# Patient Record
Sex: Female | Born: 1965 | Race: White | Hispanic: No | Smoking: Former smoker
Health system: Southern US, Community
[De-identification: ages and names within clinical notes are randomized; demographics above are authoritative.]

## PROBLEM LIST (undated history)

## (undated) DIAGNOSIS — F419 Anxiety disorder, unspecified: Secondary | ICD-10-CM

## (undated) DIAGNOSIS — J449 Chronic obstructive pulmonary disease, unspecified: Secondary | ICD-10-CM

## (undated) DIAGNOSIS — G43909 Migraine, unspecified, not intractable, without status migrainosus: Secondary | ICD-10-CM

## (undated) DIAGNOSIS — K219 Gastro-esophageal reflux disease without esophagitis: Secondary | ICD-10-CM

## (undated) DIAGNOSIS — F32A Depression, unspecified: Secondary | ICD-10-CM

## (undated) DIAGNOSIS — I10 Essential (primary) hypertension: Secondary | ICD-10-CM

## (undated) DIAGNOSIS — J45909 Unspecified asthma, uncomplicated: Secondary | ICD-10-CM

## (undated) DIAGNOSIS — F329 Major depressive disorder, single episode, unspecified: Secondary | ICD-10-CM

## (undated) DIAGNOSIS — C801 Malignant (primary) neoplasm, unspecified: Secondary | ICD-10-CM

## (undated) HISTORY — PX: ABDOMINAL HYSTERECTOMY: SHX81

## (undated) HISTORY — PX: OTHER SURGICAL HISTORY: SHX169

---

## 2015-07-08 IMAGING — CR DG ANKLE COMPLETE 3+V*R*
3 series · 3 of 3 positions shown · non-contrast
Comparison: None.

CLINICAL DATA: Patient fell yesterday.

EXAM:
RIGHT ANKLE - COMPLETE 3+ VIEW

[x ankle ap right]
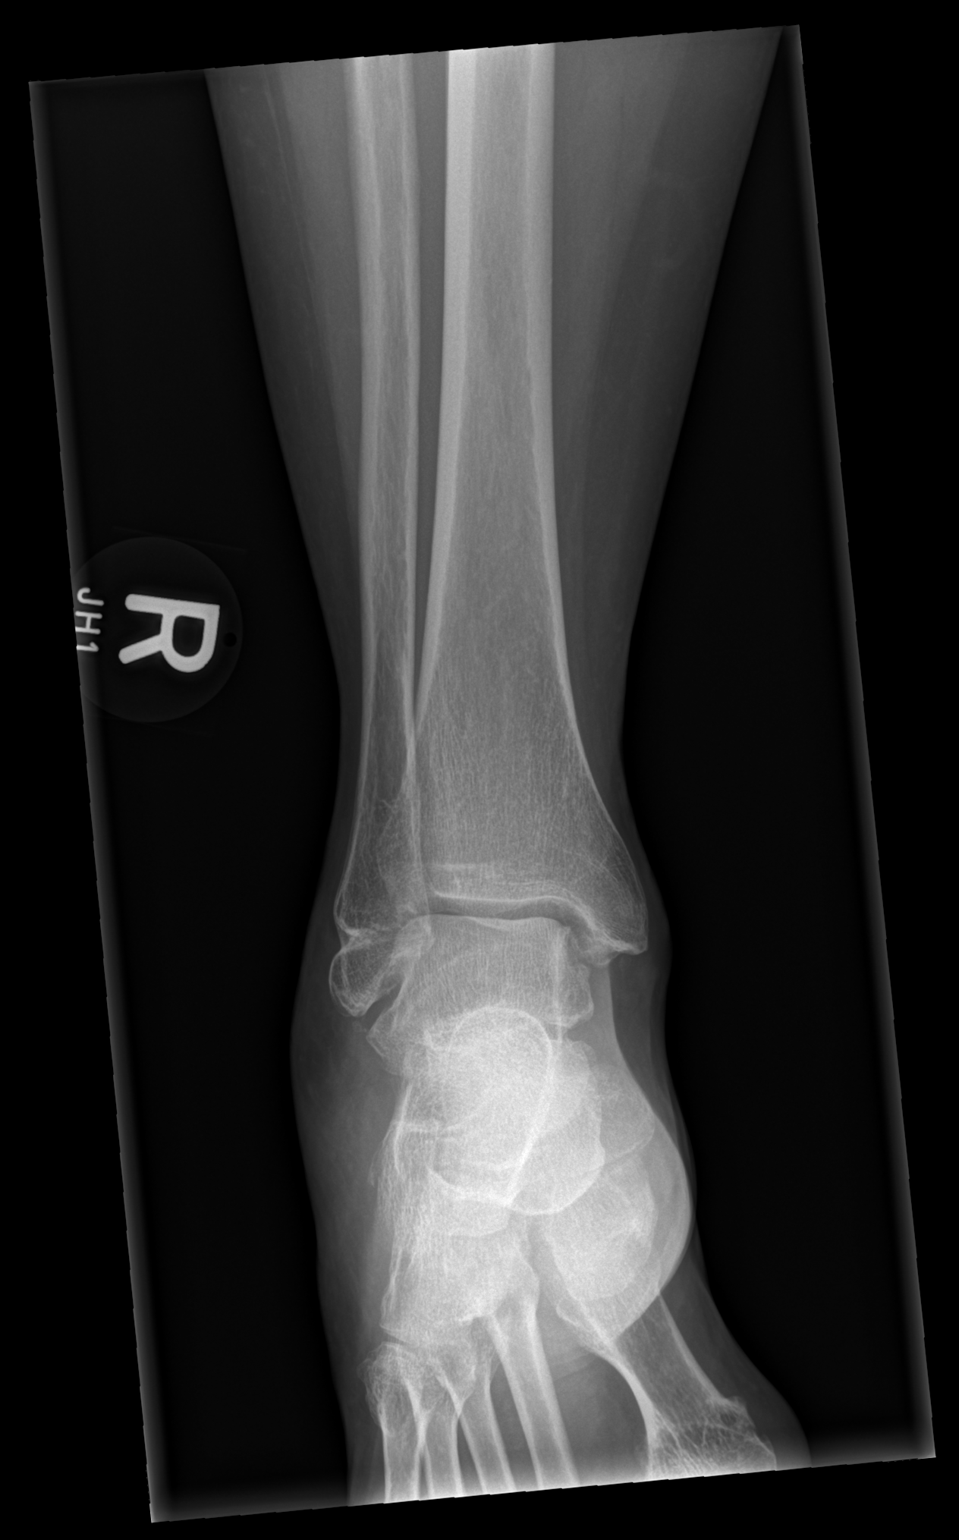

[x ankle obl right]
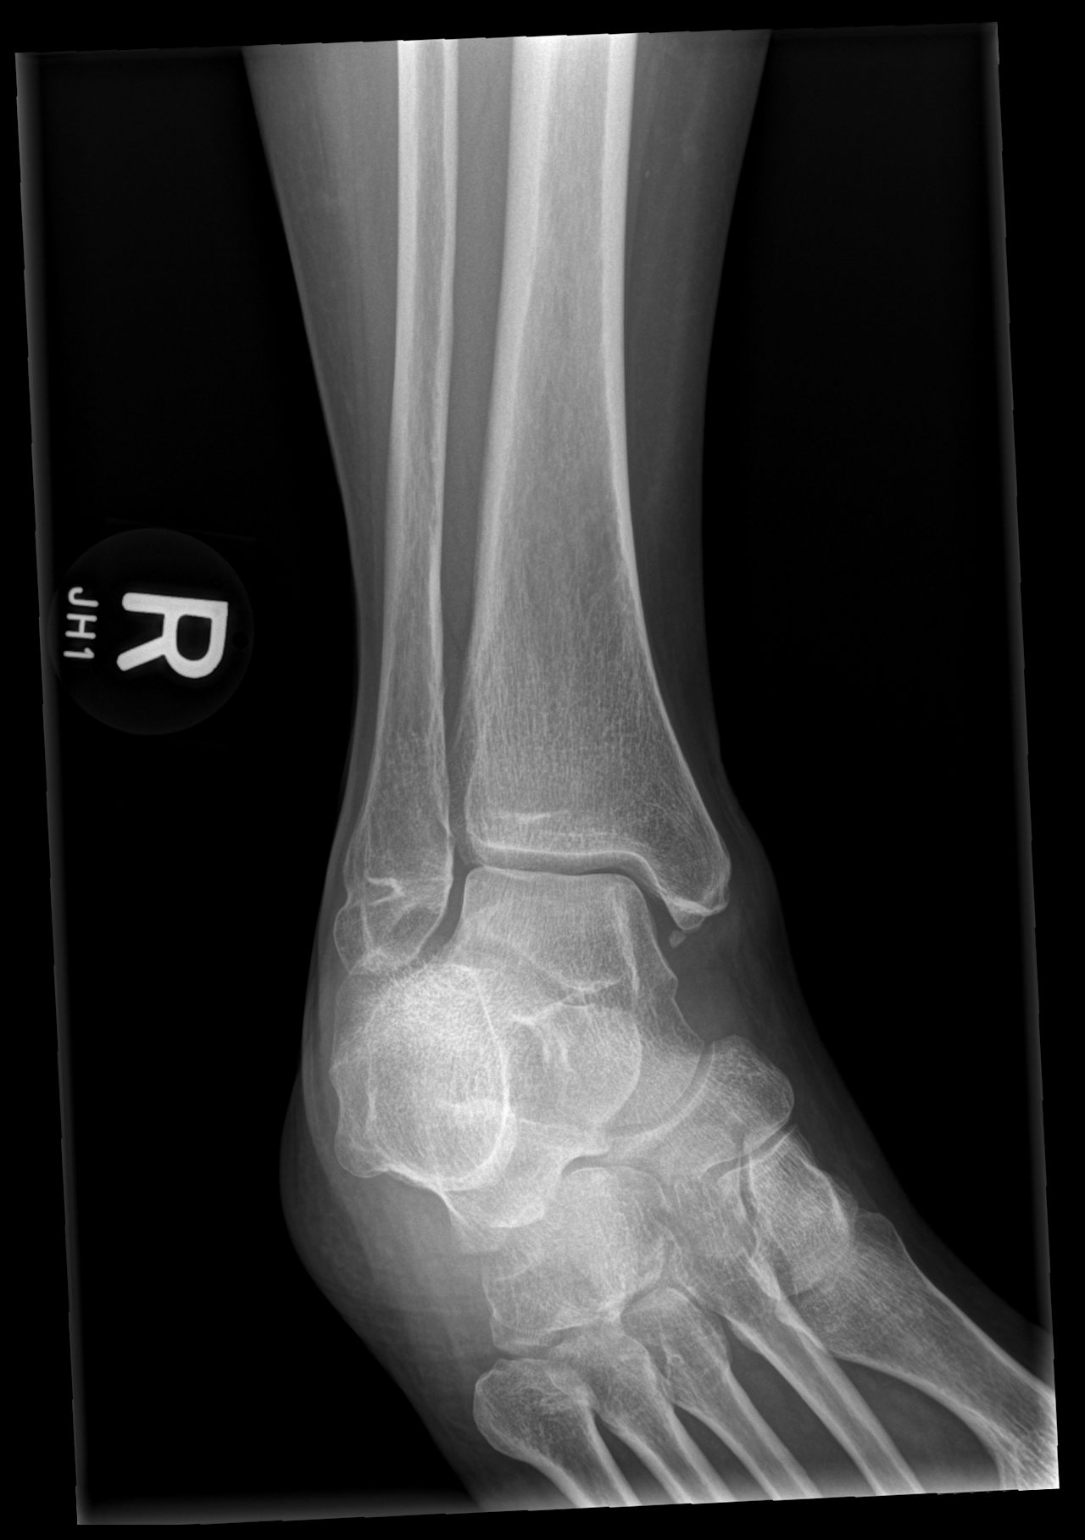

[x ankle lat right]
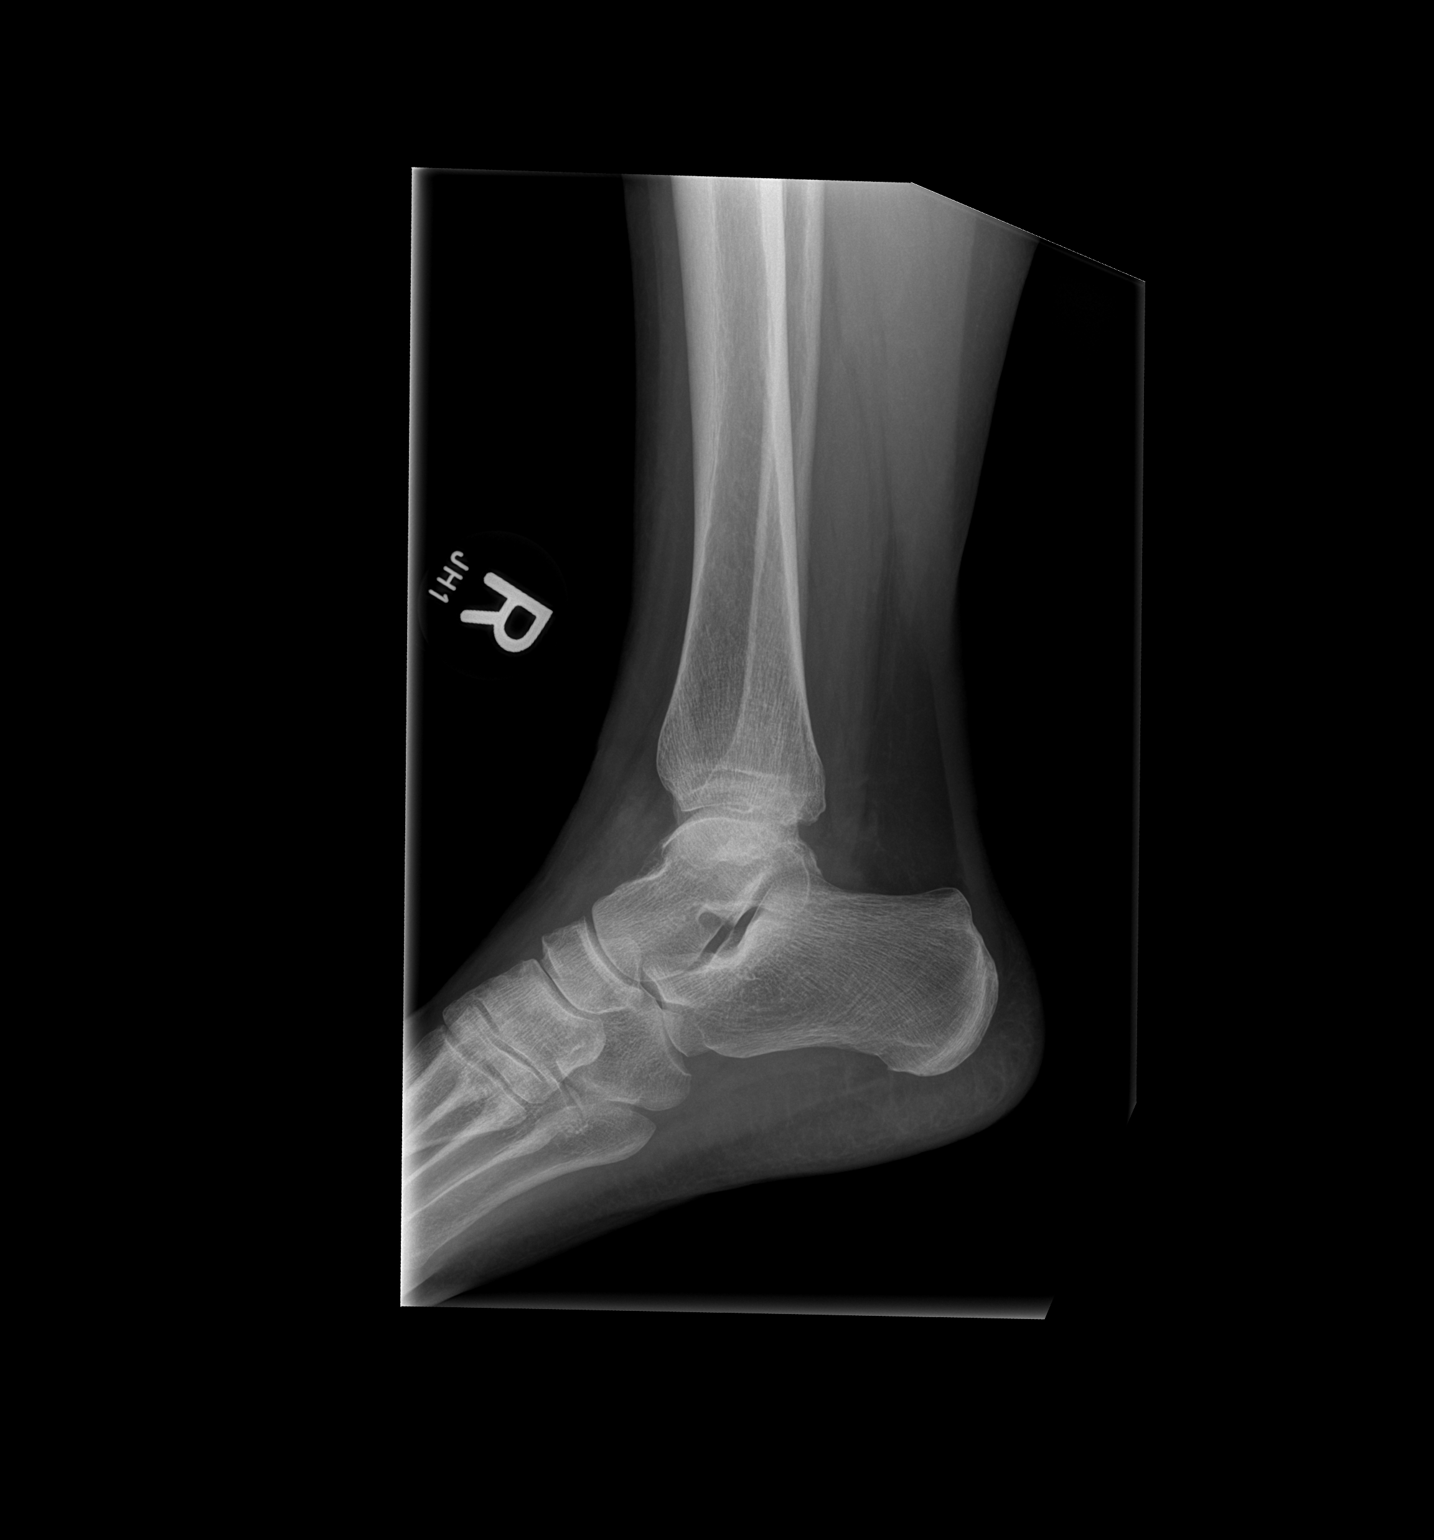

[3 of 3 positions shown; findings below may reference images not displayed]

FINDINGS: No evidence for an acute fracture. No subluxation or dislocation.
Deformity of the distal fibula suggests remote injury. Degenerative
changes are noted in the posterior tibiotalar joint.
IMPRESSION: No acute bony abnormality.

## 2016-01-23 ENCOUNTER — Inpatient Hospital Stay (HOSPITAL_COMMUNITY)
Admission: EM | Admit: 2016-01-23 | Discharge: 2016-01-27 | DRG: 194 | Disposition: A | Discharge status: Home / Self Care | Payer: Medicaid Other | Attending: Internal Medicine | Admitting: Internal Medicine

## 2016-01-23 ENCOUNTER — Encounter (HOSPITAL_COMMUNITY): Payer: Self-pay

## 2016-01-23 ENCOUNTER — Emergency Department (HOSPITAL_COMMUNITY): Payer: Medicaid Other

## 2016-01-23 DIAGNOSIS — F909 Attention-deficit hyperactivity disorder, unspecified type: Secondary | ICD-10-CM | POA: Diagnosis present

## 2016-01-23 DIAGNOSIS — E876 Hypokalemia: Secondary | ICD-10-CM | POA: Diagnosis present

## 2016-01-23 DIAGNOSIS — G43019 Migraine without aura, intractable, without status migrainosus: Secondary | ICD-10-CM | POA: Diagnosis not present

## 2016-01-23 DIAGNOSIS — J180 Bronchopneumonia, unspecified organism: Principal | ICD-10-CM | POA: Diagnosis present

## 2016-01-23 DIAGNOSIS — J189 Pneumonia, unspecified organism: Secondary | ICD-10-CM | POA: Diagnosis not present

## 2016-01-23 DIAGNOSIS — H6123 Impacted cerumen, bilateral: Secondary | ICD-10-CM | POA: Diagnosis present

## 2016-01-23 DIAGNOSIS — Z79899 Other long term (current) drug therapy: Secondary | ICD-10-CM

## 2016-01-23 DIAGNOSIS — C3490 Malignant neoplasm of unspecified part of unspecified bronchus or lung: Secondary | ICD-10-CM | POA: Diagnosis present

## 2016-01-23 DIAGNOSIS — H7293 Unspecified perforation of tympanic membrane, bilateral: Secondary | ICD-10-CM | POA: Diagnosis not present

## 2016-01-23 DIAGNOSIS — R509 Fever, unspecified: Secondary | ICD-10-CM

## 2016-01-23 DIAGNOSIS — C7931 Secondary malignant neoplasm of brain: Secondary | ICD-10-CM | POA: Diagnosis present

## 2016-01-23 DIAGNOSIS — Y95 Nosocomial condition: Secondary | ICD-10-CM | POA: Diagnosis present

## 2016-01-23 DIAGNOSIS — F329 Major depressive disorder, single episode, unspecified: Secondary | ICD-10-CM | POA: Diagnosis present

## 2016-01-23 DIAGNOSIS — F419 Anxiety disorder, unspecified: Secondary | ICD-10-CM | POA: Diagnosis present

## 2016-01-23 DIAGNOSIS — G43909 Migraine, unspecified, not intractable, without status migrainosus: Secondary | ICD-10-CM | POA: Diagnosis present

## 2016-01-23 DIAGNOSIS — J440 Chronic obstructive pulmonary disease with acute lower respiratory infection: Secondary | ICD-10-CM | POA: Diagnosis present

## 2016-01-23 DIAGNOSIS — I10 Essential (primary) hypertension: Secondary | ICD-10-CM | POA: Diagnosis present

## 2016-01-23 DIAGNOSIS — Z87891 Personal history of nicotine dependence: Secondary | ICD-10-CM | POA: Diagnosis not present

## 2016-01-23 DIAGNOSIS — K219 Gastro-esophageal reflux disease without esophagitis: Secondary | ICD-10-CM | POA: Diagnosis present

## 2016-01-23 HISTORY — DX: Chronic obstructive pulmonary disease, unspecified: J44.9

## 2016-01-23 HISTORY — DX: Essential (primary) hypertension: I10

## 2016-01-23 HISTORY — DX: Anxiety disorder, unspecified: F41.9

## 2016-01-23 HISTORY — DX: Malignant (primary) neoplasm, unspecified: C80.1

## 2016-01-23 HISTORY — DX: Major depressive disorder, single episode, unspecified: F32.9

## 2016-01-23 HISTORY — DX: Unspecified asthma, uncomplicated: J45.909

## 2016-01-23 HISTORY — DX: Gastro-esophageal reflux disease without esophagitis: K21.9

## 2016-01-23 HISTORY — DX: Depression, unspecified: F32.A

## 2016-01-23 LAB — COMPREHENSIVE METABOLIC PANEL
ALT: 14 U/L (ref 14–54)
AST: 17 U/L (ref 15–41)
Albumin: 3.7 g/dL (ref 3.5–5.0)
Alkaline Phosphatase: 72 U/L (ref 38–126)
Anion gap: 9 (ref 5–15)
BUN: 9 mg/dL (ref 6–20)
CO2: 24 mmol/L (ref 22–32)
Calcium: 8.1 mg/dL — ABNORMAL LOW (ref 8.9–10.3)
Chloride: 104 mmol/L (ref 101–111)
Creatinine, Ser: 0.75 mg/dL (ref 0.44–1.00)
GFR calc Af Amer: >60 mL/min (ref >60)
GFR calc non Af Amer: >60 mL/min (ref >60)
Glucose, Bld: 100 mg/dL — ABNORMAL HIGH (ref 65–99)
Potassium: 3.1 mmol/L — ABNORMAL LOW (ref 3.5–5.1)
Sodium: 137 mmol/L (ref 135–145)
Total Bilirubin: 0.5 mg/dL (ref 0.3–1.2)
Total Protein: 7.2 g/dL (ref 6.5–8.1)

## 2016-01-23 LAB — TROPONIN I: Troponin I: <0.03 ng/mL (ref <0.03)

## 2016-01-23 LAB — URINE MICROSCOPIC-ADD ON
Bacteria, UA: NONE SEEN
RBC / HPF: NONE SEEN RBC/hpf (ref 0–5)
WBC, UA: NONE SEEN WBC/hpf (ref 0–5)

## 2016-01-23 LAB — EXPECTORATED SPUTUM ASSESSMENT W REFEX TO RESP CULTURE

## 2016-01-23 LAB — CBC WITH DIFFERENTIAL/PLATELET
Basophils Absolute: 0 10*3/uL (ref 0.0–0.1)
Basophils Relative: 0 %
Eosinophils Absolute: 0 10*3/uL (ref 0.0–0.7)
Eosinophils Relative: 0 %
HCT: 42.6 % (ref 36.0–46.0)
Hemoglobin: 13.9 g/dL (ref 12.0–15.0)
Lymphocytes Relative: 8 %
Lymphs Abs: 0.7 10*3/uL (ref 0.7–4.0)
MCH: 31 pg (ref 26.0–34.0)
MCHC: 32.6 g/dL (ref 30.0–36.0)
MCV: 95.1 fL (ref 78.0–100.0)
Monocytes Absolute: 0.7 10*3/uL (ref 0.1–1.0)
Monocytes Relative: 8 %
Neutro Abs: 7.6 10*3/uL (ref 1.7–7.7)
Neutrophils Relative %: 84 %
Platelets: 295 10*3/uL (ref 150–400)
RBC: 4.48 MIL/uL (ref 3.87–5.11)
RDW: 13.2 % (ref 11.5–15.5)
WBC: 9 10*3/uL (ref 4.0–10.5)

## 2016-01-23 LAB — URINALYSIS, ROUTINE W REFLEX MICROSCOPIC
Bilirubin Urine: NEGATIVE
Glucose, UA: NEGATIVE mg/dL
Hgb urine dipstick: NEGATIVE
Ketones, ur: NEGATIVE mg/dL
Leukocytes, UA: NEGATIVE
Nitrite: NEGATIVE
Protein, ur: 30 mg/dL — AB
Specific Gravity, Urine: 1.022 (ref 1.005–1.030)
pH: 6.5 (ref 5.0–8.0)

## 2016-01-23 LAB — I-STAT CG4 LACTIC ACID, ED: Lactic Acid, Venous: 0.87 mmol/L (ref 0.5–1.9)

## 2016-01-23 IMAGING — CR DG CHEST 2V
2 series · 2 of 2 positions shown · non-contrast
Comparison: None.

CLINICAL DATA: Productive cough, fever and headache.

EXAM:
CHEST  2 VIEW

[w chest lat]
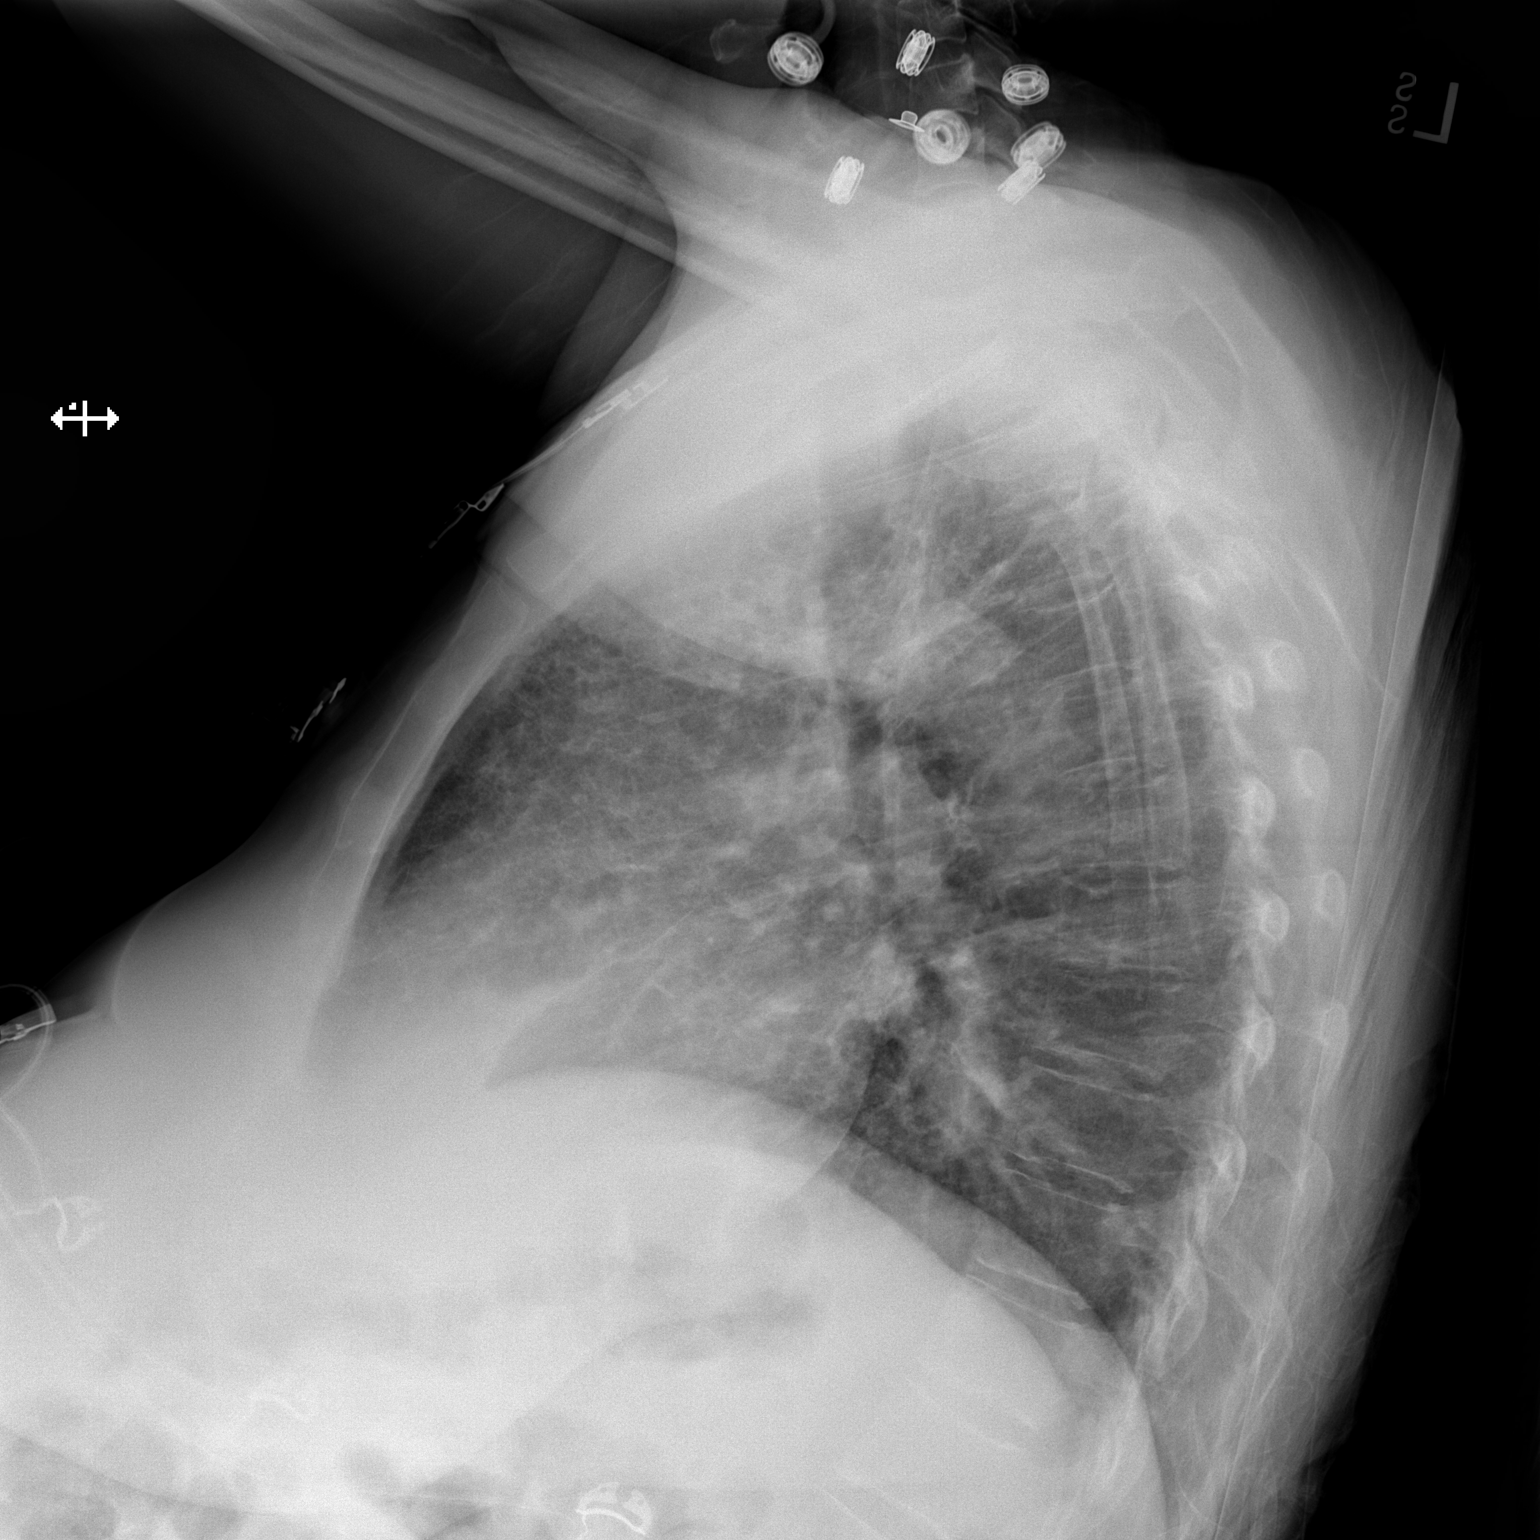

[x chest ap]
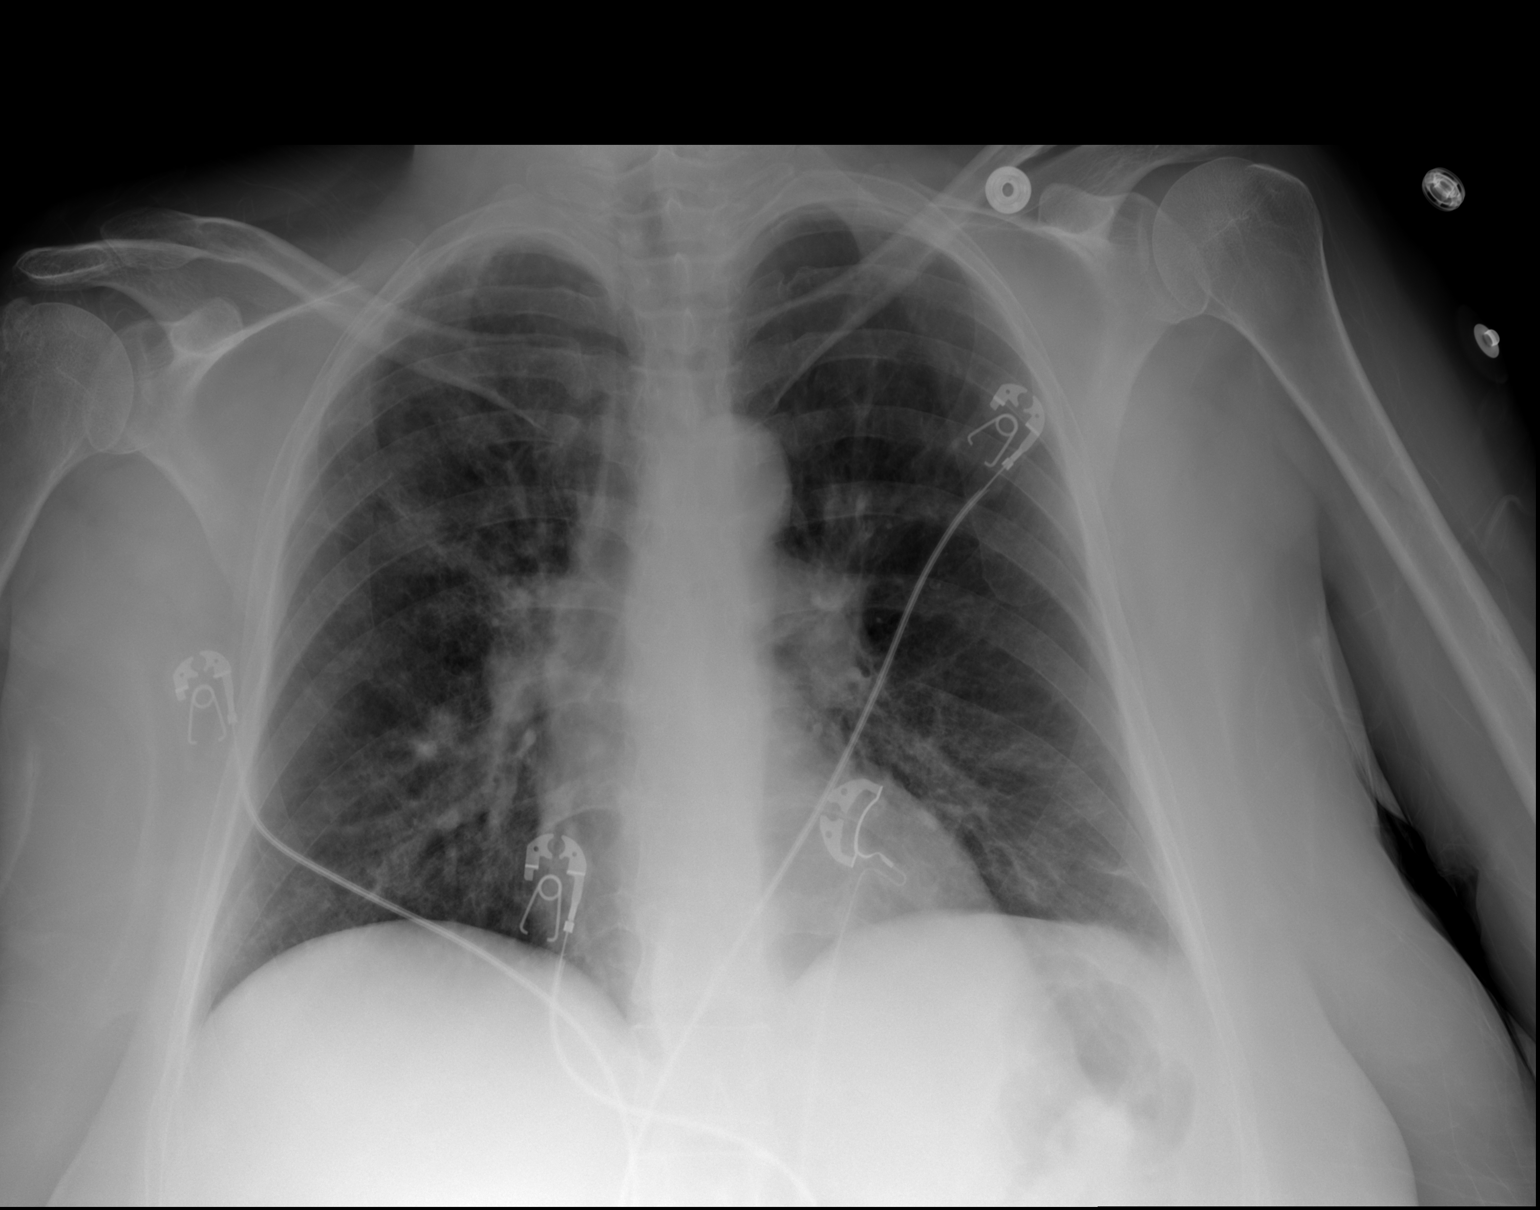

[2 of 2 positions shown; findings below may reference images not displayed]

FINDINGS: Heart size is normal. Mediastinal shadows are normal. There is
patchy bronchopneumonia throughout both lungs. No dense
consolidation or lobar collapse. No effusion. No acute bone finding.
IMPRESSION: Patchy bilateral bronchopneumonia.

## 2016-01-23 MED ORDER — PIPERACILLIN-TAZOBACTAM 3.375 G IVPB
3.3750 g | Freq: Three times a day (TID) | INTRAVENOUS | Status: DC
  Administered 2016-01-23 – 2016-01-27 (×11): 3.375 g via INTRAVENOUS
  Filled 2016-01-23 (×11): qty 50

## 2016-01-23 MED ORDER — DEXTROSE 5 % IV SOLN
1.0000 g | Freq: Three times a day (TID) | INTRAVENOUS | Status: DC
  Filled 2016-01-23 (×2): qty 1

## 2016-01-23 MED ORDER — SODIUM CHLORIDE 0.9 % IV BOLUS (SEPSIS)
500.0000 mL | Freq: Once | INTRAVENOUS | Status: AC
  Administered 2016-01-23: 500 mL via INTRAVENOUS

## 2016-01-23 MED ORDER — IBUPROFEN 200 MG PO TABS
600.0000 mg | ORAL_TABLET | Freq: Once | ORAL | Status: AC
  Administered 2016-01-23: 600 mg via ORAL
  Filled 2016-01-23: qty 3

## 2016-01-23 MED ORDER — FENTANYL CITRATE (PF) 100 MCG/2ML IJ SOLN
50.0000 ug | Freq: Once | INTRAMUSCULAR | Status: AC
  Administered 2016-01-23: 50 ug via INTRAVENOUS
  Filled 2016-01-23: qty 2

## 2016-01-23 MED ORDER — DM-GUAIFENESIN ER 30-600 MG PO TB12: 1.0000 | ORAL_TABLET | Freq: Two times a day (BID) | ORAL | Status: DC | PRN

## 2016-01-23 MED ORDER — METOPROLOL SUCCINATE ER 25 MG PO TB24
25.0000 mg | ORAL_TABLET | Freq: Every day | ORAL | Status: DC
  Administered 2016-01-23 – 2016-01-27 (×5): 25 mg via ORAL
  Filled 2016-01-23 (×5): qty 1

## 2016-01-23 MED ORDER — LORAZEPAM 0.5 MG PO TABS
0.5000 mg | ORAL_TABLET | Freq: Four times a day (QID) | ORAL | Status: DC | PRN
  Administered 2016-01-24 – 2016-01-26 (×4): 0.5 mg via ORAL
  Filled 2016-01-23 (×4): qty 1

## 2016-01-23 MED ORDER — ALBUTEROL SULFATE (2.5 MG/3ML) 0.083% IN NEBU
2.5000 mg | INHALATION_SOLUTION | RESPIRATORY_TRACT | Status: DC | PRN
  Administered 2016-01-23 – 2016-01-24 (×2): 2.5 mg via RESPIRATORY_TRACT
  Filled 2016-01-23 (×2): qty 3

## 2016-01-23 MED ORDER — BUPROPION HCL ER (SR) 100 MG PO TB12
200.0000 mg | ORAL_TABLET | Freq: Two times a day (BID) | ORAL | Status: DC
  Administered 2016-01-23 – 2016-01-27 (×8): 200 mg via ORAL
  Filled 2016-01-23 (×10): qty 2

## 2016-01-23 MED ORDER — OSELTAMIVIR PHOSPHATE 75 MG PO CAPS
75.0000 mg | ORAL_CAPSULE | Freq: Every day | ORAL | Status: DC
  Administered 2016-01-23: 75 mg via ORAL
  Filled 2016-01-23: qty 1

## 2016-01-23 MED ORDER — OXYCODONE-ACETAMINOPHEN 5-325 MG PO TABS
2.0000 | ORAL_TABLET | ORAL | Status: DC | PRN
  Administered 2016-01-23 – 2016-01-27 (×17): 2 via ORAL
  Filled 2016-01-23 (×17): qty 2

## 2016-01-23 MED ORDER — VANCOMYCIN HCL 10 G IV SOLR
1250.0000 mg | Freq: Two times a day (BID) | INTRAVENOUS | Status: DC
  Administered 2016-01-24 – 2016-01-25 (×4): 1250 mg via INTRAVENOUS
  Filled 2016-01-23: qty 500
  Filled 2016-01-23 (×3): qty 1250

## 2016-01-23 MED ORDER — ENOXAPARIN SODIUM 40 MG/0.4ML ~~LOC~~ SOLN
40.0000 mg | SUBCUTANEOUS | Status: DC
  Administered 2016-01-23 – 2016-01-26 (×4): 40 mg via SUBCUTANEOUS
  Filled 2016-01-23 (×4): qty 0.4

## 2016-01-23 MED ORDER — SODIUM CHLORIDE 0.9 % IV BOLUS (SEPSIS)
1000.0000 mL | Freq: Once | INTRAVENOUS | Status: AC
  Administered 2016-01-23: 1000 mL via INTRAVENOUS

## 2016-01-23 MED ORDER — POTASSIUM CHLORIDE CRYS ER 20 MEQ PO TBCR
40.0000 meq | EXTENDED_RELEASE_TABLET | Freq: Once | ORAL | Status: AC
  Administered 2016-01-23: 40 meq via ORAL
  Filled 2016-01-23: qty 2

## 2016-01-23 MED ORDER — ONDANSETRON HCL 4 MG/2ML IJ SOLN
4.0000 mg | Freq: Once | INTRAMUSCULAR | Status: AC
  Administered 2016-01-23: 4 mg via INTRAVENOUS
  Filled 2016-01-23: qty 2

## 2016-01-23 MED ORDER — PIPERACILLIN-TAZOBACTAM 3.375 G IVPB: 3.3750 g | Freq: Three times a day (TID) | INTRAVENOUS | Status: DC

## 2016-01-23 MED ORDER — MONTELUKAST SODIUM 10 MG PO TABS
10.0000 mg | ORAL_TABLET | Freq: Every day | ORAL | Status: DC
  Administered 2016-01-23 – 2016-01-27 (×5): 10 mg via ORAL
  Filled 2016-01-23 (×5): qty 1

## 2016-01-23 MED ORDER — VANCOMYCIN HCL IN DEXTROSE 1-5 GM/200ML-% IV SOLN
1000.0000 mg | Freq: Once | INTRAVENOUS | Status: AC
  Administered 2016-01-23: 1000 mg via INTRAVENOUS
  Filled 2016-01-23 (×2): qty 200

## 2016-01-23 MED ORDER — OXYCODONE-ACETAMINOPHEN 5-325 MG PO TABS
2.0000 | ORAL_TABLET | Freq: Four times a day (QID) | ORAL | Status: DC | PRN
  Administered 2016-01-23: 2 via ORAL
  Filled 2016-01-23: qty 2

## 2016-01-23 MED ORDER — PIPERACILLIN-TAZOBACTAM 3.375 G IVPB 30 MIN
3.3750 g | Freq: Once | INTRAVENOUS | Status: AC
  Administered 2016-01-23: 3.375 g via INTRAVENOUS
  Filled 2016-01-23: qty 50

## 2016-01-23 MED ORDER — VANCOMYCIN HCL 10 G IV SOLR: 1250.0000 mg | Freq: Two times a day (BID) | INTRAVENOUS | Status: DC

## 2016-01-23 MED ORDER — PANTOPRAZOLE SODIUM 40 MG PO TBEC
40.0000 mg | DELAYED_RELEASE_TABLET | Freq: Every day | ORAL | Status: DC
  Administered 2016-01-23 – 2016-01-27 (×5): 40 mg via ORAL
  Filled 2016-01-23 (×5): qty 1

## 2016-01-23 NOTE — ED Provider Notes (Signed)
[facility] DEPT Provider Note   CSN: [id number] Arrival date & time: 01/23/16  1153     History   Chief Complaint Chief Complaint  Patient presents with  . ca patient  . URI    HPI Ashley Mayo is a 50 y.o. female.  50 year old female with past medical history including lung cancer on chemotherapy, COPD, hypertension who presents with cough, fever, and malaise. 4 days ago, the patient began having a productive cough associated with intermittent fevers, headache, right ear pain, and chest pain with coughing. She reports mild progressive shortness of breath. She notes that a few family members were sick with a cough but no one had severe symptoms like hers. She last took Tylenol and Percocet this morning. No abdominal pain, vomiting, or diarrhea. No rash. She receives her care in [place], [place] and is currently visiting here for the holidays.   The history is provided by the patient.    Past Medical History:  Diagnosis Date  . Anxiety   . Asthma   . Cancer ([place])    lung cancer  . COPD (chronic obstructive pulmonary disease) ([place])   . Depression   . GERD (gastroesophageal reflux disease)   . Hypertension     Patient Active Problem List   Diagnosis Date Noted  . Pneumonia 01/23/2016    Past Surgical History:  Procedure Laterality Date  . ABDOMINAL HYSTERECTOMY    . CESAREAN SECTION    . ORIF right hip fracture      OB History    No data available       Home Medications    Prior to Admission medications   Medication Sig Start Date End Date Taking? Authorizing Provider  acetaminophen (TYLENOL) 500 MG tablet Take 1,000 mg by mouth every 6 (six) hours as needed for mild pain, moderate pain, fever or headache.   Yes Historical Provider, MD  ADDERALL XR 30 MG 24 hr capsule Take 30 mg by mouth daily. 01/08/16  Yes Historical Provider, MD  ADVAIR DISKUS 500-50 MCG/DOSE AEPB Take 1 puff by mouth 2 (two) times daily as needed (for shortness of breath).   12/20/15  Yes Historical Provider, MD  albuterol (PROVENTIL) (2.5 MG/3ML) 0.083% nebulizer solution Take 2.5 mg by nebulization every 3 (three) hours as needed for wheezing or shortness of breath.   Yes Historical Provider, MD  amphetamine-dextroamphetamine (ADDERALL) 10 MG tablet Take 20 mg by mouth daily. 01/08/16  Yes Historical Provider, MD  buPROPion (WELLBUTRIN SR) 200 MG 12 hr tablet Take 200 mg by mouth 2 (two) times daily.   Yes Historical Provider, MD  dextromethorphan-guaiFENesin (MUCINEX DM) 30-600 MG 12hr tablet Take 1 tablet by mouth 2 (two) times daily as needed for cough.   Yes Historical Provider, MD  lidocaine (LIDODERM) 5 % Place 1-2 patches onto the skin daily. 01/13/16  Yes Historical Provider, MD  LORazepam (ATIVAN) 0.5 MG tablet Take 0.5 mg by mouth every 6 (six) hours as needed for anxiety. 12/26/15  Yes Historical Provider, MD  methylPREDNISolone (MEDROL DOSEPAK) 4 MG TBPK tablet Take by mouth as directed. 01/07/16  Yes Historical Provider, MD  metoprolol succinate (TOPROL-XL) 25 MG 24 hr tablet Take 25 mg by mouth daily.   Yes Historical Provider, MD  montelukast (SINGULAIR) 10 MG tablet Take 10 mg by mouth daily.   Yes Historical Provider, MD  nystatin (MYCOSTATIN) 100000 UNIT/ML suspension Take 5 mLs by mouth 4 (four) times daily as needed (for thrush).  11/01/15  Yes Historical Provider, MD  oxyCODONE-acetaminophen (PERCOCET/ROXICET) 5-325 MG tablet Take 2 tablets by mouth every 4 (four) hours as needed for pain. 12/26/15  Yes Historical Provider, MD  pantoprazole (PROTONIX) 40 MG tablet Take 40 mg by mouth daily. 01/06/16  Yes Historical Provider, MD  pembrolizumab (KEYTRUDA) 100 MG/4ML SOLN Inject 2 mg/kg into the vein every 21 ( twenty-one) days.   Yes Historical Provider, MD  PROAIR HFA 108 [phone] Base) MCG/ACT inhaler Take 2 puffs by mouth 2 (two) times daily as needed for wheezing or shortness of breath. 01/10/16  Yes Historical Provider, MD  valACYclovir (VALTREX) 500 MG  tablet Take 500 mg by mouth 2 (two) times daily as needed (for cold sores).  11/19/15  Yes Historical Provider, MD    Family History History reviewed. No pertinent family history.  Social History Social History  Substance Use Topics  . Smoking status: Former Smoker    Packs/day: 0.25    Years: 35.00    Types: Cigarettes  . Smokeless tobacco: Never Used  . Alcohol use No     Allergies   Codeine and Shellfish allergy   Review of Systems Review of Systems 10 Systems reviewed and are negative for acute change except as noted in the HPI.   Physical Exam Updated Vital Signs BP 108/65   Pulse 110   Temp 102 F (38.9 C) (Oral)   Resp 16   Ht '5\' 4"'$  (1.626 m)   Wt 180 lb (81.6 kg)   SpO2 95%   BMI 30.90 kg/m   Physical Exam  Constitutional: She is oriented to person, place, and time. She appears well-developed and well-nourished. No distress.  uncomfortable  HENT:  Head: Normocephalic and atraumatic.  Mouth/Throat: Oropharynx is clear and moist.  Moist mucous membranes  Eyes: Conjunctivae are normal. Pupils are equal, round, and reactive to light.  Neck: Neck supple.  Cardiovascular: Regular rhythm and normal heart sounds.  Tachycardia present.   No murmur heard. Pulmonary/Chest: Effort normal.  Rhonchi b/l  Abdominal: Soft. Bowel sounds are normal. She exhibits no distension. There is no tenderness.  Musculoskeletal: She exhibits no edema.  Neurological: She is alert and oriented to person, place, and time.  Fluent speech  Skin: Skin is warm and dry.  Psychiatric: She has a normal mood and affect. Judgment normal.  Nursing note and vitals reviewed.    ED Treatments / Results  Labs (all labs ordered are listed, but only abnormal results are displayed) Labs Reviewed  COMPREHENSIVE METABOLIC PANEL - Abnormal; Notable for the following:       Result Value   Potassium 3.1 (*)    Glucose, Bld 100 (*)    Calcium 8.1 (*)    All other components within normal  limits  URINALYSIS, ROUTINE W REFLEX MICROSCOPIC (NOT AT [facility]) - Abnormal; Notable for the following:    Protein, ur 30 (*)    All other components within normal limits  URINE MICROSCOPIC-ADD ON - Abnormal; Notable for the following:    Squamous Epithelial / LPF 6-30 (*)    All other components within normal limits  CULTURE, BLOOD (ROUTINE X 2)  CULTURE, BLOOD (ROUTINE X 2)  URINE CULTURE  RESPIRATORY PANEL BY PCR  CBC WITH DIFFERENTIAL/PLATELET  TROPONIN I  CREATININE, SERUM  I-STAT CG4 LACTIC ACID, ED    EKG  EKG Interpretation  Date/Time:  Thursday January 23 2016 12:38:03 EST Ventricular Rate:  113 PR Interval:    QRS Duration: 85 QT Interval:  294 QTC Calculation: 403 R Axis:  43 Text Interpretation:  Sinus tachycardia Left atrial enlargement Borderline repolarization abnormality diffuse T wave inversions and ST depression, no previous EKG available Confirmed by Haily Caley MD, Isolde Skaff [phone]) on 01/23/2016 1:48:25 PM       Radiology Dg Chest 2 View  Result Date: 01/23/2016 CLINICAL DATA:  Productive cough, fever and headache. EXAM: CHEST  2 VIEW COMPARISON:  None. FINDINGS: Heart size is normal. Mediastinal shadows are normal. There is patchy bronchopneumonia throughout both lungs. No dense consolidation or lobar collapse. No effusion. No acute bone finding. IMPRESSION: Patchy bilateral bronchopneumonia. Electronically Signed   By: Nelson Chimes M.D.   On: 01/23/2016 13:53    Procedures .Critical Care Performed by: Sharlett Iles Authorized by: Sharlett Iles   Critical care provider statement:    Critical care time (minutes):  35   Critical care time was exclusive of:  Separately billable procedures and treating other patients   Critical care was necessary to treat or prevent imminent or life-threatening deterioration of the following conditions:  Sepsis   Critical care was time spent personally by me on the following activities:  Development of  treatment plan with patient or surrogate, evaluation of patient's response to treatment, examination of patient, obtaining history from patient or surrogate, ordering and performing treatments and interventions, ordering and review of laboratory studies, ordering and review of radiographic studies and re-evaluation of patient's condition   (including critical care time)  Medications Ordered in ED Medications  oseltamivir (TAMIFLU) capsule 75 mg (75 mg Oral Given 01/23/16 1414)  piperacillin-tazobactam (ZOSYN) IVPB 3.375 g (not administered)  vancomycin (VANCOCIN) 1,250 mg in sodium chloride 0.9 % 250 mL IVPB (not administered)  oxyCODONE-acetaminophen (PERCOCET/ROXICET) 5-325 MG per tablet 2 tablet (not administered)  sodium chloride 0.9 % bolus 1,000 mL (0 mLs Intravenous Stopped 01/23/16 1447)    And  sodium chloride 0.9 % bolus 1,000 mL (0 mLs Intravenous Stopped 01/23/16 1709)    And  sodium chloride 0.9 % bolus 500 mL (0 mLs Intravenous Stopped 01/23/16 1709)  piperacillin-tazobactam (ZOSYN) IVPB 3.375 g (0 g Intravenous Stopped 01/23/16 1417)  vancomycin (VANCOCIN) IVPB 1000 mg/200 mL premix (0 mg Intravenous Stopped 01/23/16 1545)  ondansetron (ZOFRAN) injection 4 mg (4 mg Intravenous Given 01/23/16 1332)  ibuprofen (ADVIL,MOTRIN) tablet 600 mg (600 mg Oral Given 01/23/16 1439)  fentaNYL (SUBLIMAZE) injection 50 mcg (50 mcg Intravenous Given 01/23/16 1439)     Initial Impression / Assessment and Plan / ED Course  I have reviewed the triage vital signs and the nursing notes.  Pertinent labs & imaging results that were available during my care of the patient were reviewed by me and considered in my medical decision making (see chart for details).  Clinical Course    PT w/ lung cancer on chemo p/w 4 days of URI sx Including cough, malaise, headache, and intermittent fevers. She spiked a fever shortly after arrival and I initiated a code sepsis with IV fluids, blood and urine  cultures, vancomycin and Zosyn. Her vital signs were otherwise stable with no hypotension. No respiratory distress on exam. No rash. Initial lactate was normal. I also gave Tamiflu and sent respiratory viral panel as differential includes influenza.  CBC, BMP and troponin are reassuring. EKG with some T-wave inversions and ST depressions diffusely, we have no previous available for comparison. Patient without ongoing chest pain, she states it mainly occurs when she is coughing. Chest x-ray shows bilateral bronchopneumonia. Patient's vital signs have remained stable on reexamination. Discussed admission with [facility], Dr. Lonny Prude, appreciate assistance of the patient's care. Patient admitted for further treatment. Final Clinical Impressions(s) / ED Diagnoses   Final diagnoses:  Healthcare-associated pneumonia  Fever, unspecified fever cause    New Prescriptions New Prescriptions   No medications on file     Sharlett Iles, MD 01/23/16 [phone]

## 2016-01-23 NOTE — Progress Notes (Signed)
Pharmacy Antibiotic Note  Ashley Mayo is a 50 y.o. female admitted on 01/23/2016 with  Pharmacy has been consulted for dosing of vancomycin and Zosyn for r/o pulmonary infection.   Patient received vancomycin 1 gram and Zosyn 3.375 grams in ED  Plan:  1. Vancomycin '1250mg'$  IV q12h.    (Opted for this maintenance regimen rather than nomogram dosage of 1 gram IV q8h due to increased risk of nephrotoxicity in setting of concomitant Zosyn) 2. Zosyn 3.375 grams IV q8h 3. Follow renal function, culture results, clinical course 4. Consider de-escalation of empiric regimen when feasible   Height: '5\' 4"'$  (162.6 cm) Weight: 180 lb (81.6 kg) IBW/kg (Calculated) : 54.7  Temp (24hrs), Avg:99.4 F (37.4 C), Min:99.4 F (37.4 C), Max:99.4 F (37.4 C)   Recent Labs Lab 01/23/16 1300 01/23/16 1308  WBC 9.0  --   CREATININE 0.75  --   LATICACIDVEN  --  0.87    Estimated Creatinine Clearance: 87 mL/min (by C-G formula based on SCr of 0.75 mg/dL).    Allergies  Allergen Reactions  . Codeine Hives    Antimicrobials this admission: 11/30 >> Zosyn 11/30 >> vancomycin 11/30 >> Tamiflu x 1    Dose adjustments this admission: --  Microbiology results: 11/30 BCx x2: collected 11/30 UCx: ordered 11/30 Resp panel by PCR: ordered   Thank you for allowing pharmacy to be a part of this patient's care.  Clayburn Pert, PharmD, BCPS Pager: [phone] 01/23/2016  2:13 PM

## 2016-01-23 NOTE — ED Triage Notes (Signed)
Per EMS, pt is from home with complaints productive cough, fever, headache, ear drainage, nausea, and pain with coughing. Pt received '4mg'$  zofran intravenously by EMS. Pt is currently being treated for lung cancer. Last tx was 3 weeks ago.

## 2016-01-23 NOTE — ED Notes (Signed)
Bed: WA17 Expected date: 01/23/16 Expected time: 11:48 AM Means of arrival: Ambulance Comments: [place] pt congestion, fever, green secretions

## 2016-01-23 NOTE — H&P (Signed)
History and Physical    Ashley Mayo EXH:[id number] DOB: 07-Mar-1965 DOA: 01/23/2016  PCP: No primary care provider on file.  Patient coming from: Home  Chief Complaint: Dyspnea  HPI: Ashley Mayo is a 50 y.o. female with medical history significant of anxiety, depression, metastatic lung cancer, hypertension. Three days ago, patient describes developing a cough. Symptoms worsened with developing sputum, fevers, chills, and diaphoresis. She had associated head congestion and reports seeing discharge from her right ear. She took Mucinex to help with getting phlegm up and benadryl for her head congestion. Mucinex helped. She was also taking prednisone for some wheezing recently. She receives her medical care in [place] and is down in [place] for the recent holidays.  ED Course: Vitals: Febrile to 102 degrees farenheit. Slightly tachycardic to 110s. Normotensive. On room air. Labs: Potassium of 3.1. WBC of 9K Imaging: Chest x-ray significant for bilateral pneumonia Medications/Course: Vanc/zosyn; 2.5L NS bolus  Review of Systems: Review of Systems  Constitutional: Positive for chills, diaphoresis, fever and malaise/fatigue.  HENT: Positive for ear discharge.   Respiratory: Positive for cough, sputum production, shortness of breath and wheezing. Negative for hemoptysis.   Cardiovascular: Positive for chest pain (chronic). Negative for palpitations, leg swelling and PND.  Gastrointestinal: Positive for constipation and nausea. Negative for abdominal pain, diarrhea and vomiting.  All other systems reviewed and are negative.   Past Medical History:  Diagnosis Date  . Anxiety   . Asthma   . Cancer ([place])    lung cancer  . COPD (chronic obstructive pulmonary disease) ([place])   . Depression   . GERD (gastroesophageal reflux disease)   . Hypertension     Past Surgical History:  Procedure Laterality Date  . ABDOMINAL HYSTERECTOMY    . CESAREAN SECTION    . ORIF right hip  fracture       reports that she has quit smoking. Her smoking use included Cigarettes. She has a 8.75 pack-year smoking history. She has never used smokeless tobacco. She reports that she does not drink alcohol or use drugs.  Allergies  Allergen Reactions  . Codeine Hives  . Shellfish Allergy Hives    Family History  Problem Relation Age of Onset  . Breast cancer Mother     Prior to Admission medications   Medication Sig Start Date End Date Taking? Authorizing Provider  acetaminophen (TYLENOL) 500 MG tablet Take 1,000 mg by mouth every 6 (six) hours as needed for mild pain, moderate pain, fever or headache.   Yes Historical Provider, MD  ADDERALL XR 30 MG 24 hr capsule Take 30 mg by mouth daily. 01/08/16  Yes Historical Provider, MD  ADVAIR DISKUS 500-50 MCG/DOSE AEPB Take 1 puff by mouth 2 (two) times daily as needed (for shortness of breath).  12/20/15  Yes Historical Provider, MD  albuterol (PROVENTIL) (2.5 MG/3ML) 0.083% nebulizer solution Take 2.5 mg by nebulization every 3 (three) hours as needed for wheezing or shortness of breath.   Yes Historical Provider, MD  amphetamine-dextroamphetamine (ADDERALL) 10 MG tablet Take 20 mg by mouth daily. 01/08/16  Yes Historical Provider, MD  buPROPion (WELLBUTRIN SR) 200 MG 12 hr tablet Take 200 mg by mouth 2 (two) times daily.   Yes Historical Provider, MD  dextromethorphan-guaiFENesin (MUCINEX DM) 30-600 MG 12hr tablet Take 1 tablet by mouth 2 (two) times daily as needed for cough.   Yes Historical Provider, MD  lidocaine (LIDODERM) 5 % Place 1-2 patches onto the skin daily. 01/13/16  Yes Historical Provider, MD  LORazepam (  ATIVAN) 0.5 MG tablet Take 0.5 mg by mouth every 6 (six) hours as needed for anxiety. 12/26/15  Yes Historical Provider, MD  methylPREDNISolone (MEDROL DOSEPAK) 4 MG TBPK tablet Take by mouth as directed. 01/07/16  Yes Historical Provider, MD  metoprolol succinate (TOPROL-XL) 25 MG 24 hr tablet Take 25 mg by mouth daily.    Yes Historical Provider, MD  montelukast (SINGULAIR) 10 MG tablet Take 10 mg by mouth daily.   Yes Historical Provider, MD  nystatin (MYCOSTATIN) 100000 UNIT/ML suspension Take 5 mLs by mouth 4 (four) times daily as needed (for thrush).  11/01/15  Yes Historical Provider, MD  oxyCODONE-acetaminophen (PERCOCET/ROXICET) 5-325 MG tablet Take 2 tablets by mouth every 4 (four) hours as needed for pain. 12/26/15  Yes Historical Provider, MD  pantoprazole (PROTONIX) 40 MG tablet Take 40 mg by mouth daily. 01/06/16  Yes Historical Provider, MD  pembrolizumab (KEYTRUDA) 100 MG/4ML SOLN Inject 2 mg/kg into the vein every 21 ( twenty-one) days.   Yes Historical Provider, MD  PROAIR HFA 108 [phone] Base) MCG/ACT inhaler Take 2 puffs by mouth 2 (two) times daily as needed for wheezing or shortness of breath. 01/10/16  Yes Historical Provider, MD  valACYclovir (VALTREX) 500 MG tablet Take 500 mg by mouth 2 (two) times daily as needed (for cold sores).  11/19/15  Yes Historical Provider, MD    Physical Exam: Vitals:   01/23/16 1211 01/23/16 1414 01/23/16 1415 01/23/16 1600  BP:  118/86  108/65  Pulse:  (!) 123  110  Resp:  22  16  Temp:   102 F (38.9 C)   TempSrc:   Oral   SpO2:  94%  95%  Weight: 81.6 kg (180 lb)     Height: '5\' 4"'$  (1.626 m)        Constitutional: NAD, calm, comfortable Eyes: PERRL, lids and conjunctivae normal ENMT: Mucous membranes are moist. Posterior pharynx clear of any exudate or lesions. Left TM is perforated. Right TM is inflammed Neck: normal, supple, no masses, no thyromegaly Respiratory: Crackles and decreased breath sounds bilaterally in mid to lower lung fields. Normal respiratory effort. No accessory muscle use.  Cardiovascular: Regular rate and rhythm, no murmurs / rubs / gallops. No extremity edema. 2+ pedal pulses. No carotid bruits.  Abdomen: no tenderness, no masses palpated. Bowel sounds positive.  Musculoskeletal: no clubbing / cyanosis. No joint deformity upper and  lower extremities. Good ROM, no contractures. Normal muscle tone.  Skin: no rashes, lesions, ulcers. No induration Neurologic: CN 2-12 grossly intact. Sensation intact, DTR normal. Strength 5/5 in all 4.  Psychiatric: Normal judgment and insight. Alert and oriented x 3. Normal mood.    Labs on Admission: I have personally reviewed following labs and imaging studies  CBC:  Recent Labs Lab 01/23/16 1300  WBC 9.0  NEUTROABS 7.6  HGB 13.9  HCT 42.6  MCV 95.1  PLT 308   Basic Metabolic Panel:  Recent Labs Lab 01/23/16 1300  NA 137  K 3.1*  CL 104  CO2 24  GLUCOSE 100*  BUN 9  CREATININE 0.75  CALCIUM 8.1*   GFR: Estimated Creatinine Clearance: 87 mL/min (by C-G formula based on SCr of 0.75 mg/dL). Liver Function Tests:  Recent Labs Lab 01/23/16 1300  AST 17  ALT 14  ALKPHOS 72  BILITOT 0.5  PROT 7.2  ALBUMIN 3.7   No results for input(s): LIPASE, AMYLASE in the last 168 hours. No results for input(s): AMMONIA in the last 168 hours. Coagulation Profile:  No results for input(s): INR, PROTIME in the last 168 hours. Cardiac Enzymes:  Recent Labs Lab 01/23/16 1300  TROPONINI <0.03   BNP (last 3 results) No results for input(s): PROBNP in the last 8760 hours. HbA1C: No results for input(s): HGBA1C in the last 72 hours. CBG: No results for input(s): GLUCAP in the last 168 hours. Lipid Profile: No results for input(s): CHOL, HDL, LDLCALC, TRIG, CHOLHDL, LDLDIRECT in the last 72 hours. Thyroid Function Tests: No results for input(s): TSH, T4TOTAL, FREET4, T3FREE, THYROIDAB in the last 72 hours. Anemia Panel: No results for input(s): VITAMINB12, FOLATE, FERRITIN, TIBC, IRON, RETICCTPCT in the last 72 hours. Urine analysis:    Component Value Date/Time   COLORURINE YELLOW 01/23/2016 [place] 01/23/2016 1355   LABSPEC 1.022 01/23/2016 1355   PHURINE 6.5 01/23/2016 [place] 01/23/2016 1355   HGBUR NEGATIVE 01/23/2016 [place] 01/23/2016 1355   [place] 01/23/2016 1355   PROTEINUR 30 (A) 01/23/2016 1355   NITRITE NEGATIVE 01/23/2016 1355   LEUKOCYTESUR NEGATIVE 01/23/2016 1355   Sepsis Labs: !!!!!!!!!!!!!!!!!!!!!!!!!!!!!!!!!!!!!!!!!!!! '@LABRCNTIP'$ (procalcitonin:4,lacticidven:4) )No results found for this or any previous visit (from the past 240 hour(s)).   Radiological Exams on Admission: Dg Chest 2 View  Result Date: 01/23/2016 CLINICAL DATA:  Productive cough, fever and headache. EXAM: CHEST  2 VIEW COMPARISON:  None. FINDINGS: Heart size is normal. Mediastinal shadows are normal. There is patchy bronchopneumonia throughout both lungs. No dense consolidation or lobar collapse. No effusion. No acute bone finding. IMPRESSION: Patchy bilateral bronchopneumonia. Electronically Signed   By: Nelson Chimes M.D.   On: 01/23/2016 13:53    EKG: Independently reviewed. Sinus tachycardia, inverted T-waves, ST depression in lateral leads  Assessment/Plan Active Problems:   Pneumonia  HCAP Sepsis Will treat as HCAP. Sepsis resolved by the time I evaluated the patient. -continue vancomycin/zosyn -urine strep/legionella -sputum culture/gram stain -blood cultures -Mucinex  Hypokalemia -kdur 1mq -recheck in AM  Depression -continue Wellbutrin  Anxiety -continue ativan  Chronic chest pain Secondary to lung mass. Troponin negative. EKG significant for diffusely inverted T-waves with nothing to compare. -repeat EKG -continue Percocet  Lung cancer with brain mets [place] -[place] Adderall  Hypertension -continue metoprolol    DVT prophylaxis: Lovenox Code Status: Full code Family Communication: Son at bedside Disposition Plan: Anticipate discharge home in 2-3 days Consults called: None Admission status: Inpatient, telemetry   RCordelia Poche MD [facility] Pager [phone] If 7PM-7AM, please contact  night-coverage [email or web address] Password [email or web address]  01/23/2016, 5:42 PM

## 2016-01-23 NOTE — Progress Notes (Signed)
Pt confirms with ED CM she is visiting her children in [place] for thanksgiving and will be returning today to [place] Pt with [employer] and states she goes to the cancer center at [facility] for oncology services in [place].   Pt did states she may later return to reside in [place]  CM spoke with pt about her medicaid of [place] covering only [place] emergency services for possible a brief time and with it she would be uninsured [employer] resident with no pcp.  CM discussed and provided written information to assist pt with determining choice for uninsured accepting pcps, discussed the importance of pcp vs EDP services for f/u care, www.needymeds.org, www.goodrx.com, discounted pharmacies and other [employer] such as [employer], affordable care act, financial assistance, uninsured dental services,  med assist, DSS and  health department  Reviewed resources for [employer] uninsured accepting pcps like [facility], family medicine at [employer], [facility], [facility], local urgent care centers, [facility], [facility] family practice, general medical clinics, family services of the [place], [facility] urgent care plus others, medication resources, [facility] out [facility]ies and housing Pt voiced understanding and appreciation of resources provided  Provided [facility] contact information

## 2016-01-24 DIAGNOSIS — C7931 Secondary malignant neoplasm of brain: Secondary | ICD-10-CM

## 2016-01-24 DIAGNOSIS — C3490 Malignant neoplasm of unspecified part of unspecified bronchus or lung: Secondary | ICD-10-CM

## 2016-01-24 DIAGNOSIS — J189 Pneumonia, unspecified organism: Secondary | ICD-10-CM

## 2016-01-24 DIAGNOSIS — G43019 Migraine without aura, intractable, without status migrainosus: Secondary | ICD-10-CM

## 2016-01-24 LAB — RESPIRATORY PANEL BY PCR

## 2016-01-24 LAB — BASIC METABOLIC PANEL
Anion gap: 8 (ref 5–15)
BUN: 10 mg/dL (ref 6–20)
CO2: 22 mmol/L (ref 22–32)
Calcium: 7.4 mg/dL — ABNORMAL LOW (ref 8.9–10.3)
Chloride: 108 mmol/L (ref 101–111)
Creatinine, Ser: 0.86 mg/dL (ref 0.44–1.00)
GFR calc Af Amer: >60 mL/min (ref >60)
GFR calc non Af Amer: >60 mL/min (ref >60)
Glucose, Bld: 92 mg/dL (ref 65–99)
Potassium: 3.5 mmol/L (ref 3.5–5.1)
Sodium: 138 mmol/L (ref 135–145)

## 2016-01-24 LAB — URINE CULTURE: Culture: <10000 — AB

## 2016-01-24 LAB — HIV ANTIBODY (ROUTINE TESTING W REFLEX): HIV Screen 4th Generation wRfx: NONREACTIVE

## 2016-01-24 LAB — STREP PNEUMONIAE URINARY ANTIGEN: Strep Pneumo Urinary Antigen: NEGATIVE

## 2016-01-24 LAB — PROCALCITONIN: Procalcitonin: 0.1 ng/mL

## 2016-01-24 MED ORDER — SUMATRIPTAN SUCCINATE 50 MG PO TABS
50.0000 mg | ORAL_TABLET | Freq: Once | ORAL | Status: DC
  Filled 2016-01-24: qty 1

## 2016-01-24 MED ORDER — SUMATRIPTAN SUCCINATE 6 MG/0.5ML ~~LOC~~ SOLN
6.0000 mg | Freq: Once | SUBCUTANEOUS | Status: AC
  Administered 2016-01-24: 6 mg via SUBCUTANEOUS
  Filled 2016-01-24: qty 0.5

## 2016-01-24 MED ORDER — SUMATRIPTAN SUCCINATE 6 MG/0.5ML ~~LOC~~ SOLN
6.0000 mg | SUBCUTANEOUS | Status: AC | PRN
  Administered 2016-01-24: 6 mg via SUBCUTANEOUS
  Filled 2016-01-24: qty 0.5

## 2016-01-24 MED ORDER — MORPHINE SULFATE (PF) 2 MG/ML IV SOLN
2.0000 mg | INTRAVENOUS | Status: DC | PRN
  Administered 2016-01-24 (×3): 2 mg via INTRAVENOUS
  Filled 2016-01-24 (×3): qty 1

## 2016-01-24 NOTE — Progress Notes (Addendum)
PROGRESS NOTE  Ashley Mayo ACZ:[id number] DOB: 1965/11/11 DOA: 01/23/2016 PCP: No primary care provider on file.  HPI/Recap of past 24 hours: 50 yo female w/past med hx pf metastatic lung cancer & HTN from [place] who presented to the ER with fevers, chills, cough with sputum & right ear pain w/?discharge.  Found to have pneumonia. Patient limited to the hospitalist service and started on antibiotics and IV fluids, treated as healthcare associated pneumonia. Respiratory virus panel positive for metapneumovirus.  By hospital day 2, patient feeling only a little bit better. Her main complaint is of severe headache, she says she suffers from migraines and this is similar to that. She also complains of continued cough with greenish sputum and some right ear pain.  Addendum: This evening, was able to evaluate patient's ear canals with an otoscope. Right ear canal noted trace amount of blood and large amount of cerumen. Left ear canal patients that she could not hear anything, however no pain noted a large amount of blood membrane if any poorly visualized. Suspect the patient has bilateral membrane rupture. We'll discuss with ENT.  Assessment/Plan: Principal Problem:   Healthcare-associated pneumonia: Patient on IV cefepime and vancomycin. Does not look to be a postobstructive pneumonia. White blood cell count stable. No further fevers. Pro calcitonin level normal. 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 She is not currently on any steroids. Stable for now.    Essential hypertension: Continue home medications  Migraine headache: When necessary Imitrex     Code Status: Full code   Family Communication: Spoke with brother on the phone   Disposition Plan: Home in the next few days once her coughing  improved and breathing stabilized.    Consultants:  None   Procedures:  None   Antimicrobials:   IV Zosyn and vancomycin 11/30-present  DVT prophylaxis: Lovenox   Objective: Vitals:   01/23/16 2130 01/24/16 0615 01/24/16 1349 01/24/16 1351  BP: 109/68 103/74 138/89   Pulse: (!) 101 88 76   Resp: '20 20 18   '$ Temp: 98.2 F (36.8 C) 98.7 F (37.1 C) 97.6 F (36.4 C)   TempSrc: Oral Oral Oral   SpO2: 96% 97% 100% 99%  Weight:      Height:        Intake/Output Summary (Last 24 hours) at 01/24/16 1551 Last data filed at 01/24/16 1200  Gross per 24 hour  Intake             1070 ml  Output                0 ml  Net             1070 ml   Filed Weights   01/23/16 1211 01/23/16 1901  Weight: 81.6 kg (180 lb) 81.2 kg (179 lb 1.6 oz)    Exam:   General:  Alert and oriented 3, mild distress secondary to headache   Cardiovascular: Regular rate and rhythm, S1 and S2   Respiratory: Mostly clear to auscultation, some decreased breath sounds right basilar   Abdomen: Soft, nontender, nondistended, positive bowel sounds   Musculoskeletal: No clubbing or cyanosis or edema   Skin: No skin breaks, tears or lesions  Psychiatry: Patient is appropriate, no evidence of psychoses    Data Reviewed: CBC:  Recent Labs Lab 01/23/16  1300  WBC 9.0  NEUTROABS 7.6  HGB 13.9  HCT 42.6  MCV 95.1  PLT 532   Basic Metabolic Panel:  Recent Labs Lab 01/23/16 1300 01/24/16 0455  NA 137 138  K 3.1* 3.5  CL 104 108  CO2 24 22  GLUCOSE 100* 92  BUN 9 10  CREATININE 0.75 0.86  CALCIUM 8.1* 7.4*   GFR: Estimated Creatinine Clearance: 79.8 mL/min (by C-G formula based on SCr of 0.86 mg/dL). Liver Function Tests:  Recent Labs Lab 01/23/16 1300  AST 17  ALT 14  ALKPHOS 72  BILITOT 0.5  PROT 7.2  ALBUMIN 3.7   No results for input(s): LIPASE, AMYLASE in the last 168 hours. No results for input(s): AMMONIA in the last 168 hours. Coagulation Profile: No results  for input(s): INR, PROTIME in the last 168 hours. Cardiac Enzymes:  Recent Labs Lab 01/23/16 1300  TROPONINI <0.03   BNP (last 3 results) No results for input(s): PROBNP in the last 8760 hours. HbA1C: No results for input(s): HGBA1C in the last 72 hours. CBG: No results for input(s): GLUCAP in the last 168 hours. Lipid Profile: No results for input(s): CHOL, HDL, LDLCALC, TRIG, CHOLHDL, LDLDIRECT in the last 72 hours. Thyroid Function Tests: No results for input(s): TSH, T4TOTAL, FREET4, T3FREE, THYROIDAB in the last 72 hours. Anemia Panel: No results for input(s): VITAMINB12, FOLATE, FERRITIN, TIBC, IRON, RETICCTPCT in the last 72 hours. Urine analysis:    Component Value Date/Time   COLORURINE YELLOW 01/23/2016 [place] 01/23/2016 1355   LABSPEC 1.022 01/23/2016 1355   PHURINE 6.5 01/23/2016 1355   GLUCOSEU NEGATIVE 01/23/2016 1355   HGBUR NEGATIVE 01/23/2016 [place] 01/23/2016 1355   KETONESUR NEGATIVE 01/23/2016 1355   PROTEINUR 30 (A) 01/23/2016 1355   NITRITE NEGATIVE 01/23/2016 1355   LEUKOCYTESUR NEGATIVE 01/23/2016 1355   Sepsis Labs: '@LABRCNTIP'$ (procalcitonin:4,lacticidven:4)  ) Recent Results (from the past 240 hour(s))  Blood Culture (routine x 2)     Status: None (Preliminary result)   Collection Time: 01/23/16 12:49 PM  Result Value Ref Range Status   Specimen Description BLOOD RIGHT ANTECUBITAL  Final   Special Requests BOTTLES DRAWN AEROBIC AND ANAEROBIC 5ML  Final   Culture   Final    NO GROWTH < 24 HOURS Performed at [facility]    Report Status PENDING  Incomplete  Blood Culture (routine x 2)     Status: None (Preliminary result)   Collection Time: 01/23/16  1:04 PM  Result Value Ref Range Status   Specimen Description BLOOD LEFT HAND  Final   Special Requests IN PEDIATRIC BOTTLE 3CC  Final   Culture   Final    NO GROWTH < 24 HOURS Performed at [facility]    Report Status PENDING   Incomplete  Urine culture     Status: Abnormal   Collection Time: 01/23/16  1:55 PM  Result Value Ref Range Status   Specimen Description URINE, CLEAN CATCH  Final   Special Requests NONE  Final   Culture (A)  Final    <10,000 COLONIES/mL INSIGNIFICANT GROWTH Performed at [facility]    Report Status 01/24/2016 FINAL  Final  Respiratory Panel by PCR     Status: Abnormal   Collection Time: 01/23/16  3:16 PM  Result Value Ref Range Status   Adenovirus NOT DETECTED NOT DETECTED Final   Coronavirus 229E NOT DETECTED NOT DETECTED Final   Coronavirus HKU1 NOT DETECTED NOT DETECTED  Final   Coronavirus NL63 NOT DETECTED NOT DETECTED Final   Coronavirus OC43 NOT DETECTED NOT DETECTED Final   Metapneumovirus DETECTED (A) NOT DETECTED Final   Rhinovirus / Enterovirus NOT DETECTED NOT DETECTED Final   Influenza A NOT DETECTED NOT DETECTED Final   Influenza B NOT DETECTED NOT DETECTED Final   Parainfluenza Virus 1 NOT DETECTED NOT DETECTED Final   Parainfluenza Virus 2 NOT DETECTED NOT DETECTED Final   Parainfluenza Virus 3 NOT DETECTED NOT DETECTED Final   Parainfluenza Virus 4 NOT DETECTED NOT DETECTED Final   Respiratory Syncytial Virus NOT DETECTED NOT DETECTED Final   Bordetella pertussis NOT DETECTED NOT DETECTED Final   Chlamydophila pneumoniae NOT DETECTED NOT DETECTED Final   Mycoplasma pneumoniae NOT DETECTED NOT DETECTED Final    Comment: Performed at [facility]  Culture, sputum-assessment     Status: None   Collection Time: 01/23/16  9:34 PM  Result Value Ref Range Status   Specimen Description EXPECTORATED SPUTUM  Final   Special Requests NONE  Final   Sputum evaluation   Final    THIS SPECIMEN IS ACCEPTABLE. RESPIRATORY CULTURE REPORT TO FOLLOW.   Report Status 01/23/2016 FINAL  Final  Culture, respiratory (NON-Expectorated)     Status: None (Preliminary result)   Collection Time: 01/23/16  9:34 PM  Result Value Ref Range Status   Specimen Description  SPUTUM  Final   Special Requests NONE  Final   Gram Stain   Final    ABUNDANT WBC PRESENT,BOTH PMN AND MONONUCLEAR RARE GRAM POSITIVE COCCI IN PAIRS RARE GRAM VARIABLE ROD Performed at [facility]    Culture PENDING  Incomplete   Report Status PENDING  Incomplete      Studies: No results found.  Scheduled Meds: . buPROPion  200 mg Oral BID  . enoxaparin (LOVENOX) injection  40 mg Subcutaneous Q24H  . metoprolol succinate  25 mg Oral Daily  . montelukast  10 mg Oral Daily  . pantoprazole  40 mg Oral Daily  . piperacillin-tazobactam (ZOSYN)  IV  3.375 g Intravenous Q8H  . vancomycin  1,250 mg Intravenous Q12H    Continuous Infusions:   LOS: 1 day    Annita Brod, MD [facility] Pager [phone]  If 7PM-7AM, please contact night-coverage [email or web address] Password [email or web address] 01/24/2016, 3:51 PM

## 2016-01-25 DIAGNOSIS — H7293 Unspecified perforation of tympanic membrane, bilateral: Secondary | ICD-10-CM

## 2016-01-25 LAB — LEGIONELLA PNEUMOPHILA SEROGP 1 UR AG: L. pneumophila Serogp 1 Ur Ag: NEGATIVE

## 2016-01-25 MED ORDER — ONDANSETRON HCL 4 MG/2ML IJ SOLN
4.0000 mg | Freq: Four times a day (QID) | INTRAMUSCULAR | Status: DC | PRN
  Administered 2016-01-25: 4 mg via INTRAVENOUS
  Filled 2016-01-25: qty 2

## 2016-01-25 MED ORDER — GUAIFENESIN-DM 100-10 MG/5ML PO SYRP
5.0000 mL | ORAL_SOLUTION | Freq: Four times a day (QID) | ORAL | Status: DC
  Administered 2016-01-25 – 2016-01-27 (×6): 5 mL via ORAL
  Filled 2016-01-25 (×7): qty 10

## 2016-01-25 MED ORDER — CIPROFLOXACIN-DEXAMETHASONE 0.3-0.1 % OT SUSP
4.0000 [drp] | Freq: Two times a day (BID) | OTIC | Status: DC
  Administered 2016-01-25 – 2016-01-27 (×5): 4 [drp] via OTIC
  Filled 2016-01-25: qty 7.5

## 2016-01-25 MED ORDER — SALINE SPRAY 0.65 % NA SOLN
1.0000 | NASAL | Status: DC | PRN
  Filled 2016-01-25: qty 44

## 2016-01-25 MED ORDER — GUAIFENESIN-CODEINE 100-10 MG/5ML PO SOLN: 5.0000 mL | Freq: Four times a day (QID) | ORAL | Status: DC

## 2016-01-25 MED ORDER — SUMATRIPTAN SUCCINATE 6 MG/0.5ML ~~LOC~~ SOLN
6.0000 mg | Freq: Once | SUBCUTANEOUS | Status: AC
  Administered 2016-01-25: 6 mg via SUBCUTANEOUS
  Filled 2016-01-25: qty 0.5

## 2016-01-25 NOTE — Progress Notes (Signed)
PROGRESS NOTE  Ashley Mayo RDE:[id number] DOB: Jul 14, 1965 DOA: 01/23/2016 PCP: Pcp Not In System  HPI/Recap of past 24 hours: 50 yo female w/past med hx pf metastatic lung cancer & HTN from [place] who presented to the ER with fevers, chills, cough with sputum & right ear pain w/?discharge.  Found to have pneumonia. Patient limited to the hospitalist service and started on antibiotics and IV fluids, treated as healthcare associated pneumonia. Respiratory virus panel positive for metapneumovirus.  By hospital day 2, patient feeling only a little bit better. Her main complaint is of severe headache, she says she suffers from migraines and this is similar to that. She also complains of continued cough with greenish sputum and some right ear pain. She says she cannot hear very much out of the left ear. Visualization with otoscope found blood in both ear canals, left greater than right.  This morning, patient had mild headache/migraine, but Imitrex helped. She still having significant cough, bilateral ear pain and overall feels very congested  Assessment/Plan: Principal Problem:   Healthcare-associated pneumonia: Patient on IV cefepime and vancomycin. Does not look to be a postobstructive pneumonia. White blood cell count stable. No further fevers. Pro calcitonin level normal. 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 She is not currently on any steroids. Stable for now.  Suspected perforated eardrum secondary to otitis media: Spoke with ENT. The antibiotics that she is on for her pneumonia should cover generalized infections but as per recommendations of also started Ciprodex. Upon discharge, patient can either follow-up with them or is okay to fly and  she can follow up with ENT in [place], which the patient prefers.    Essential hypertension: Continue home medications  Migraine headache: When necessary Imitrex   Code Status: Full code   Family Communication: Left message for son  Disposition Plan: Home in the next few days once her coughing improved and breathing stabilized.     Consultants:  Discussed with ENT   Procedures:  None   Antimicrobials:   IV Zosyn and vancomycin 11/30-present  DVT prophylaxis: Lovenox   Objective: Vitals:   01/24/16 1349 01/24/16 1351 01/24/16 2114 01/25/16 0553  BP: 138/89  109/78 124/78  Pulse: 76  83 74  Resp: '18  18 18  '$ Temp: 97.6 F (36.4 C)  98.4 F (36.9 C) 97.9 F (36.6 C)  TempSrc: Oral  Oral Oral  SpO2: 100% 99% 100% 97%  Weight:      Height:        Intake/Output Summary (Last 24 hours) at 01/25/16 1234 Last data filed at 01/25/16 0600  Gross per 24 hour  Intake              600 ml  Output                0 ml  Net              600 ml   Filed Weights   01/23/16 1211 01/23/16 1901  Weight: 81.6 kg (180 lb) 81.2 kg (179 lb 1.6 oz)    Exam:   General:  Alert and oriented 3, Mild distress secondary to coughing  Cardiovascular: Regular rate and rhythm, S1 and S2   Respiratory: Mostly clear to auscultation, some decreased breath  sounds right basilar , Upper airway noise  Abdomen: Soft, nontender, nondistended, positive bowel sounds   Musculoskeletal: No clubbing or cyanosis or edema   Skin: No skin breaks, tears or lesions  Psychiatry: Patient is appropriate, no evidence of psychoses    Data Reviewed: CBC:  Recent Labs Lab 01/23/16 1300  WBC 9.0  NEUTROABS 7.6  HGB 13.9  HCT 42.6  MCV 95.1  PLT 301   Basic Metabolic Panel:  Recent Labs Lab 01/23/16 1300 01/24/16 0455  NA 137 138  K 3.1* 3.5  CL 104 108  CO2 24 22  GLUCOSE 100* 92  BUN 9 10  CREATININE 0.75 0.86  CALCIUM 8.1* 7.4*   GFR: Estimated Creatinine Clearance: 79.8  mL/min (by C-G formula based on SCr of 0.86 mg/dL). Liver Function Tests:  Recent Labs Lab 01/23/16 1300  AST 17  ALT 14  ALKPHOS 72  BILITOT 0.5  PROT 7.2  ALBUMIN 3.7   No results for input(s): LIPASE, AMYLASE in the last 168 hours. No results for input(s): AMMONIA in the last 168 hours. Coagulation Profile: No results for input(s): INR, PROTIME in the last 168 hours. Cardiac Enzymes:  Recent Labs Lab 01/23/16 1300  TROPONINI <0.03   BNP (last 3 results) No results for input(s): PROBNP in the last 8760 hours. HbA1C: No results for input(s): HGBA1C in the last 72 hours. CBG: No results for input(s): GLUCAP in the last 168 hours. Lipid Profile: No results for input(s): CHOL, HDL, LDLCALC, TRIG, CHOLHDL, LDLDIRECT in the last 72 hours. Thyroid Function Tests: No results for input(s): TSH, T4TOTAL, FREET4, T3FREE, THYROIDAB in the last 72 hours. Anemia Panel: No results for input(s): VITAMINB12, FOLATE, FERRITIN, TIBC, IRON, RETICCTPCT in the last 72 hours. Urine analysis:    Component Value Date/Time   COLORURINE YELLOW 01/23/2016 [place] 01/23/2016 1355   LABSPEC 1.022 01/23/2016 1355   PHURINE 6.5 01/23/2016 1355   GLUCOSEU NEGATIVE 01/23/2016 1355   HGBUR NEGATIVE 01/23/2016 [place] 01/23/2016 1355   KETONESUR NEGATIVE 01/23/2016 1355   PROTEINUR 30 (A) 01/23/2016 1355   NITRITE NEGATIVE 01/23/2016 1355   LEUKOCYTESUR NEGATIVE 01/23/2016 1355   Sepsis Labs: '@LABRCNTIP'$ (procalcitonin:4,lacticidven:4)  ) Recent Results (from the past 240 hour(s))  Blood Culture (routine x 2)     Status: None (Preliminary result)   Collection Time: 01/23/16 12:49 PM  Result Value Ref Range Status   Specimen Description BLOOD RIGHT ANTECUBITAL  Final   Special Requests BOTTLES DRAWN AEROBIC AND ANAEROBIC 5ML  Final   Culture   Final    NO GROWTH < 24 HOURS Performed at [facility]    Report Status PENDING  Incomplete  Blood  Culture (routine x 2)     Status: None (Preliminary result)   Collection Time: 01/23/16  1:04 PM  Result Value Ref Range Status   Specimen Description BLOOD LEFT HAND  Final   Special Requests IN PEDIATRIC BOTTLE 3CC  Final   Culture   Final    NO GROWTH < 24 HOURS Performed at [facility]    Report Status PENDING  Incomplete  Urine culture     Status: Abnormal   Collection Time: 01/23/16  1:55 PM  Result Value Ref Range Status   Specimen Description URINE, CLEAN CATCH  Final   Special Requests NONE  Final   Culture (A)  Final    <10,000 COLONIES/mL INSIGNIFICANT GROWTH Performed at [facility]    Report Status 01/24/2016  FINAL  Final  Respiratory Panel by PCR     Status: Abnormal   Collection Time: 01/23/16  3:16 PM  Result Value Ref Range Status   Adenovirus NOT DETECTED NOT DETECTED Final   Coronavirus 229E NOT DETECTED NOT DETECTED Final   Coronavirus HKU1 NOT DETECTED NOT DETECTED Final   Coronavirus NL63 NOT DETECTED NOT DETECTED Final   Coronavirus OC43 NOT DETECTED NOT DETECTED Final   Metapneumovirus DETECTED (A) NOT DETECTED Final   Rhinovirus / Enterovirus NOT DETECTED NOT DETECTED Final   Influenza A NOT DETECTED NOT DETECTED Final   Influenza B NOT DETECTED NOT DETECTED Final   Parainfluenza Virus 1 NOT DETECTED NOT DETECTED Final   Parainfluenza Virus 2 NOT DETECTED NOT DETECTED Final   Parainfluenza Virus 3 NOT DETECTED NOT DETECTED Final   Parainfluenza Virus 4 NOT DETECTED NOT DETECTED Final   Respiratory Syncytial Virus NOT DETECTED NOT DETECTED Final   Bordetella pertussis NOT DETECTED NOT DETECTED Final   Chlamydophila pneumoniae NOT DETECTED NOT DETECTED Final   Mycoplasma pneumoniae NOT DETECTED NOT DETECTED Final    Comment: Performed at [facility]  Culture, sputum-assessment     Status: None   Collection Time: 01/23/16  9:34 PM  Result Value Ref Range Status   Specimen Description EXPECTORATED SPUTUM  Final   Special  Requests NONE  Final   Sputum evaluation   Final    THIS SPECIMEN IS ACCEPTABLE. RESPIRATORY CULTURE REPORT TO FOLLOW.   Report Status 01/23/2016 FINAL  Final  Culture, respiratory (NON-Expectorated)     Status: None (Preliminary result)   Collection Time: 01/23/16  9:34 PM  Result Value Ref Range Status   Specimen Description SPUTUM  Final   Special Requests NONE  Final   Gram Stain   Final    ABUNDANT WBC PRESENT,BOTH PMN AND MONONUCLEAR RARE GRAM POSITIVE COCCI IN PAIRS RARE GRAM VARIABLE ROD    Culture   Final    CULTURE REINCUBATED FOR BETTER GROWTH Performed at [facility]    Report Status PENDING  Incomplete      Studies: No results found.  Scheduled Meds: . buPROPion  200 mg Oral BID  . ciprofloxacin-dexamethasone  4 drop Both Ears BID  . enoxaparin (LOVENOX) injection  40 mg Subcutaneous Q24H  . metoprolol succinate  25 mg Oral Daily  . montelukast  10 mg Oral Daily  . pantoprazole  40 mg Oral Daily  . piperacillin-tazobactam (ZOSYN)  IV  3.375 g Intravenous Q8H  . vancomycin  1,250 mg Intravenous Q12H    Continuous Infusions:   LOS: 2 days    Annita Brod, MD [facility] Pager [phone]  If 7PM-7AM, please contact night-coverage [email or web address] Password [facility] 01/25/2016, 12:34 PM

## 2016-01-26 DIAGNOSIS — I10 Essential (primary) hypertension: Secondary | ICD-10-CM

## 2016-01-26 LAB — CULTURE, RESPIRATORY W GRAM STAIN: Culture: NORMAL

## 2016-01-26 LAB — PROCALCITONIN: Procalcitonin: <0.1 ng/mL

## 2016-01-26 LAB — CULTURE, RESPIRATORY

## 2016-01-26 MED ORDER — ALBUTEROL SULFATE (2.5 MG/3ML) 0.083% IN NEBU
2.5000 mg | INHALATION_SOLUTION | Freq: Four times a day (QID) | RESPIRATORY_TRACT | Status: DC
  Administered 2016-01-26 – 2016-01-27 (×5): 2.5 mg via RESPIRATORY_TRACT
  Filled 2016-01-26 (×5): qty 3

## 2016-01-26 NOTE — Progress Notes (Signed)
Pharmacy Antibiotic Note  Ashley Mayo is a 50 y.o. female admitted on 01/23/2016 with  Pharmacy has been consulted for dosing of vancomycin and Zosyn for r/o pulmonary infection.   Patient received vancomycin 1 gram and Zosyn 3.375 grams in ED.  Vancomycin stopped 12/2.  Today, 01/26/2016: Day #3 antibiotics  Virus panel detects metapneumovirus  PCT < 0.10  Renal: SCr WNL  WBC WNL  Plan:  Zosyn 3.375gm IV q8h over 4h infusion remains appropriate dose for renal function  Based on virus panel result and PCT < 0.1, consider stopping antibiotics as this appears to viral infection   Height: '5\' 4"'$  (162.6 cm) Weight: 179 lb 1.6 oz (81.2 kg) IBW/kg (Calculated) : 54.7  Temp (24hrs), Avg:98.2 F (36.8 C), Min:97.6 F (36.4 C), Max:98.5 F (36.9 C)   Recent Labs Lab 01/23/16 1300 01/23/16 1308 01/24/16 0455  WBC 9.0  --   --   CREATININE 0.75  --  0.86  LATICACIDVEN  --  0.87  --     Estimated Creatinine Clearance: 79.8 mL/min (by C-G formula based on SCr of 0.86 mg/dL).    Allergies  Allergen Reactions  . Codeine Hives  . Shellfish Allergy Hives   Antimicrobials this admission:  11/30 Zosyn >> 11/30 Vancomycin >> 12/2 11/30 Tamiflu x 1  Dose adjustments this admission:  11/30: Vancomycin: rather than nomogram dosage of 1g q8h, opted for '1250mg'$  q12h in setting of concomitant Zosyn therapy with increased risk of nephrotoxicity  Microbiology results:  11/30 BCx x2: NGTD 11/30 UCx: insignificant growth 11/30 Resp panel by PCR: +metapneumovirus 11/30: Sputum cx: nl flora 11/30 Strep pneumo [id number] 11/30 Legionella Ua: negative  Thank you for allowing pharmacy to be a part of this patient's care.  Doreene Eland, PharmD, BCPS.   Pager: [phone] 01/26/2016 11:11 AM

## 2016-01-26 NOTE — Progress Notes (Signed)
PROGRESS NOTE  Ashley Mayo FYB:[id number] DOB: 1965/04/12 DOA: 01/23/2016 PCP: Pcp Not In System  HPI/Recap of past 24 hours: 50 yo female w/past med hx pf metastatic lung cancer & HTN from [place] who presented to the ER with fevers, chills, cough with sputum & right ear pain w/?discharge.  Found to have pneumonia. Patient limited to the hospitalist service and started on antibiotics and IV fluids, treated as healthcare associated pneumonia. Respiratory virus panel positive for metapneumovirus.  By hospital day 2, patient feeling only a little bit better. Her main complaint is of severe headache, she says she suffers from migraines and this is similar to that. She also complains of continued cough with greenish sputum and some right ear pain. She says she cannot hear very much out of the left ear. Visualization with otoscope found blood in both ear canals, left greater than right.  Patient continues to improve. Still with persistent coughing, right ear pain and congestion.  Assessment/Plan: Principal Problem:   Healthcare-associated pneumonia: Patient on IV cefepime and vancomycin. Does not look to be a postobstructive pneumonia. White blood cell count stable. No further fevers. Pro calcitonin level normal. cough medicine and nasal spray for symptom control. Will add scheduled albuterol  Active Problems:   Hypokalemia: Replace   Anxiety   Depression: Continue home meds   Metastatic lung cancer (metastasis from lung to other site) [facility]) with secondary brain metastases: Patient has had previous gamma knife. She is not currently on any steroids. Stable for now.  Suspected perforated eardrum secondary to otitis media: Spoke with ENT. The antibiotics that she is on for her pneumonia should cover generalized infections but as per recommendations of also started Ciprodex. Upon discharge, patient can either follow-up with them or is okay to fly and she can follow up with ENT in [place],  which the patient prefers.    Essential hypertension: Continue home medications  Migraine headache: When necessary Imitrex   Code Status: Full code   Family Communication: Left message for son   Disposition Plan:Anticipate discharge tomorrow    Antibiotics  Zosyn and vancomycin 11/30-present  DVT prophylaxis: Lovenox   Objective: Vitals:   01/25/16 0553 01/25/16 1508 01/25/16 2116 01/26/16 0447  BP: 124/78 123/84 135/81 119/70  Pulse: 74 70 81 80  Resp: '18 20 16 20  '$ Temp: 97.9 F (36.6 C) 97.6 F (36.4 C) 98.5 F (36.9 C) 98.4 F (36.9 C)  TempSrc: Oral Oral Oral Oral  SpO2: 97% 98% 99% 98%  Weight:      Height:        Intake/Output Summary (Last 24 hours) at 01/26/16 1110 Last data filed at 01/26/16 0600  Gross per 24 hour  Intake              560 ml  Output                0 ml  Net              560 ml   Filed Weights   01/23/16 1211 01/23/16 1901  Weight: 81.6 kg (180 lb) 81.2 kg (179 lb 1.6 oz)    Exam:   General:  Alert and oriented 3,   Cardiovascular: Regular rate and rhythm, S1 and S2   Respiratory: Mostly clear to auscultation, Left upper lobe wheezing   Abdomen: Soft, nontender, nondistended, positive bowel sounds   Musculoskeletal: No clubbing or cyanosis or edema   Skin: No skin breaks, tears or lesions  Psychiatry: Patient is  appropriate, no evidence of psychoses    Data Reviewed: CBC:  Recent Labs Lab 01/23/16 1300  WBC 9.0  NEUTROABS 7.6  HGB 13.9  HCT 42.6  MCV 95.1  PLT 759   Basic Metabolic Panel:  Recent Labs Lab 01/23/16 1300 01/24/16 0455  NA 137 138  K 3.1* 3.5  CL 104 108  CO2 24 22  GLUCOSE 100* 92  BUN 9 10  CREATININE 0.75 0.86  CALCIUM 8.1* 7.4*   GFR: Estimated Creatinine Clearance: 79.8 mL/min (by C-G formula based on SCr of 0.86 mg/dL). Liver Function Tests:  Recent Labs Lab 01/23/16 1300  AST 17  ALT 14  ALKPHOS 72  BILITOT 0.5  PROT 7.2  ALBUMIN 3.7   No results for  input(s): LIPASE, AMYLASE in the last 168 hours. No results for input(s): AMMONIA in the last 168 hours. Coagulation Profile: No results for input(s): INR, PROTIME in the last 168 hours. Cardiac Enzymes:  Recent Labs Lab 01/23/16 1300  TROPONINI <0.03   BNP (last 3 results) No results for input(s): PROBNP in the last 8760 hours. HbA1C: No results for input(s): HGBA1C in the last 72 hours. CBG: No results for input(s): GLUCAP in the last 168 hours. Lipid Profile: No results for input(s): CHOL, HDL, LDLCALC, TRIG, CHOLHDL, LDLDIRECT in the last 72 hours. Thyroid Function Tests: No results for input(s): TSH, T4TOTAL, FREET4, T3FREE, THYROIDAB in the last 72 hours. Anemia Panel: No results for input(s): VITAMINB12, FOLATE, FERRITIN, TIBC, IRON, RETICCTPCT in the last 72 hours. Urine analysis:    Component Value Date/Time   COLORURINE YELLOW 01/23/2016 [place] 01/23/2016 1355   LABSPEC 1.022 01/23/2016 1355   PHURINE 6.5 01/23/2016 1355   GLUCOSEU NEGATIVE 01/23/2016 1355   HGBUR NEGATIVE 01/23/2016 [place] 01/23/2016 1355   KETONESUR NEGATIVE 01/23/2016 1355   PROTEINUR 30 (A) 01/23/2016 1355   NITRITE NEGATIVE 01/23/2016 1355   LEUKOCYTESUR NEGATIVE 01/23/2016 1355   Sepsis Labs: '@LABRCNTIP'$ (procalcitonin:4,lacticidven:4)  ) Recent Results (from the past 240 hour(s))  Blood Culture (routine x 2)     Status: None (Preliminary result)   Collection Time: 01/23/16 12:49 PM  Result Value Ref Range Status   Specimen Description BLOOD RIGHT ANTECUBITAL  Final   Special Requests BOTTLES DRAWN AEROBIC AND ANAEROBIC 5ML  Final   Culture   Final    NO GROWTH 2 DAYS Performed at [facility]    Report Status PENDING  Incomplete  Blood Culture (routine x 2)     Status: None (Preliminary result)   Collection Time: 01/23/16  1:04 PM  Result Value Ref Range Status   Specimen Description BLOOD LEFT HAND  Final   Special Requests IN  PEDIATRIC BOTTLE 3CC  Final   Culture   Final    NO GROWTH 2 DAYS Performed at [facility]    Report Status PENDING  Incomplete  Urine culture     Status: Abnormal   Collection Time: 01/23/16  1:55 PM  Result Value Ref Range Status   Specimen Description URINE, CLEAN CATCH  Final   Special Requests NONE  Final   Culture (A)  Final    <10,000 COLONIES/mL INSIGNIFICANT GROWTH Performed at [facility]    Report Status 01/24/2016 FINAL  Final  Respiratory Panel by PCR     Status: Abnormal   Collection Time: 01/23/16  3:16 PM  Result Value Ref Range Status   Adenovirus NOT DETECTED NOT DETECTED Final   Coronavirus  229E NOT DETECTED NOT DETECTED Final   Coronavirus HKU1 NOT DETECTED NOT DETECTED Final   Coronavirus NL63 NOT DETECTED NOT DETECTED Final   Coronavirus OC43 NOT DETECTED NOT DETECTED Final   Metapneumovirus DETECTED (A) NOT DETECTED Final   Rhinovirus / Enterovirus NOT DETECTED NOT DETECTED Final   Influenza A NOT DETECTED NOT DETECTED Final   Influenza B NOT DETECTED NOT DETECTED Final   Parainfluenza Virus 1 NOT DETECTED NOT DETECTED Final   Parainfluenza Virus 2 NOT DETECTED NOT DETECTED Final   Parainfluenza Virus 3 NOT DETECTED NOT DETECTED Final   Parainfluenza Virus 4 NOT DETECTED NOT DETECTED Final   Respiratory Syncytial Virus NOT DETECTED NOT DETECTED Final   Bordetella pertussis NOT DETECTED NOT DETECTED Final   Chlamydophila pneumoniae NOT DETECTED NOT DETECTED Final   Mycoplasma pneumoniae NOT DETECTED NOT DETECTED Final    Comment: Performed at [facility]  Culture, sputum-assessment     Status: None   Collection Time: 01/23/16  9:34 PM  Result Value Ref Range Status   Specimen Description EXPECTORATED SPUTUM  Final   Special Requests NONE  Final   Sputum evaluation   Final    THIS SPECIMEN IS ACCEPTABLE. RESPIRATORY CULTURE REPORT TO FOLLOW.   Report Status 01/23/2016 FINAL  Final  Culture, respiratory (NON-Expectorated)      Status: None   Collection Time: 01/23/16  9:34 PM  Result Value Ref Range Status   Specimen Description SPUTUM  Final   Special Requests NONE  Final   Gram Stain   Final    ABUNDANT WBC PRESENT,BOTH PMN AND MONONUCLEAR RARE GRAM POSITIVE COCCI IN PAIRS RARE GRAM VARIABLE ROD    Culture   Final    Consistent with normal respiratory flora. Performed at [facility]    Report Status 01/26/2016 FINAL  Final      Studies: No results found.  Scheduled Meds: . albuterol  2.5 mg Nebulization QID  . buPROPion  200 mg Oral BID  . ciprofloxacin-dexamethasone  4 drop Both Ears BID  . enoxaparin (LOVENOX) injection  40 mg Subcutaneous Q24H  . guaiFENesin-dextromethorphan  5 mL Oral Q6H WA  . metoprolol succinate  25 mg Oral Daily  . montelukast  10 mg Oral Daily  . pantoprazole  40 mg Oral Daily  . piperacillin-tazobactam (ZOSYN)  IV  3.375 g Intravenous Q8H    Continuous Infusions:   LOS: 3 days    Annita Brod, MD [facility] Pager [phone]  If 7PM-7AM, please contact night-coverage [email or web address] Password [email or web address] 01/26/2016, 11:10 AM

## 2016-01-26 NOTE — Progress Notes (Signed)
SATURATION QUALIFICATIONS: (This note is used to comply with regulatory documentation for home oxygen)  Patient Saturations on Room Air at Rest = 98%  Patient Saturations on Room Air while Ambulating = 96%

## 2016-01-27 MED ORDER — LEVOFLOXACIN 500 MG PO TABS: 500.0000 mg | ORAL_TABLET | Freq: Every day | ORAL | 0 refills | Status: AC

## 2016-01-27 MED ORDER — LEVOFLOXACIN 500 MG PO TABS: 500.0000 mg | ORAL_TABLET | Freq: Every day | ORAL | 0 refills | Status: DC

## 2016-01-27 MED ORDER — CIPROFLOXACIN-DEXAMETHASONE 0.3-0.1 % OT SUSP: 4.0000 [drp] | Freq: Two times a day (BID) | OTIC | 0 refills | Status: DC

## 2016-01-27 MED ORDER — OXYCODONE-ACETAMINOPHEN 5-325 MG PO TABS: 2.0000 | ORAL_TABLET | ORAL | 0 refills | Status: DC | PRN

## 2016-01-27 MED FILL — levoFLOXacin 500 MG TABS: 500 | 4 days supply | Qty: 4 | Fill #0

## 2016-01-27 MED FILL — OXYCODONE W/APAP 5/325 TAB: 5-325 | 3 days supply | Qty: 30 | Fill #0

## 2016-01-27 NOTE — Care Management Note (Signed)
Case Management Note  Patient Details  Name: Cendy Oconnor MRN: [id number] Date of Birth: 1965/07/06  Subjective/Objective:50 y/o f admitted w/ pneumo virus. [place]. [place] medicaid out of state. Informed of currently no insurance-TC [facility]acy for cost of levaquin, & percocet $9 for each-patient can afford. No further CM needs.                    Action/Plan:d/c home.   Expected Discharge Date:   (unknown)               Expected Discharge Plan:  Home/Self Care  In-House Referral:     Discharge planning Services  CM Consult, Medication Assistance  Post Acute Care Choice:    Choice offered to:     DME Arranged:    DME Agency:     HH Arranged:    HH Agency:     Status of Service:     If discussed at [employer], dates discussed:    Additional Comments:  Dessa Phi, RN 01/27/2016, 12:06 PM

## 2016-01-27 NOTE — Discharge Summary (Signed)
Discharge Summary  Ashley Mayo KKX:[id number] DOB: [birth year]  PCP: Pcp Not In System/Scott Gettinger, [place] Office: [phone] Fax: [phone]  Admit date: 01/23/2016 Discharge date: 01/27/2016  Time spent: 25 minutes   Recommendations for Outpatient Follow-up:  1. New medications: Levaquin 500 by mouth daily 5 days 2. New medication: Ciprodex 4 drops into both ears twice a day 3. Patient will follow-up with an ear nose and throat doctor after she returns back to [place]  Discharge Diagnoses:  Active Hospital Problems   Diagnosis Date Noted  . Healthcare-associated pneumonia 01/23/2016  . Perforated ear drum, bilateral 01/25/2016  . Hypokalemia 01/23/2016  . Anxiety 01/23/2016  . Depression 01/23/2016  . Metastatic lung cancer (metastasis from lung to other site) ([place]) 01/23/2016  . Brain metastasis ([place]) 01/23/2016  . Essential hypertension 01/23/2016    Resolved Hospital Problems   Diagnosis Date Noted Date Resolved  No resolved problems to display.    Discharge Condition: Improved, being discharged back to the care of her family, patient will return home back to [place] recommendation: Low-sodium   Vitals:   01/27/16 0650 01/27/16 0840  BP: 130/90   Pulse: 75 82  Resp: 20   Temp: 97.9 F (36.6 C)     History of present illness:  50 yo female w/past med hx pf metastatic lung cancer & HTN from [place] who presented to the ER with fevers, chills, cough with sputum & right ear pain w/?discharge.  Found to have pneumonia. Patient limited to the hospitalist service and started on antibiotics and IV fluids, treated as healthcare associated pneumonia.   Hospital Course:  Principal Problem:   Healthcare-associated pneumonia: Patient initially on IV cefepime and vancomycin. Chest x-ray did not look to be a postobstructive pneumonia. White count remained stable. No further fevers. Pro calcitonin level checked and found to be normal.  Patient treated symptomatically.Respiratory virus panel positive for metapneumovirus. This is more likely a viral etiology although continue on antibiotics given perforated eardrums. See below Active Problems:   Hypokalemia: Replaced, secondary to dehydration   Anxiety: Continue on home medications   Depression: Continued on home medications   Metastatic lung cancer (metastasis from lung to other site) [facility]) with secondary brain metastases.: Patient has had previous gamma knife. Currently not on any steroids.    Essential hypertension: Continued on home medications    Suspected Perforated ear drum, bilateral secondary to possible otitis media: Patient complained of right ear pain she also felt that she could not hear very much out of left ear. Visualization with otoscope found blood in both ear canals left greater than right as well as much impacted cerumen. Very poor viewing of right tympanic membrane could not see left. Spoke with ENT who felt and a box that she was on for the pneumonia would also cover generalized infections but they also recommended adding Ciprodex eardrops. They also recommended that upon discharge she can eat a follow-up with them or follow-up with an ENT doctor in [place], which the patient preferred. He said that she was safe to fly although she would have some ear pain.  Migraine headache: During hospitalization, patient had several migraines, similar to the ones that she's had at home. She was given doses of when necessary Imitrex which she responded to.   Procedures:  None   Consultations:   Case discussed with ear nose and throat  Discharge Exam: BP 130/90 (BP Location: Right Arm)   Pulse 82   Temp 97.9 F (36.6 C) (  Oral)   Resp 20   Ht '5\' 4"'$  (1.626 m)   Wt 81.2 kg (179 lb 1.6 oz)   SpO2 96%   BMI 30.74 kg/m   General: Alert and oriented 3, no acute distress  Cardiovascular: Regular rate and rhythm, S1-S2  Respiratory: Clear to auscultation  bilaterally   Discharge Instructions You were cared for by a hospitalist during your hospital stay. If you have any questions about your discharge medications or the care you received while you were in the hospital after you are discharged, you can call the unit and asked to speak with the hospitalist on call if the hospitalist that took care of you is not available. Once you are discharged, your primary care physician will handle any further medical issues. Please note that NO REFILLS for any discharge medications will be authorized once you are discharged, as it is imperative that you return to your primary care physician (or establish a relationship with a primary care physician if you do not have one) for your aftercare needs so that they can reassess your need for medications and monitor your lab values.  Discharge Instructions    Diet - low sodium heart healthy    Complete by:  As directed    Increase activity slowly    Complete by:  As directed        Medication List    TAKE these medications   acetaminophen 500 MG tablet Commonly known as:  TYLENOL Take 1,000 mg by mouth every 6 (six) hours as needed for mild pain, moderate pain, fever or headache.   ADDERALL XR 30 MG 24 hr capsule Generic drug:  amphetamine-dextroamphetamine Take 30 mg by mouth daily.   ADVAIR DISKUS 500-50 MCG/DOSE Aepb Generic drug:  Fluticasone-Salmeterol Take 1 puff by mouth 2 (two) times daily as needed (for shortness of breath).   albuterol (2.5 MG/3ML) 0.083% nebulizer solution Commonly known as:  PROVENTIL Take 2.5 mg by nebulization every 3 (three) hours as needed for wheezing or shortness of breath.   PROAIR HFA 108 (90 Base) MCG/ACT inhaler Generic drug:  albuterol Take 2 puffs by mouth 2 (two) times daily as needed for wheezing or shortness of breath.   buPROPion 200 MG 12 hr tablet Commonly known as:  WELLBUTRIN SR Take 200 mg by mouth 2 (two) times daily.   ciprofloxacin-dexamethasone  otic suspension Commonly known as:  CIPRODEX Place 4 drops into both ears 2 (two) times daily.   dextromethorphan-guaiFENesin 30-600 MG 12hr tablet Commonly known as:  MUCINEX DM Take 1 tablet by mouth 2 (two) times daily as needed for cough.   KEYTRUDA 100 MG/4ML Soln Generic drug:  pembrolizumab Inject 2 mg/kg into the vein every 21 ( twenty-one) days.   levofloxacin 500 MG tablet Commonly known as:  LEVAQUIN Take 1 tablet (500 mg total) by mouth daily.   lidocaine 5 % Commonly known as:  LIDODERM Place 1-2 patches onto the skin daily.   LORazepam 0.5 MG tablet Commonly known as:  ATIVAN Take 0.5 mg by mouth every 6 (six) hours as needed for anxiety.   metoprolol succinate 25 MG 24 hr tablet Commonly known as:  TOPROL-XL Take 25 mg by mouth daily.   montelukast 10 MG tablet Commonly known as:  SINGULAIR Take 10 mg by mouth daily.   nystatin 100000 UNIT/ML suspension Commonly known as:  MYCOSTATIN Take 5 mLs by mouth 4 (four) times daily as needed (for thrush).   oxyCODONE-acetaminophen 5-325 MG tablet Commonly known as:  PERCOCET/ROXICET Take 2 tablets by mouth every 4 (four) hours as needed. What changed:  reasons to take this   pantoprazole 40 MG tablet Commonly known as:  PROTONIX Take 40 mg by mouth daily.   valACYclovir 500 MG tablet Commonly known as:  VALTREX Take 500 mg by mouth 2 (two) times daily as needed (for cold sores).      Allergies  Allergen Reactions  . Codeine Hives  . Shellfish Allergy Hives      The results of significant diagnostics from this hospitalization (including imaging, microbiology, ancillary and laboratory) are listed below for reference.    Significant Diagnostic Studies: Dg Chest 2 View  Result Date: 01/23/2016 CLINICAL DATA:  Productive cough, fever and headache. EXAM: CHEST  2 VIEW COMPARISON:  None. FINDINGS: Heart size is normal. Mediastinal shadows are normal. There is patchy bronchopneumonia throughout both  lungs. No dense consolidation or lobar collapse. No effusion. No acute bone finding. IMPRESSION: Patchy bilateral bronchopneumonia. Electronically Signed   By: Nelson Chimes M.D.   On: 01/23/2016 13:53    Microbiology: Recent Results (from the past 240 hour(s))  Blood Culture (routine x 2)     Status: None (Preliminary result)   Collection Time: 01/23/16 12:49 PM  Result Value Ref Range Status   Specimen Description BLOOD RIGHT ANTECUBITAL  Final   Special Requests BOTTLES DRAWN AEROBIC AND ANAEROBIC 5ML  Final   Culture   Final    NO GROWTH 3 DAYS Performed at [facility]    Report Status PENDING  Incomplete  Blood Culture (routine x 2)     Status: None (Preliminary result)   Collection Time: 01/23/16  1:04 PM  Result Value Ref Range Status   Specimen Description BLOOD LEFT HAND  Final   Special Requests IN PEDIATRIC BOTTLE 3CC  Final   Culture   Final    NO GROWTH 3 DAYS Performed at [facility]    Report Status PENDING  Incomplete  Urine culture     Status: Abnormal   Collection Time: 01/23/16  1:55 PM  Result Value Ref Range Status   Specimen Description URINE, CLEAN CATCH  Final   Special Requests NONE  Final   Culture (A)  Final    <10,000 COLONIES/mL INSIGNIFICANT GROWTH Performed at [facility]    Report Status 01/24/2016 FINAL  Final  Respiratory Panel by PCR     Status: Abnormal   Collection Time: 01/23/16  3:16 PM  Result Value Ref Range Status   Adenovirus NOT DETECTED NOT DETECTED Final   Coronavirus 229E NOT DETECTED NOT DETECTED Final   Coronavirus HKU1 NOT DETECTED NOT DETECTED Final   Coronavirus NL63 NOT DETECTED NOT DETECTED Final   Coronavirus OC43 NOT DETECTED NOT DETECTED Final   Metapneumovirus DETECTED (A) NOT DETECTED Final   Rhinovirus / Enterovirus NOT DETECTED NOT DETECTED Final   Influenza A NOT DETECTED NOT DETECTED Final   Influenza B NOT DETECTED NOT DETECTED Final   Parainfluenza Virus 1 NOT DETECTED NOT DETECTED  Final   Parainfluenza Virus 2 NOT DETECTED NOT DETECTED Final   Parainfluenza Virus 3 NOT DETECTED NOT DETECTED Final   Parainfluenza Virus 4 NOT DETECTED NOT DETECTED Final   Respiratory Syncytial Virus NOT DETECTED NOT DETECTED Final   Bordetella pertussis NOT DETECTED NOT DETECTED Final   Chlamydophila pneumoniae NOT DETECTED NOT DETECTED Final   Mycoplasma pneumoniae NOT DETECTED NOT DETECTED Final    Comment: Performed at [place]  Status: None   Collection Time: 01/23/16  9:34 PM  Result Value Ref Range Status   Specimen Description EXPECTORATED SPUTUM  Final   Special Requests NONE  Final   Sputum evaluation   Final    THIS SPECIMEN IS ACCEPTABLE. RESPIRATORY CULTURE REPORT TO FOLLOW.   Report Status 01/23/2016 FINAL  Final  Culture, respiratory (NON-Expectorated)     Status: None   Collection Time: 01/23/16  9:34 PM  Result Value Ref Range Status   Specimen Description SPUTUM  Final   Special Requests NONE  Final   Gram Stain   Final    ABUNDANT WBC PRESENT,BOTH PMN AND MONONUCLEAR RARE GRAM POSITIVE COCCI IN PAIRS RARE GRAM VARIABLE ROD    Culture   Final    Consistent with normal respiratory flora. Performed at [facility]    Report Status 01/26/2016 FINAL  Final     Labs: Basic Metabolic Panel:  Recent Labs Lab 01/23/16 1300 01/24/16 0455  NA 137 138  K 3.1* 3.5  CL 104 108  CO2 24 22  GLUCOSE 100* 92  BUN 9 10  CREATININE 0.75 0.86  CALCIUM 8.1* 7.4*   Liver Function Tests:  Recent Labs Lab 01/23/16 1300  AST 17  ALT 14  ALKPHOS 72  BILITOT 0.5  PROT 7.2  ALBUMIN 3.7   No results for input(s): LIPASE, AMYLASE in the last 168 hours. No results for input(s): AMMONIA in the last 168 hours. CBC:  Recent Labs Lab 01/23/16 1300  WBC 9.0  NEUTROABS 7.6  HGB 13.9  HCT 42.6  MCV 95.1  PLT 295   Cardiac Enzymes:  Recent Labs Lab 01/23/16 1300  TROPONINI <0.03   BNP: BNP (last 3  results) No results for input(s): BNP in the last 8760 hours.  ProBNP (last 3 results) No results for input(s): PROBNP in the last 8760 hours.  CBG: No results for input(s): GLUCAP in the last 168 hours.     Signed:  Annita Brod, MD [facility] 01/27/2016, 11:52 AM

## 2016-01-28 LAB — CULTURE, BLOOD (ROUTINE X 2)
Culture: NO GROWTH
Culture: NO GROWTH

## 2016-02-02 ENCOUNTER — Encounter (HOSPITAL_COMMUNITY): Payer: Self-pay | Admitting: *Deleted

## 2016-02-02 ENCOUNTER — Emergency Department (HOSPITAL_COMMUNITY): Payer: Medicaid Other

## 2016-02-02 ENCOUNTER — Emergency Department (HOSPITAL_COMMUNITY)
Admission: EM | Admit: 2016-02-02 | Discharge: 2016-02-02 | Disposition: A | Discharge status: Home / Self Care | Payer: Medicaid Other | Attending: Emergency Medicine | Admitting: Emergency Medicine

## 2016-02-02 DIAGNOSIS — I10 Essential (primary) hypertension: Secondary | ICD-10-CM | POA: Diagnosis not present

## 2016-02-02 DIAGNOSIS — Y9301 Activity, walking, marching and hiking: Secondary | ICD-10-CM | POA: Diagnosis not present

## 2016-02-02 DIAGNOSIS — Y929 Unspecified place or not applicable: Secondary | ICD-10-CM | POA: Diagnosis not present

## 2016-02-02 DIAGNOSIS — W1840XA Slipping, tripping and stumbling without falling, unspecified, initial encounter: Secondary | ICD-10-CM | POA: Insufficient documentation

## 2016-02-02 DIAGNOSIS — Z85118 Personal history of other malignant neoplasm of bronchus and lung: Secondary | ICD-10-CM | POA: Diagnosis not present

## 2016-02-02 DIAGNOSIS — Z87891 Personal history of nicotine dependence: Secondary | ICD-10-CM | POA: Diagnosis not present

## 2016-02-02 DIAGNOSIS — Z85841 Personal history of malignant neoplasm of brain: Secondary | ICD-10-CM | POA: Insufficient documentation

## 2016-02-02 DIAGNOSIS — Z79899 Other long term (current) drug therapy: Secondary | ICD-10-CM | POA: Insufficient documentation

## 2016-02-02 DIAGNOSIS — S82892A Other fracture of left lower leg, initial encounter for closed fracture: Secondary | ICD-10-CM | POA: Diagnosis present

## 2016-02-02 DIAGNOSIS — Z760 Encounter for issue of repeat prescription: Secondary | ICD-10-CM

## 2016-02-02 DIAGNOSIS — J449 Chronic obstructive pulmonary disease, unspecified: Secondary | ICD-10-CM | POA: Insufficient documentation

## 2016-02-02 DIAGNOSIS — Y999 Unspecified external cause status: Secondary | ICD-10-CM | POA: Diagnosis not present

## 2016-02-02 DIAGNOSIS — S8252XA Displaced fracture of medial malleolus of left tibia, initial encounter for closed fracture: Secondary | ICD-10-CM | POA: Diagnosis not present

## 2016-02-02 IMAGING — CR DG ANKLE COMPLETE 3+V*L*
3 series · 3 of 3 positions shown · non-contrast
Comparison: None.

CLINICAL DATA: Pain after trauma.

EXAM:
LEFT ANKLE COMPLETE - 3+ VIEW

[x ankle ap left]
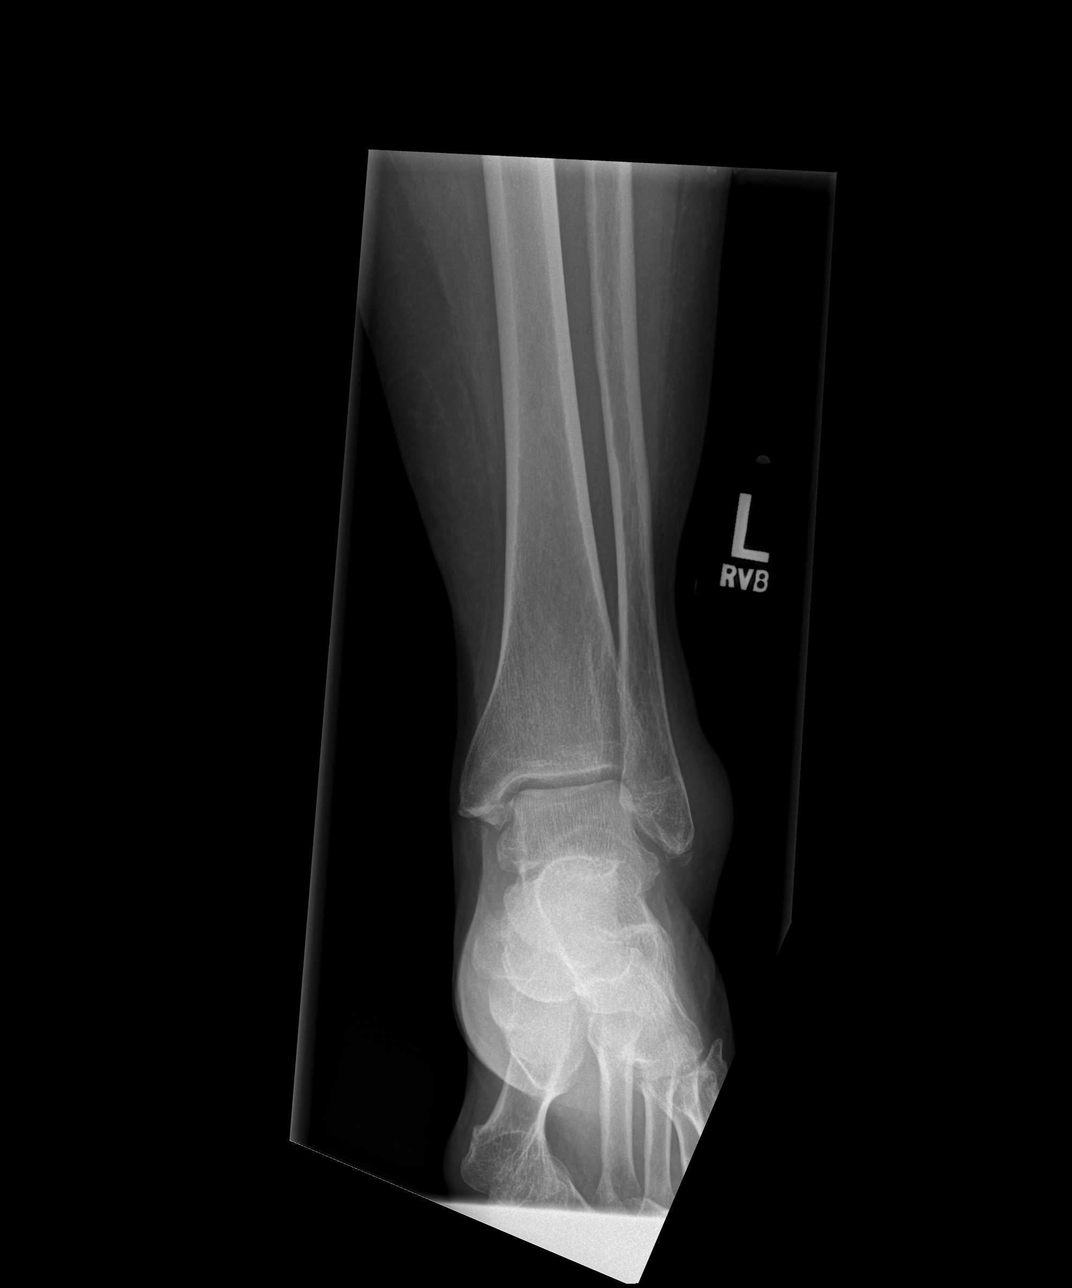

[x ankle obl left]
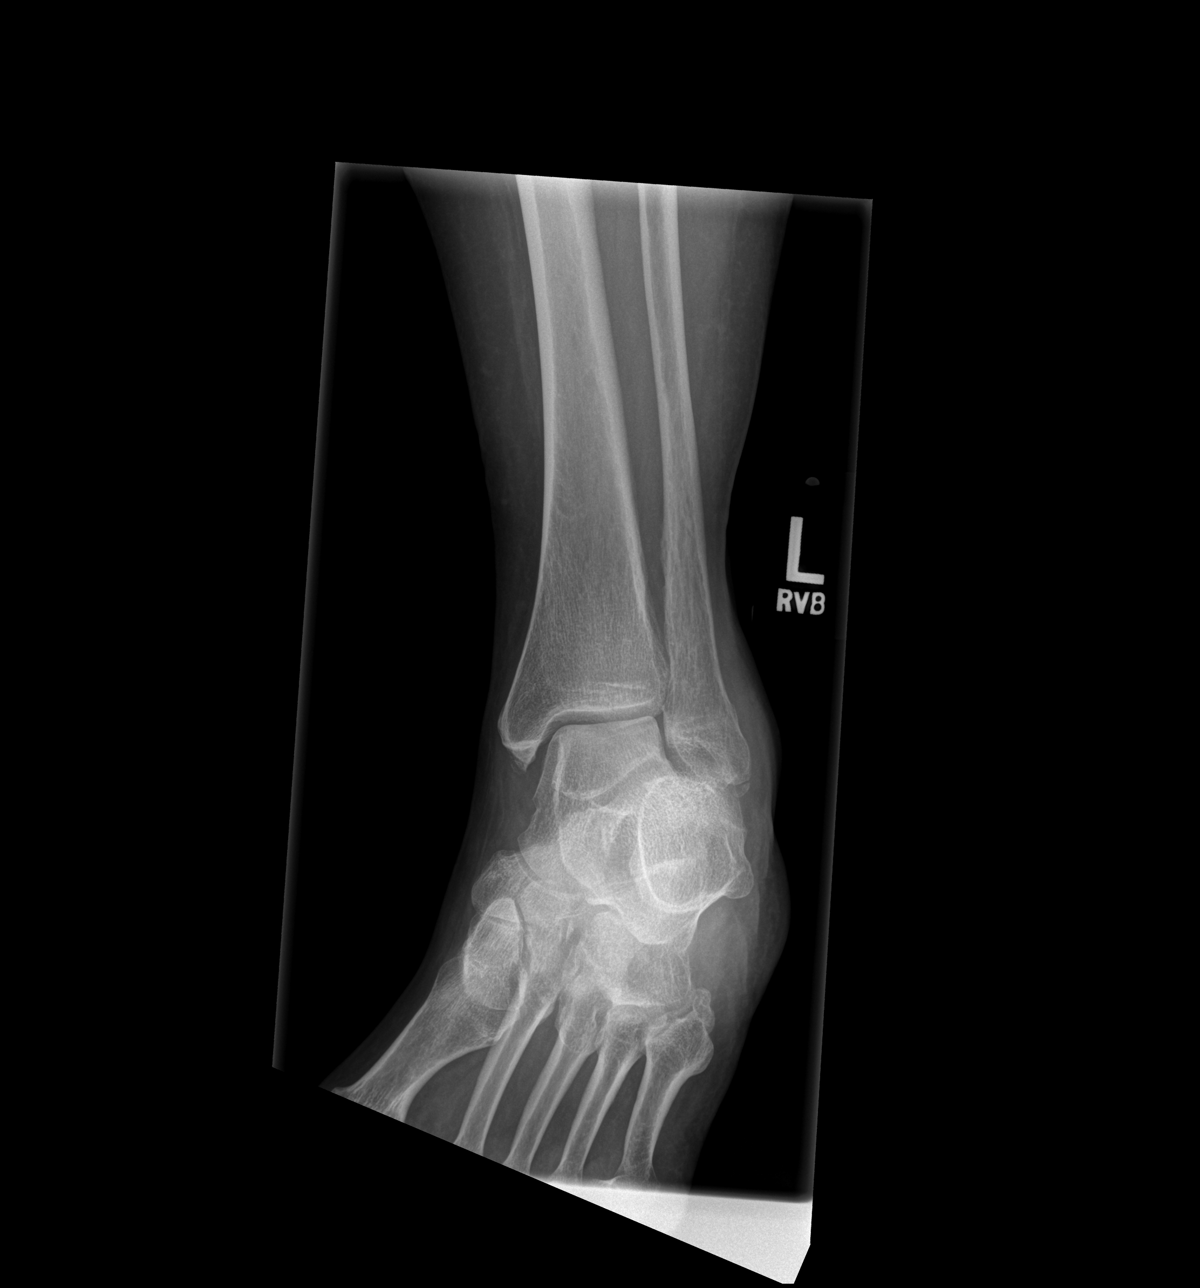

[x ankle lat left]
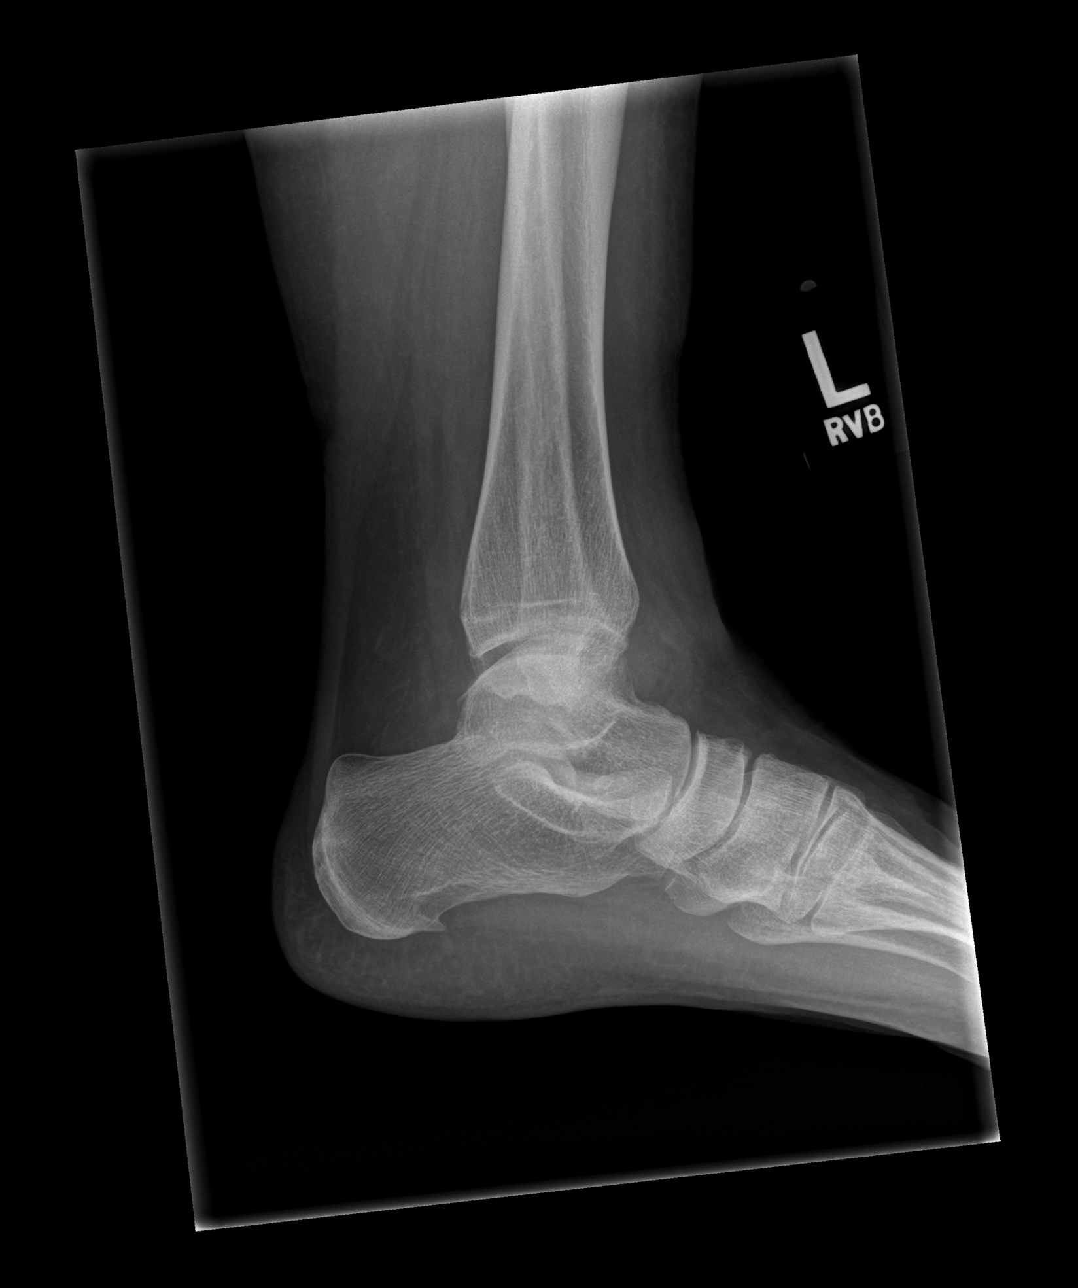

[3 of 3 positions shown; findings below may reference images not displayed]

FINDINGS: Well corticated calcifications adjacent to the medial malleolus are
consistent with previous avulsion injury. A similar finding is seen
adjacent to the distal fibula. One of the calcifications adjacent to
the distal fibula demonstrates less cortication and could represent
an acute on chronic avulsion injury. The remainder of the distal
fibula is intact. There is lateral soft tissue swelling. The ankle
mortise is intact. No other significant abnormalities.
IMPRESSION: 1. Evidence of remote bilateral malleolar avulsion injuries. A
calcification adjacent to the distal fibula is less well corticated
than the others and could represent an acute on chronic avulsion
injury.

## 2016-02-02 MED ORDER — OXYCODONE-ACETAMINOPHEN 5-325 MG PO TABS: 1.0000 | ORAL_TABLET | ORAL | 0 refills | Status: DC | PRN

## 2016-02-02 NOTE — ED Provider Notes (Signed)
[facility] DEPT Provider Note    By signing my name below, I, Ashley Mayo, attest that this documentation has been prepared under the direction and in the presence of [facility] PA-C. Electronically Signed: Bea Mayo, ED Scribe. 02/02/16. 5:32 PM.   History   Chief Complaint Chief Complaint  Patient presents with  . Ankle Pain  . Medication Refill    The history is provided by the patient and medical records. No language interpreter was used.    HPI Comments:  Ashley Mayo is a 50 y.o. female with PMHx of metastatic lung cancer who presents to the Emergency Department complaining of left ankle pain that began earlier today secondary to twisting it. She reports associated swelling of the ankle. Bearing weight increases the pain. She denies alleviating factors. She denies numbness, tingling or weakness of the right foot, ankle or leg, knee or hip pain, head injury, LOC, SOB or wounds.   Pt recent admission to the hospital for HCAP and bilateral TM perforation. She is overall feeling much better, is still coughing and still has ear pain and decreased hearing but overall is improved.    Pt also requests a refill of her Percocet.  She is on chronic pain medication for her metastatic cancer.  She is from [facility], [place] and called her PCP/oncologist but they stated they could not refill it since she is out of state.   She was delayed in [place] home as she was in the hospital on the day she was supposed to fly out.   Past Medical History:  Diagnosis Date  . Anxiety   . Asthma   . Cancer ([place])    lung cancer  . COPD (chronic obstructive pulmonary disease) ([place])   . Depression   . GERD (gastroesophageal reflux disease)   . Hypertension     Patient Active Problem List   Diagnosis Date Noted  . Perforated ear drum, bilateral 01/25/2016  . Pneumonia 01/23/2016  . Healthcare-associated pneumonia 01/23/2016  . Hypokalemia 01/23/2016  . Anxiety 01/23/2016  .  Depression 01/23/2016  . Metastatic lung cancer (metastasis from lung to other site) ([place]) 01/23/2016  . Brain metastasis ([place]) 01/23/2016  . Essential hypertension 01/23/2016    Past Surgical History:  Procedure Laterality Date  . ABDOMINAL HYSTERECTOMY    . CESAREAN SECTION    . ORIF right hip fracture      OB History    No data available       Home Medications    Prior to Admission medications   Medication Sig Start Date End Date Taking? Authorizing Provider  acetaminophen (TYLENOL) 500 MG tablet Take 1,000 mg by mouth every 6 (six) hours as needed for mild pain, moderate pain, fever or headache.    Historical Provider, MD  ADDERALL XR 30 MG 24 hr capsule Take 30 mg by mouth daily. 01/08/16   Historical Provider, MD  ADVAIR DISKUS 500-50 MCG/DOSE AEPB Take 1 puff by mouth 2 (two) times daily as needed (for shortness of breath).  12/20/15   Historical Provider, MD  albuterol (PROVENTIL) (2.5 MG/3ML) 0.083% nebulizer solution Take 2.5 mg by nebulization every 3 (three) hours as needed for wheezing or shortness of breath.    Historical Provider, MD  buPROPion (WELLBUTRIN SR) 200 MG 12 hr tablet Take 200 mg by mouth 2 (two) times daily.    Historical Provider, MD  ciprofloxacin-dexamethasone (CIPRODEX) otic suspension Place 4 drops into both ears 2 (two) times daily. 01/27/16   Annita Brod, MD  dextromethorphan-guaiFENesin (MUCINEX DM) 30-600 MG 12hr tablet Take 1 tablet by mouth 2 (two) times daily as needed for cough.    Historical Provider, MD  lidocaine (LIDODERM) 5 % Place 1-2 patches onto the skin daily. 01/13/16   Historical Provider, MD  LORazepam (ATIVAN) 0.5 MG tablet Take 0.5 mg by mouth every 6 (six) hours as needed for anxiety. 12/26/15   Historical Provider, MD  metoprolol succinate (TOPROL-XL) 25 MG 24 hr tablet Take 25 mg by mouth daily.    Historical Provider, MD  montelukast (SINGULAIR) 10 MG tablet Take 10 mg by mouth daily.    Historical Provider, MD  nystatin  (MYCOSTATIN) 100000 UNIT/ML suspension Take 5 mLs by mouth 4 (four) times daily as needed (for thrush).  11/01/15   Historical Provider, MD  oxyCODONE-acetaminophen (PERCOCET/ROXICET) 5-325 MG tablet Take 1-2 tablets by mouth every 4 (four) hours as needed for moderate pain or severe pain. 02/02/16   Clayton Bibles, PA-C  pantoprazole (PROTONIX) 40 MG tablet Take 40 mg by mouth daily. 01/06/16   Historical Provider, MD  pembrolizumab (KEYTRUDA) 100 MG/4ML SOLN Inject 2 mg/kg into the vein every 21 ( twenty-one) days.    Historical Provider, MD  PROAIR HFA 108 [phone] Base) MCG/ACT inhaler Take 2 puffs by mouth 2 (two) times daily as needed for wheezing or shortness of breath. 01/10/16   Historical Provider, MD  valACYclovir (VALTREX) 500 MG tablet Take 500 mg by mouth 2 (two) times daily as needed (for cold sores).  11/19/15   Historical Provider, MD    Family History Family History  Problem Relation Age of Onset  . Breast cancer Mother     Social History Social History  Substance Use Topics  . Smoking status: Former Smoker    Packs/day: 0.25    Years: 35.00    Types: Cigarettes  . Smokeless tobacco: Never Used  . Alcohol use No     Allergies   Codeine and Shellfish allergy   Review of Systems Review of Systems  Constitutional: Negative for fever.  HENT: Positive for ear pain and hearing loss. Negative for facial swelling and sore throat.   Respiratory: Positive for cough. Negative for shortness of breath.   Musculoskeletal: Positive for arthralgias and joint swelling.  Skin: Positive for color change. Negative for wound.  Neurological: Negative for syncope, weakness and numbness.  Psychiatric/Behavioral: Negative for self-injury.     Physical Exam Updated Vital Signs BP 140/98 (BP Location: Left Arm)   Pulse 85   Temp 98.1 F (36.7 C) (Oral)   Resp 20   SpO2 100%   Physical Exam  Constitutional: She appears well-developed and well-nourished. No distress.  HENT:  Head:  Normocephalic and atraumatic.  Right Ear: Tympanic membrane and ear canal normal.  Left Ear: Ear canal normal.  Bilateral ear canals clear. Left TM with single spot of dried blood or healing perforation. Right TM unremarkable.  Neck: Neck supple.  Cardiovascular: Normal rate, regular rhythm and normal heart sounds.  Exam reveals no gallop and no friction rub.   No murmur heard. Distal pulses intact.  Pulmonary/Chest: Effort normal and breath sounds normal. No respiratory distress. She has no wheezes. She has no rales.  Musculoskeletal: She exhibits edema and tenderness. She exhibits no deformity.  Left lateral malleolus with edema, mild ecchymosis and tenderness. No other focal tenderness of LLE.  Neurological: She is alert.  Distal sensations intact.  Skin: She is not diaphoretic.  Nursing note and vitals reviewed.    ED Treatments /  Results  DIAGNOSTIC STUDIES: Oxygen Saturation is 100% on RA, normal by my interpretation.   COORDINATION OF CARE: 5:24 PM- Will X-Ray left ankle. Pt verbalizes understanding and agrees to plan.  Medications - No data to display  Labs (all labs ordered are listed, but only abnormal results are displayed) Labs Reviewed - No data to display  EKG  EKG Interpretation None       Radiology Dg Ankle Complete Left  Result Date: 02/02/2016 CLINICAL DATA:  Pain after trauma. EXAM: LEFT ANKLE COMPLETE - 3+ VIEW COMPARISON:  None. FINDINGS: Well corticated calcifications adjacent to the medial malleolus are consistent with previous avulsion injury. A similar finding is seen adjacent to the distal fibula. One of the calcifications adjacent to the distal fibula demonstrates less cortication and could represent an acute on chronic avulsion injury. The remainder of the distal fibula is intact. There is lateral soft tissue swelling. The ankle mortise is intact. No other significant abnormalities. IMPRESSION: 1. Evidence of remote bilateral malleolar avulsion  injuries. A calcification adjacent to the distal fibula is less well corticated than the others and could represent an acute on chronic avulsion injury. Electronically Signed   By: Dorise Bullion III M.D   On: 02/02/2016 17:29    Procedures Procedures (including critical care time)  Medications Ordered in ED Medications - No data to display   Initial Impression / Assessment and Plan / ED Course  I have reviewed the triage vital signs and the nursing notes.  Pertinent labs & imaging results that were available during my care of the patient were reviewed by me and considered in my medical decision making (see chart for details).  Clinical Course    Afebrile, nontoxic patient with injury to her right ankle while walking the dog and slipping on ice.   Xray demonstrates probable acute avulsion injury.  Pt also is from out of state visiting her son and has metastatic cancer, has run out of her percocet - delay in getting home due to recent hospitalization at this hospital.   D/C home with CAM walker, #30 percocet, local resources to help her until she can get back home.  Discussed result, findings, treatment, and follow up  with patient.  Pt given return precautions.  Pt verbalizes understanding and agrees with plan.      I personally performed the services described in this documentation, which was scribed in my presence. The recorded information has been reviewed and is accurate.   Final Clinical Impressions(s) / ED Diagnoses   Final diagnoses:  Medication refill  Avulsion fracture of ankle, left, closed, initial encounter    New Prescriptions Discharge Medication List as of 02/02/2016  5:44 PM       Clayton Bibles, PA-C 02/02/16 1922    Sherwood Gambler, MD 02/04/16 1240

## 2016-02-02 NOTE — ED Triage Notes (Signed)
Pt here from out of town, states she had to stay longer due to beng hospitalized and ran out of her prescribed percocet  Pt also perforated her ear drums and cannot fly home. Pt is being treated for lung and brain cancer.  Pt states she also twisted her left ankle earlier today and has some pain and swelling.

## 2016-02-02 NOTE — ED Notes (Signed)
ORTHO TECH NOTIFIED.

## 2016-02-02 NOTE — ED Notes (Signed)
PT DISCHARGED. INSTRUCTIONS AND PRESCRIPTION GIVEN. AAOX4. PT IN NO APPARENT DISTRESS. THE OPPORTUNITY TO ASK QUESTIONS WAS PROVIDED. 

## 2016-02-02 NOTE — Discharge Instructions (Signed)
Read the information below.  Use the prescribed medication as directed.  Please discuss all new medications with your pharmacist.  You may return to the Emergency Department at any time for worsening condition or any new symptoms that concern you.    °

## 2016-02-07 ENCOUNTER — Emergency Department (HOSPITAL_COMMUNITY)
Admission: EM | Admit: 2016-02-07 | Discharge: 2016-02-07 | Disposition: A | Discharge status: Home / Self Care | Payer: Medicaid Other | Attending: Emergency Medicine | Admitting: Emergency Medicine

## 2016-02-07 ENCOUNTER — Encounter (HOSPITAL_COMMUNITY): Payer: Self-pay | Admitting: Emergency Medicine

## 2016-02-07 DIAGNOSIS — Y929 Unspecified place or not applicable: Secondary | ICD-10-CM | POA: Insufficient documentation

## 2016-02-07 DIAGNOSIS — S9032XA Contusion of left foot, initial encounter: Secondary | ICD-10-CM | POA: Insufficient documentation

## 2016-02-07 DIAGNOSIS — Z85841 Personal history of malignant neoplasm of brain: Secondary | ICD-10-CM | POA: Insufficient documentation

## 2016-02-07 DIAGNOSIS — Z79899 Other long term (current) drug therapy: Secondary | ICD-10-CM | POA: Insufficient documentation

## 2016-02-07 DIAGNOSIS — Z87891 Personal history of nicotine dependence: Secondary | ICD-10-CM | POA: Diagnosis not present

## 2016-02-07 DIAGNOSIS — Z85118 Personal history of other malignant neoplasm of bronchus and lung: Secondary | ICD-10-CM | POA: Insufficient documentation

## 2016-02-07 DIAGNOSIS — Y999 Unspecified external cause status: Secondary | ICD-10-CM | POA: Insufficient documentation

## 2016-02-07 DIAGNOSIS — I10 Essential (primary) hypertension: Secondary | ICD-10-CM | POA: Diagnosis not present

## 2016-02-07 DIAGNOSIS — S99912A Unspecified injury of left ankle, initial encounter: Secondary | ICD-10-CM | POA: Diagnosis present

## 2016-02-07 DIAGNOSIS — Z760 Encounter for issue of repeat prescription: Secondary | ICD-10-CM | POA: Insufficient documentation

## 2016-02-07 DIAGNOSIS — J449 Chronic obstructive pulmonary disease, unspecified: Secondary | ICD-10-CM | POA: Insufficient documentation

## 2016-02-07 DIAGNOSIS — Y9301 Activity, walking, marching and hiking: Secondary | ICD-10-CM | POA: Insufficient documentation

## 2016-02-07 DIAGNOSIS — W000XXA Fall on same level due to ice and snow, initial encounter: Secondary | ICD-10-CM | POA: Diagnosis not present

## 2016-02-07 DIAGNOSIS — M25572 Pain in left ankle and joints of left foot: Secondary | ICD-10-CM

## 2016-02-07 MED ORDER — BUPROPION HCL ER (SR) 200 MG PO TB12: 200.0000 mg | ORAL_TABLET | Freq: Two times a day (BID) | ORAL | 0 refills | Status: DC

## 2016-02-07 MED ORDER — OXYCODONE-ACETAMINOPHEN 5-325 MG PO TABS: 1.0000 | ORAL_TABLET | Freq: Four times a day (QID) | ORAL | 0 refills | Status: DC | PRN

## 2016-02-07 MED FILL — BUPROPION HCL SR 200 MG TAB: 200 | 15 days supply | Qty: 30 | Fill #0

## 2016-02-07 MED FILL — OXYCODONE W/APAP 5/325 TAB: 5-325 | 2 days supply | Qty: 15 | Fill #0

## 2016-02-07 NOTE — ED Triage Notes (Signed)
Pt reports recurrent pain in l/ankle. Took last dosage of Percocet yesterday. Also requesting refill for Wellbutrin.. Pt is alert, oriented and cooperative. Ambulatory with cam walker

## 2016-02-07 NOTE — Discharge Instructions (Signed)
Please follow up closely with your provider for further management of your condition.

## 2016-02-07 NOTE — ED Provider Notes (Signed)
[facility] DEPT Provider Note   CSN: [id number] Arrival date & time: 02/07/16  1150  By signing my name below, I, Soijett Blue, attest that this documentation has been prepared under the direction and in the presence of Domenic Moras, PA-C Electronically Signed: Soijett Blue, ED Scribe. 02/07/16. 12:27 PM.  History   Chief Complaint Chief Complaint  Patient presents with  . Ankle Pain    l/ankle pain    HPI Ashley Mayo is a 50 y.o. female with a PMHx of lung CA, HTN, who presents to the Emergency Department complaining of 10/10, constant, left ankle pain onset 1 week ago. Pt notes that she initially injured her left ankle while walking her dog outside and slipping on ice. Pt states that she lives in [place], and she is still in [place] due to the recent inclement weather and recently purchasing a car. Pt notes that she will be returning to [place] next week. Pt also voices that she would like a prescription of her Wellbutrin that she has ran out of. Pt is having associated symptoms of gait problem due to pain. She notes that she has tried ibuprofen, tylenol, and percocet with no relief of her symptoms. She denies fever, chills, wound, and any other symptoms.    Per pt chart review: Pt was seen in the ED on 02/02/2016 for left ankle pain. Pt had left ankle xray imaging completed with "Evidence of remote bilateral malleolar avulsion injuries. A calcification adjacent to the distal fibula is less well corticated than the others and could represent an acute on chronic avulsion Injury" results. Pt was Rx 30 percocet for their symptoms. Pt called her cardiothoracic surgeon office at [facility] in [place] on 02/06/2016 in order to inform them that she will miss her appointment due to her left ankle being broken. Pt was rescheduled with [facility] in [place] for an appointment on 02/21/2016.  The history is  provided by the patient. No language interpreter was used.    Past Medical History:  Diagnosis Date  . Anxiety   . Asthma   . Cancer ([place])    lung cancer  . COPD (chronic obstructive pulmonary disease) ([place])   . Depression   . GERD (gastroesophageal reflux disease)   . Hypertension     Patient Active Problem List   Diagnosis Date Noted  . Perforated ear drum, bilateral 01/25/2016  . Pneumonia 01/23/2016  . Healthcare-associated pneumonia 01/23/2016  . Hypokalemia 01/23/2016  . Anxiety 01/23/2016  . Depression 01/23/2016  . Metastatic lung cancer (metastasis from lung to other site) ([place]) 01/23/2016  . Brain metastasis ([place]) 01/23/2016  . Essential hypertension 01/23/2016    Past Surgical History:  Procedure Laterality Date  . ABDOMINAL HYSTERECTOMY    . CESAREAN SECTION    . ORIF right hip fracture      OB History    No data available       Home Medications    Prior to Admission medications   Medication Sig Start Date End Date Taking? Authorizing Provider  acetaminophen (TYLENOL) 500 MG tablet Take 1,000 mg by mouth every 6 (six) hours as needed for mild pain, moderate pain, fever or headache.    Historical Provider, MD  ADDERALL XR 30 MG 24 hr capsule Take 30 mg by mouth daily. 01/08/16   Historical Provider, MD  ADVAIR DISKUS 500-50 MCG/DOSE AEPB Take 1 puff by mouth 2 (two) times daily as needed (for shortness of  breath).  12/20/15   Historical Provider, MD  albuterol (PROVENTIL) (2.5 MG/3ML) 0.083% nebulizer solution Take 2.5 mg by nebulization every 3 (three) hours as needed for wheezing or shortness of breath.    Historical Provider, MD  buPROPion (WELLBUTRIN SR) 200 MG 12 hr tablet Take 200 mg by mouth 2 (two) times daily.    Historical Provider, MD  ciprofloxacin-dexamethasone (CIPRODEX) otic suspension Place 4 drops into both ears 2 (two) times daily. 01/27/16   Annita Brod, MD  dextromethorphan-guaiFENesin (MUCINEX DM) 30-600 MG 12hr tablet Take 1  tablet by mouth 2 (two) times daily as needed for cough.    Historical Provider, MD  lidocaine (LIDODERM) 5 % Place 1-2 patches onto the skin daily. 01/13/16   Historical Provider, MD  LORazepam (ATIVAN) 0.5 MG tablet Take 0.5 mg by mouth every 6 (six) hours as needed for anxiety. 12/26/15   Historical Provider, MD  metoprolol succinate (TOPROL-XL) 25 MG 24 hr tablet Take 25 mg by mouth daily.    Historical Provider, MD  montelukast (SINGULAIR) 10 MG tablet Take 10 mg by mouth daily.    Historical Provider, MD  nystatin (MYCOSTATIN) 100000 UNIT/ML suspension Take 5 mLs by mouth 4 (four) times daily as needed (for thrush).  11/01/15   Historical Provider, MD  oxyCODONE-acetaminophen (PERCOCET/ROXICET) 5-325 MG tablet Take 1-2 tablets by mouth every 4 (four) hours as needed for moderate pain or severe pain. 02/02/16   Clayton Bibles, PA-C  pantoprazole (PROTONIX) 40 MG tablet Take 40 mg by mouth daily. 01/06/16   Historical Provider, MD  pembrolizumab (KEYTRUDA) 100 MG/4ML SOLN Inject 2 mg/kg into the vein every 21 ( twenty-one) days.    Historical Provider, MD  PROAIR HFA 108 [phone] Base) MCG/ACT inhaler Take 2 puffs by mouth 2 (two) times daily as needed for wheezing or shortness of breath. 01/10/16   Historical Provider, MD  valACYclovir (VALTREX) 500 MG tablet Take 500 mg by mouth 2 (two) times daily as needed (for cold sores).  11/19/15   Historical Provider, MD    Family History Family History  Problem Relation Age of Onset  . Breast cancer Mother     Social History Social History  Substance Use Topics  . Smoking status: Former Smoker    Packs/day: 0.25    Years: 35.00    Types: Cigarettes  . Smokeless tobacco: Never Used  . Alcohol use No     Allergies   Codeine and Shellfish allergy   Review of Systems Review of Systems  Constitutional: Negative for chills and fever.  Musculoskeletal: Positive for arthralgias (left ankle) and gait problem (due to pain).  Skin: Negative for wound.      Physical Exam Updated Vital Signs BP (!) 125/109 (BP Location: Left Arm) Comment: Pt states out of wellbutrin and in pain  Pulse 87   Temp 97.8 F (36.6 C) (Oral)   Resp 18   Ht '5\' 4"'$  (1.626 m)   Wt 178 lb 11.2 oz (81.1 kg)   SpO2 100%   BMI 30.67 kg/m   Physical Exam  Constitutional: She is oriented to person, place, and time. She appears well-developed and well-nourished. No distress.  HENT:  Head: Normocephalic and atraumatic.  Eyes: EOM are normal.  Neck: Neck supple.  Cardiovascular: Normal rate.   Pulmonary/Chest: Effort normal. No respiratory distress.  Abdominal: She exhibits no distension.  Musculoskeletal: Normal range of motion.       Left ankle: She exhibits swelling. She exhibits no deformity. Tenderness. Lateral malleolus  tenderness found.       Left foot: There is tenderness. There is normal capillary refill.  Left ankle with moderate tendeness to lateral malleolar regions with associated edema. No gross deformity. Tenderness to dorsum of left foot most significant over second and third metatarsal region. Faint ecchymosis noted. Dorsalis pedis palpable. Brisk cap refill to all toes.   Neurological: She is alert and oriented to person, place, and time.  Skin: Skin is warm and dry.  Psychiatric: She has a normal mood and affect. Her behavior is normal.  Nursing note and vitals reviewed.    ED Treatments / Results  DIAGNOSTIC STUDIES: Oxygen Saturation is 100% on RA, nl by my interpretation.    COORDINATION OF CARE: 12:21 PM Discussed treatment plan with pt at bedside which includes percocet Rx, refill Wellbutrin Rx and pt agreed to plan.   Procedures Procedures (including critical care time)  Medications Ordered in ED Medications - No data to display   Initial Impression / Assessment and Plan / ED Course  I have reviewed the triage vital signs and the nursing notes.  Clinical Course     BP 141/100 (BP Location: Right Arm)   Pulse 77   Temp  97.8 F (36.6 C) (Oral)   Resp 18   Ht '5\' 4"'$  (1.626 m)   Wt 80.7 kg   SpO2 98%   BMI 30.55 kg/m    Final Clinical Impressions(s) / ED Diagnoses   Final diagnoses:  Left ankle pain, unspecified chronicity  Medication refill    New Prescriptions Current Discharge Medication List     I personally performed the services described in this documentation, which was scribed in my presence. The recorded information has been reviewed and is accurate.   Pt with hx of metastatic lung cancer, recently reinjured her L ankle, here requesting pain medication.  She lives in [place] and is actively trying to return home.  She did received 30 percocets during her last ER visit 5 days ago. She did ran out of her pain meds.  Given hx of cancer, and recent ankle fracture, I agree to provide pt an additional 15 percocets as well as refill her Wellbutrin.      Domenic Moras, PA-C 02/07/16 1230    Merrily Pew, MD 02/08/16 1009

## 2016-02-10 ENCOUNTER — Emergency Department (HOSPITAL_COMMUNITY)
Admission: EM | Admit: 2016-02-10 | Discharge: 2016-02-10 | Disposition: A | Discharge status: Home / Self Care | Payer: Medicaid Other | Attending: Emergency Medicine | Admitting: Emergency Medicine

## 2016-02-10 ENCOUNTER — Encounter (HOSPITAL_COMMUNITY): Payer: Self-pay | Admitting: *Deleted

## 2016-02-10 DIAGNOSIS — J449 Chronic obstructive pulmonary disease, unspecified: Secondary | ICD-10-CM | POA: Insufficient documentation

## 2016-02-10 DIAGNOSIS — Z79899 Other long term (current) drug therapy: Secondary | ICD-10-CM | POA: Insufficient documentation

## 2016-02-10 DIAGNOSIS — Z85118 Personal history of other malignant neoplasm of bronchus and lung: Secondary | ICD-10-CM | POA: Insufficient documentation

## 2016-02-10 DIAGNOSIS — Z760 Encounter for issue of repeat prescription: Secondary | ICD-10-CM | POA: Diagnosis not present

## 2016-02-10 DIAGNOSIS — Z87891 Personal history of nicotine dependence: Secondary | ICD-10-CM | POA: Diagnosis not present

## 2016-02-10 DIAGNOSIS — I10 Essential (primary) hypertension: Secondary | ICD-10-CM | POA: Diagnosis not present

## 2016-02-10 MED ORDER — OXYCODONE-ACETAMINOPHEN 5-325 MG PO TABS: 1.0000 | ORAL_TABLET | Freq: Four times a day (QID) | ORAL | 0 refills | Status: DC | PRN

## 2016-02-10 MED FILL — OXYCODONE W/APAP 5/325 TAB: 5-325 | 3 days supply | Qty: 30 | Fill #0

## 2016-02-10 NOTE — Discharge Instructions (Signed)
You have been given a medication refill today. Please follow up with your oncologist as soon as possible once you have returned to [place].

## 2016-02-10 NOTE — ED Triage Notes (Signed)
Pt is here to request refill for percocet

## 2016-02-10 NOTE — ED Provider Notes (Signed)
[facility] DEPT Provider Note   CSN: [id number] Arrival date & time: 02/10/16  1103  By signing my name below, I, Ashley Mayo, attest that this documentation has been prepared under the direction and in the presence of Ashley Mayo C. Ladarrion Telfair, PA-C. Electronically Signed: Rayna Mayo, ED Scribe. 02/10/16. 11:51 AM.   History   Chief Complaint Chief Complaint  Patient presents with  . Medication Refill   HPI HPI Comments: Ashley Mayo is a 50 y.o. female who presents to the Emergency Department requesting a medication refill of percocet. Pt has a h/o metastatic lung cancer and recently injured her left ankle. Pt is from [place], came to [place] on vacation to eventually move to the state and while here, was hospitalized for pneumonia and broke her left ankle. Patient states, "I've purchased a car and am awaiting the registration to return to [place]." Patient states she has called her oncologist but was told she should come to the ED for her medications because her oncologist cannot prescribe over state lines. She is complaining of pain due to her cancer and in her left ankle.  Pt states she plans on being back in [place] on 02/17/2016 and she states she has follow-up with her oncologist next week. Pt received 30 percocets 8 days ago and another 15 percocets 3 days ago, both from prescribers in [place]. Per narcotics database, pt was prescribed 180 5-325 percocets on 12/26/2015 and was given this amount on a monthly basis this year. She states she is currently on immunotherapy and is no longer on chemotherapy. Her next appointment with her oncologist in [place] is on 02/19/2016.    The history is provided by the patient and medical records. No language interpreter was used.    Past Medical History:  Diagnosis Date  . Anxiety   . Asthma   . Cancer ([place])    lung cancer  . COPD (chronic obstructive pulmonary disease) ([place])   . Depression   . GERD (gastroesophageal  reflux disease)   . Hypertension     Patient Active Problem List   Diagnosis Date Noted  . Perforated ear drum, bilateral 01/25/2016  . Pneumonia 01/23/2016  . Healthcare-associated pneumonia 01/23/2016  . Hypokalemia 01/23/2016  . Anxiety 01/23/2016  . Depression 01/23/2016  . Metastatic lung cancer (metastasis from lung to other site) ([facility]) 01/23/2016  . Brain metastasis ([place]) 01/23/2016  . Essential hypertension 01/23/2016    Past Surgical History:  Procedure Laterality Date  . ABDOMINAL HYSTERECTOMY    . CESAREAN SECTION    . ORIF right hip fracture      OB History    No data available       Home Medications    Prior to Admission medications   Medication Sig Start Date End Date Taking? Authorizing Provider  acetaminophen (TYLENOL) 500 MG tablet Take 1,000 mg by mouth every 6 (six) hours as needed for mild pain, moderate pain, fever or headache.    Historical Provider, MD  ADDERALL XR 30 MG 24 hr capsule Take 30 mg by mouth daily. 01/08/16   Historical Provider, MD  ADVAIR DISKUS 500-50 MCG/DOSE AEPB Take 1 puff by mouth 2 (two) times daily as needed (for shortness of breath).  12/20/15   Historical Provider, MD  albuterol (PROVENTIL) (2.5 MG/3ML) 0.083% nebulizer solution Take 2.5 mg by nebulization every 3 (three) hours as needed for wheezing or shortness of breath.    Historical Provider, MD  buPROPion (WELLBUTRIN SR) 200 MG 12 hr tablet Take 1 tablet (  200 mg total) by mouth 2 (two) times daily. 02/07/16   Domenic Moras, PA-C  ciprofloxacin-dexamethasone (CIPRODEX) otic suspension Place 4 drops into both ears 2 (two) times daily. 01/27/16   Annita Brod, MD  dextromethorphan-guaiFENesin (MUCINEX DM) 30-600 MG 12hr tablet Take 1 tablet by mouth 2 (two) times daily as needed for cough.    Historical Provider, MD  lidocaine (LIDODERM) 5 % Place 1-2 patches onto the skin daily. 01/13/16   Historical Provider, MD  LORazepam (ATIVAN) 0.5 MG tablet Take 0.5 mg by mouth every  6 (six) hours as needed for anxiety. 12/26/15   Historical Provider, MD  metoprolol succinate (TOPROL-XL) 25 MG 24 hr tablet Take 25 mg by mouth daily.    Historical Provider, MD  montelukast (SINGULAIR) 10 MG tablet Take 10 mg by mouth daily.    Historical Provider, MD  nystatin (MYCOSTATIN) 100000 UNIT/ML suspension Take 5 mLs by mouth 4 (four) times daily as needed (for thrush).  11/01/15   Historical Provider, MD  oxyCODONE-acetaminophen (PERCOCET/ROXICET) 5-325 MG tablet Take 1-2 tablets by mouth every 6 (six) hours as needed for moderate pain or severe pain. 02/07/16   Domenic Moras, PA-C  oxyCODONE-acetaminophen (PERCOCET/ROXICET) 5-325 MG tablet Take 1-2 tablets by mouth every 6 (six) hours as needed for severe pain. 02/10/16   Norvil Martensen C Corbyn Steedman, PA-C  pantoprazole (PROTONIX) 40 MG tablet Take 40 mg by mouth daily. 01/06/16   Historical Provider, MD  pembrolizumab (KEYTRUDA) 100 MG/4ML SOLN Inject 2 mg/kg into the vein every 21 ( twenty-one) days.    Historical Provider, MD  PROAIR HFA 108 [phone] Base) MCG/ACT inhaler Take 2 puffs by mouth 2 (two) times daily as needed for wheezing or shortness of breath. 01/10/16   Historical Provider, MD  valACYclovir (VALTREX) 500 MG tablet Take 500 mg by mouth 2 (two) times daily as needed (for cold sores).  11/19/15   Historical Provider, MD    Family History Family History  Problem Relation Age of Onset  . Breast cancer Mother     Social History Social History  Substance Use Topics  . Smoking status: Former Smoker    Packs/day: 0.25    Years: 35.00    Types: Cigarettes  . Smokeless tobacco: Never Used  . Alcohol use No     Allergies   Codeine and Shellfish allergy   Review of Systems Review of Systems  Musculoskeletal: Positive for arthralgias and myalgias.  All other systems reviewed and are negative.  Physical Exam Updated Vital Signs BP (!) 156/110   Pulse 83   Temp 98.4 F (36.9 C) (Oral)   Resp 18   SpO2 98%   Physical Exam    Constitutional: She appears well-developed and well-nourished. No distress.  HENT:  Head: Normocephalic and atraumatic.  Eyes: Conjunctivae are normal.  Neck: Neck supple.  Cardiovascular: Normal rate, regular rhythm, normal heart sounds and intact distal pulses.   Pulmonary/Chest: Effort normal and breath sounds normal. No respiratory distress.  Abdominal: Soft. There is no tenderness. There is no guarding.  Musculoskeletal: She exhibits no edema.  Lymphadenopathy:    She has no cervical adenopathy.  Neurological: She is alert.  Skin: Skin is warm and dry. She is not diaphoretic.  Psychiatric: She has a normal mood and affect. Her behavior is normal.  Nursing note and vitals reviewed.  ED Treatments / Results  Labs (all labs ordered are listed, but only abnormal results are displayed) Labs Reviewed - No data to display  EKG  EKG  Interpretation None       Radiology No results found.  Procedures Procedures  COORDINATION OF CARE: 11:51 AM Discussed next steps with pt. Pt verbalized understanding and is agreeable with the plan.    Medications Ordered in ED Medications - No data to display   Initial Impression / Assessment and Plan / ED Course  I have reviewed the triage vital signs and the nursing notes.  Pertinent labs & imaging results that were available during my care of the patient were reviewed by me and considered in my medical decision making (see chart for details).  Clinical Course     Patient presents requesting narcotic analgesics due to being outside of her home state and away from her oncologist. The patient's timeline and her reported narcotic prescriptions were verified through our records review and searches on the narcotic databases.    I personally performed the services described in this documentation, which was scribed in my presence. The recorded information has been reviewed and is accurate. Final Clinical Impressions(s) / ED Diagnoses    Final diagnoses:  Medication refill    New Prescriptions Discharge Medication List as of 02/10/2016 11:55 AM    START taking these medications   Details  !! oxyCODONE-acetaminophen (PERCOCET/ROXICET) 5-325 MG tablet Take 1-2 tablets by mouth every 6 (six) hours as needed for severe pain., Starting Mon 02/10/2016, Print     !! - Potential duplicate medications found. Please discuss with provider.       Lorayne Bender, PA-C 02/11/16 1856    Lacretia Leigh, MD 02/11/16 [phone]

## 2016-02-14 ENCOUNTER — Emergency Department (HOSPITAL_COMMUNITY)
Admission: EM | Admit: 2016-02-14 | Discharge: 2016-02-14 | Disposition: A | Discharge status: Home / Self Care | Payer: Medicaid Other | Attending: Emergency Medicine | Admitting: Emergency Medicine

## 2016-02-14 DIAGNOSIS — Z87891 Personal history of nicotine dependence: Secondary | ICD-10-CM | POA: Diagnosis not present

## 2016-02-14 DIAGNOSIS — Z79899 Other long term (current) drug therapy: Secondary | ICD-10-CM | POA: Diagnosis not present

## 2016-02-14 DIAGNOSIS — Z760 Encounter for issue of repeat prescription: Secondary | ICD-10-CM | POA: Diagnosis present

## 2016-02-14 DIAGNOSIS — Z85118 Personal history of other malignant neoplasm of bronchus and lung: Secondary | ICD-10-CM | POA: Insufficient documentation

## 2016-02-14 DIAGNOSIS — J449 Chronic obstructive pulmonary disease, unspecified: Secondary | ICD-10-CM | POA: Insufficient documentation

## 2016-02-14 DIAGNOSIS — Z85841 Personal history of malignant neoplasm of brain: Secondary | ICD-10-CM | POA: Insufficient documentation

## 2016-02-14 DIAGNOSIS — I10 Essential (primary) hypertension: Secondary | ICD-10-CM | POA: Diagnosis not present

## 2016-02-14 MED ORDER — OXYCODONE-ACETAMINOPHEN 5-325 MG PO TABS: 1.0000 | ORAL_TABLET | Freq: Four times a day (QID) | ORAL | 0 refills | Status: DC | PRN

## 2016-02-14 MED FILL — OXYCODONE W/APAP 5/325 TAB: 5-325 | 3 days supply | Qty: 30 | Fill #0

## 2016-02-14 NOTE — Discharge Instructions (Signed)
If you need further refills in [place], please establish care with a primary care provider or urgent care for further evaluation and treatment of your chronic pain. Please follow-up at your scheduled appointment on 02/19/2016 in [place]. Please return to the emergency department only if you develop any new or worsening symptoms.

## 2016-02-14 NOTE — ED Notes (Addendum)
Patient was alert, oriented and stable upon discharge. RN went over AVS and patient had no further questions. Pt was advised to not drive on oxycodone by RN and PA.

## 2016-02-14 NOTE — ED Triage Notes (Signed)
Pt states that she was seen here for a broken L ankle and is out of her pain meds. States she will be going back to [place] at the beginning of next week where she lives and can f/u with her Oncologist on 12/27. Alert and oriented. Pt has lung and brain CA.

## 2016-02-14 NOTE — ED Provider Notes (Signed)
[facility] DEPT Provider Note   CSN: [id number] Arrival date & time: 02/14/16  1613  By signing my name below, I, Soijett Blue, attest that this documentation has been prepared under the direction and in the presence of Eliezer Mccoy, PA-C Electronically Signed: Soijett Blue, ED Scribe. 02/14/16. 5:14 PM.  History   Chief Complaint Chief Complaint  Patient presents with  . Medication Refill    HPI Ashley Mayo is a 50 y.o. female with a PMHx of stage IV lung CA, brain CA, who presents to the Emergency Department complaining of medication refill of percocet. Pt notes that she takes percocet due to her hx of stage IV lung and brain CA and recent left ankle fracture. Pt reports that she is from [place] and she is in [place] visiting family and will return to [place] on 02/18/2016. Pt notes that she has an appointment with her oncologist on 02/19/2016. She is having associated symptoms of left ankle pain, left ankle swelling, and gait problem due to pain. She denies fever, chills, color change, wound, and any new symptoms. Pt denies being seen by an orthopedist while in [place].  Per pt chart review: Pt was recently discharged from the hospital on 01/27/2016 due to pneumonia and discharged home with a prescription for 30 percocet. Pt was seen in the ED on 02/02/2016 for left ankle pain. Pt had left ankle xray imaging completed with "Evidence of remote bilateral malleolar avulsion injuries. A calcification adjacent to the distal fibula is less well corticated than the others and could represent an acute on chronic avulsion Injury" results. Pt was prescribed a 30 day supply percocet for her symptoms. Pt was also seen in the ED on 02/07/2016 for left ankle pain and prescribed 15 percocet. Pt was seen on 02/10/2016 for a medication refill and prescribed 30 percocet for her symptoms. Pt has follow up appointments with her oncologist on 02/19/2016 and her oncologist cannot prescribe her medication across  state lines.   The history is provided by the patient. No language interpreter was used.    Past Medical History:  Diagnosis Date  . Anxiety   . Asthma   . Cancer ([facility])    lung cancer  . COPD (chronic obstructive pulmonary disease) ([place])   . Depression   . GERD (gastroesophageal reflux disease)   . Hypertension     Patient Active Problem List   Diagnosis Date Noted  . Perforated ear drum, bilateral 01/25/2016  . Pneumonia 01/23/2016  . Healthcare-associated pneumonia 01/23/2016  . Hypokalemia 01/23/2016  . Anxiety 01/23/2016  . Depression 01/23/2016  . Metastatic lung cancer (metastasis from lung to other site) ([facility]) 01/23/2016  . Brain metastasis ([facility]) 01/23/2016  . Essential hypertension 01/23/2016    Past Surgical History:  Procedure Laterality Date  . ABDOMINAL HYSTERECTOMY    . CESAREAN SECTION    . ORIF right hip fracture      OB History    No data available       Home Medications    Prior to Admission medications   Medication Sig Start Date End Date Taking? Authorizing Provider  acetaminophen (TYLENOL) 500 MG tablet Take 1,000 mg by mouth every 6 (six) hours as needed for mild pain, moderate pain, fever or headache.    Historical Provider, MD  ADDERALL XR 30 MG 24 hr capsule Take 30 mg by mouth daily. 01/08/16   Historical Provider, MD  ADVAIR DISKUS 500-50 MCG/DOSE AEPB Take 1 puff by mouth 2 (two) times daily as needed (for  shortness of breath).  12/20/15   Historical Provider, MD  albuterol (PROVENTIL) (2.5 MG/3ML) 0.083% nebulizer solution Take 2.5 mg by nebulization every 3 (three) hours as needed for wheezing or shortness of breath.    Historical Provider, MD  buPROPion (WELLBUTRIN SR) 200 MG 12 hr tablet Take 1 tablet (200 mg total) by mouth 2 (two) times daily. 02/07/16   Domenic Moras, PA-C  ciprofloxacin-dexamethasone (CIPRODEX) otic suspension Place 4 drops into both ears 2 (two) times daily. 01/27/16   Annita Brod, MD    dextromethorphan-guaiFENesin (MUCINEX DM) 30-600 MG 12hr tablet Take 1 tablet by mouth 2 (two) times daily as needed for cough.    Historical Provider, MD  lidocaine (LIDODERM) 5 % Place 1-2 patches onto the skin daily. 01/13/16   Historical Provider, MD  LORazepam (ATIVAN) 0.5 MG tablet Take 0.5 mg by mouth every 6 (six) hours as needed for anxiety. 12/26/15   Historical Provider, MD  metoprolol succinate (TOPROL-XL) 25 MG 24 hr tablet Take 25 mg by mouth daily.    Historical Provider, MD  montelukast (SINGULAIR) 10 MG tablet Take 10 mg by mouth daily.    Historical Provider, MD  nystatin (MYCOSTATIN) 100000 UNIT/ML suspension Take 5 mLs by mouth 4 (four) times daily as needed (for thrush).  11/01/15   Historical Provider, MD  oxyCODONE-acetaminophen (PERCOCET/ROXICET) 5-325 MG tablet Take 1-2 tablets by mouth every 6 (six) hours as needed for severe pain. 02/14/16   Frederica Kuster, PA-C  pantoprazole (PROTONIX) 40 MG tablet Take 40 mg by mouth daily. 01/06/16   Historical Provider, MD  pembrolizumab (KEYTRUDA) 100 MG/4ML SOLN Inject 2 mg/kg into the vein every 21 ( twenty-one) days.    Historical Provider, MD  PROAIR HFA 108 [phone] Base) MCG/ACT inhaler Take 2 puffs by mouth 2 (two) times daily as needed for wheezing or shortness of breath. 01/10/16   Historical Provider, MD  valACYclovir (VALTREX) 500 MG tablet Take 500 mg by mouth 2 (two) times daily as needed (for cold sores).  11/19/15   Historical Provider, MD    Family History Family History  Problem Relation Age of Onset  . Breast cancer Mother     Social History Social History  Substance Use Topics  . Smoking status: Former Smoker    Packs/day: 0.25    Years: 35.00    Types: Cigarettes  . Smokeless tobacco: Never Used  . Alcohol use No     Allergies   Codeine and Shellfish allergy   Review of Systems Review of Systems  Constitutional: Negative for chills and fever.  Musculoskeletal: Positive for arthralgias (left ankle),  gait problem (due to pain) and joint swelling (left ankle).  Skin: Negative for color change and wound.     Physical Exam Updated Vital Signs BP (!) 147/114 (BP Location: Right Arm)   Pulse 105   Temp 98.1 F (36.7 C) (Oral)   Resp 20   SpO2 97%   Physical Exam  Constitutional: She appears well-developed and well-nourished. No distress.  HENT:  Head: Normocephalic and atraumatic.  Mouth/Throat: Oropharynx is clear and moist. No oropharyngeal exudate.  Eyes: Conjunctivae are normal. Pupils are equal, round, and reactive to light. Right eye exhibits no discharge. Left eye exhibits no discharge. No scleral icterus.  Neck: Normal range of motion. Neck supple. No thyromegaly present.  Cardiovascular: Normal rate, regular rhythm and normal heart sounds.  Exam reveals no gallop and no friction rub.   No murmur heard. Pulmonary/Chest: Effort normal and breath sounds  normal. No stridor. No respiratory distress. She has no wheezes. She has no rales.  Abdominal: Soft. Bowel sounds are normal. She exhibits no distension. There is no tenderness. There is no rebound and no guarding.  Musculoskeletal: She exhibits no edema.       Left ankle: She exhibits swelling. Tenderness. Lateral malleolus tenderness found.  Edema and mild tenderness surrounding the lateral malleolus of the left ankle. No color change. dp pulse intact. Left ankle in cam walker boot  Lymphadenopathy:    She has no cervical adenopathy.  Neurological: She is alert. Coordination normal.  Skin: Skin is warm and dry. No rash noted. She is not diaphoretic. No pallor.  Psychiatric: She has a normal mood and affect.  Nursing note and vitals reviewed.   ED Treatments / Results  DIAGNOSTIC STUDIES: Oxygen Saturation is 97% on RA, nl by my interpretation.    COORDINATION OF CARE: 4:40 PM Discussed treatment plan with pt at bedside which includes refill percocet Rx and pt agreed to plan.   Procedures Procedures (including  critical care time)  Medications Ordered in ED Medications - No data to display   Initial Impression / Assessment and Plan / ED Course  I have reviewed the triage vital signs and the nursing notes.  Clinical Course     Patient is here for medication refill. Patient was stuck in [place] due to being hospitalized for pneumonia while she was visiting her son over Thanksgiving. This caused her to miss her flight home. Patient has been in the process of finding a car to return home to [place]. Patient plans to return to [place] on 12/26. Given patient's end-stage cancer and extenuating circumstances, patient discharged home with prescription for Percocet through 12/26. I told patient that she would need to establish care with a primary care provider or local urgent care for any further refills of this medication. The patient's timeline and her reported narcotic prescriptions were verified through our EHR and [facility]. Patient understands and agrees with plan. Patient discharged in satisfactory condition. I discussed patient case with Dr. Laneta Simmers who guided the patient's management and agrees with plan.  Final Clinical Impressions(s) / ED Diagnoses   Final diagnoses:  Medication refill    New Prescriptions Discharge Medication List as of 02/14/2016  5:08 PM      I personally performed the services described in this documentation, which was scribed in my presence. The recorded information has been reviewed and is accurate.     Frederica Kuster, PA-C 02/14/16 1725    Leo Grosser, MD 02/15/16 [phone]

## 2016-05-12 ENCOUNTER — Encounter (HOSPITAL_COMMUNITY): Payer: Self-pay

## 2016-05-12 ENCOUNTER — Emergency Department (HOSPITAL_COMMUNITY)
Admission: EM | Admit: 2016-05-12 | Discharge: 2016-05-12 | Disposition: A | Discharge status: Home / Self Care | Payer: Medicaid Other | Attending: Emergency Medicine | Admitting: Emergency Medicine

## 2016-05-12 ENCOUNTER — Emergency Department (HOSPITAL_COMMUNITY): Payer: Medicaid Other

## 2016-05-12 DIAGNOSIS — N390 Urinary tract infection, site not specified: Secondary | ICD-10-CM | POA: Insufficient documentation

## 2016-05-12 DIAGNOSIS — Z87891 Personal history of nicotine dependence: Secondary | ICD-10-CM | POA: Insufficient documentation

## 2016-05-12 DIAGNOSIS — Z85118 Personal history of other malignant neoplasm of bronchus and lung: Secondary | ICD-10-CM | POA: Insufficient documentation

## 2016-05-12 DIAGNOSIS — I10 Essential (primary) hypertension: Secondary | ICD-10-CM | POA: Insufficient documentation

## 2016-05-12 DIAGNOSIS — J449 Chronic obstructive pulmonary disease, unspecified: Secondary | ICD-10-CM | POA: Insufficient documentation

## 2016-05-12 HISTORY — DX: Migraine, unspecified, not intractable, without status migrainosus: G43.909

## 2016-05-12 LAB — CBC WITH DIFFERENTIAL/PLATELET
Basophils Absolute: 0 10*3/uL (ref 0.0–0.1)
Basophils Relative: 0 %
Eosinophils Absolute: 0.1 10*3/uL (ref 0.0–0.7)
Eosinophils Relative: 1 %
HCT: 38.8 % (ref 36.0–46.0)
Hemoglobin: 13 g/dL (ref 12.0–15.0)
Lymphocytes Relative: 30 %
Lymphs Abs: 3.3 10*3/uL (ref 0.7–4.0)
MCH: 29.9 pg (ref 26.0–34.0)
MCHC: 33.5 g/dL (ref 30.0–36.0)
MCV: 89.2 fL (ref 78.0–100.0)
Monocytes Absolute: 0.6 10*3/uL (ref 0.1–1.0)
Monocytes Relative: 6 %
Neutro Abs: 6.9 10*3/uL (ref 1.7–7.7)
Neutrophils Relative %: 63 %
Platelets: 334 10*3/uL (ref 150–400)
RBC: 4.35 MIL/uL (ref 3.87–5.11)
RDW: 13.7 % (ref 11.5–15.5)
WBC: 11 10*3/uL — ABNORMAL HIGH (ref 4.0–10.5)

## 2016-05-12 LAB — URINALYSIS, ROUTINE W REFLEX MICROSCOPIC
Bilirubin Urine: NEGATIVE
Glucose, UA: NEGATIVE mg/dL
Ketones, ur: NEGATIVE mg/dL
Nitrite: NEGATIVE
Protein, ur: 100 mg/dL — AB
Specific Gravity, Urine: 1.019 (ref 1.005–1.030)
pH: 6 (ref 5.0–8.0)

## 2016-05-12 LAB — COMPREHENSIVE METABOLIC PANEL
ALT: 13 U/L — ABNORMAL LOW (ref 14–54)
AST: 19 U/L (ref 15–41)
Albumin: 4.1 g/dL (ref 3.5–5.0)
Alkaline Phosphatase: 83 U/L (ref 38–126)
Anion gap: 8 (ref 5–15)
BUN: 10 mg/dL (ref 6–20)
CO2: 25 mmol/L (ref 22–32)
Calcium: 9.3 mg/dL (ref 8.9–10.3)
Chloride: 107 mmol/L (ref 101–111)
Creatinine, Ser: 0.78 mg/dL (ref 0.44–1.00)
GFR calc Af Amer: >60 mL/min (ref >60)
GFR calc non Af Amer: >60 mL/min (ref >60)
Glucose, Bld: 105 mg/dL — ABNORMAL HIGH (ref 65–99)
Potassium: 3.2 mmol/L — ABNORMAL LOW (ref 3.5–5.1)
Sodium: 140 mmol/L (ref 135–145)
Total Bilirubin: 0.4 mg/dL (ref 0.3–1.2)
Total Protein: 7.1 g/dL (ref 6.5–8.1)

## 2016-05-12 IMAGING — CR DG CHEST 2V
2 series · 2 of 2 positions shown · non-contrast
Comparison: [DATE] .

CLINICAL DATA: Pain.  Difficulty urinating.

EXAM:
CHEST  2 VIEW

[w chest pa]
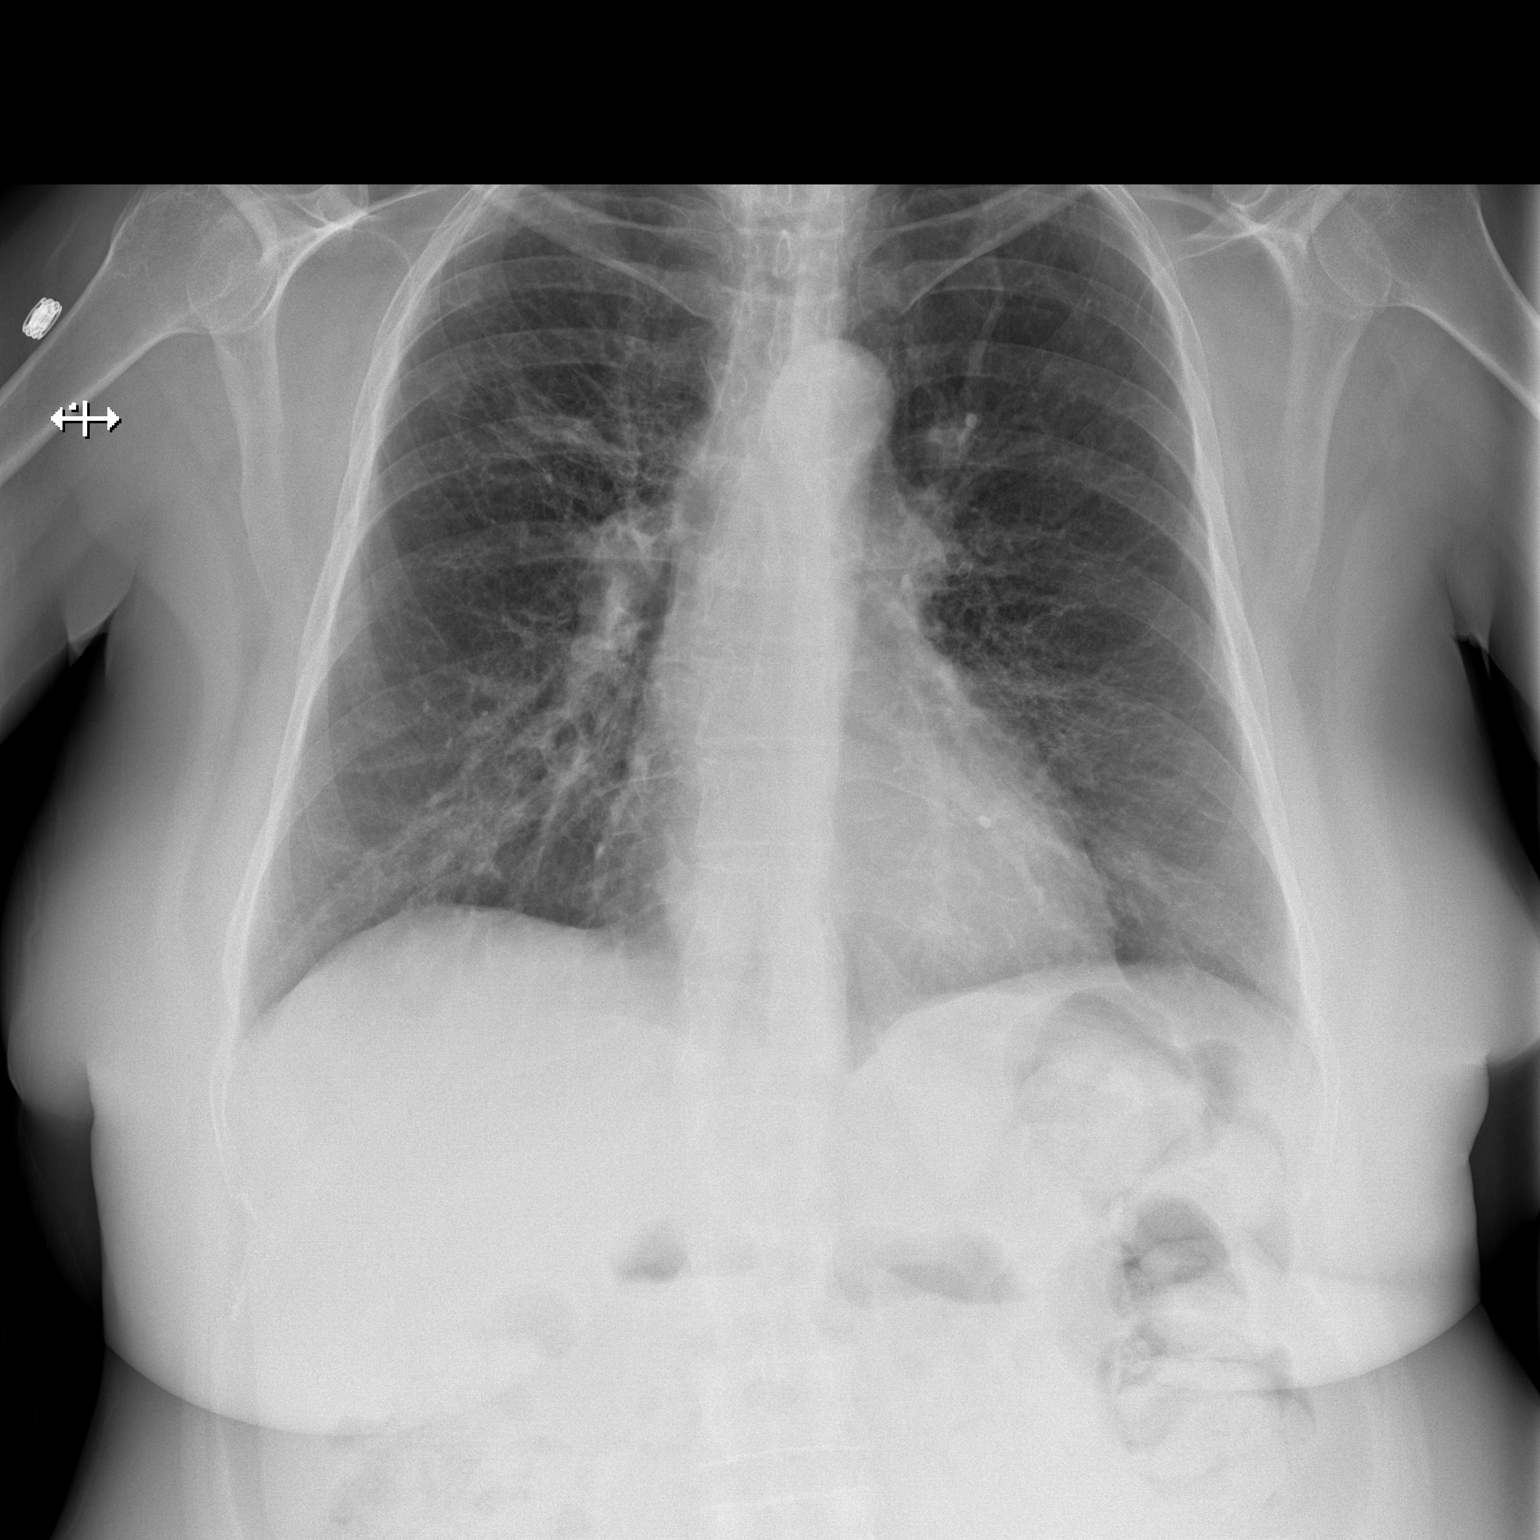

[w chest lat]
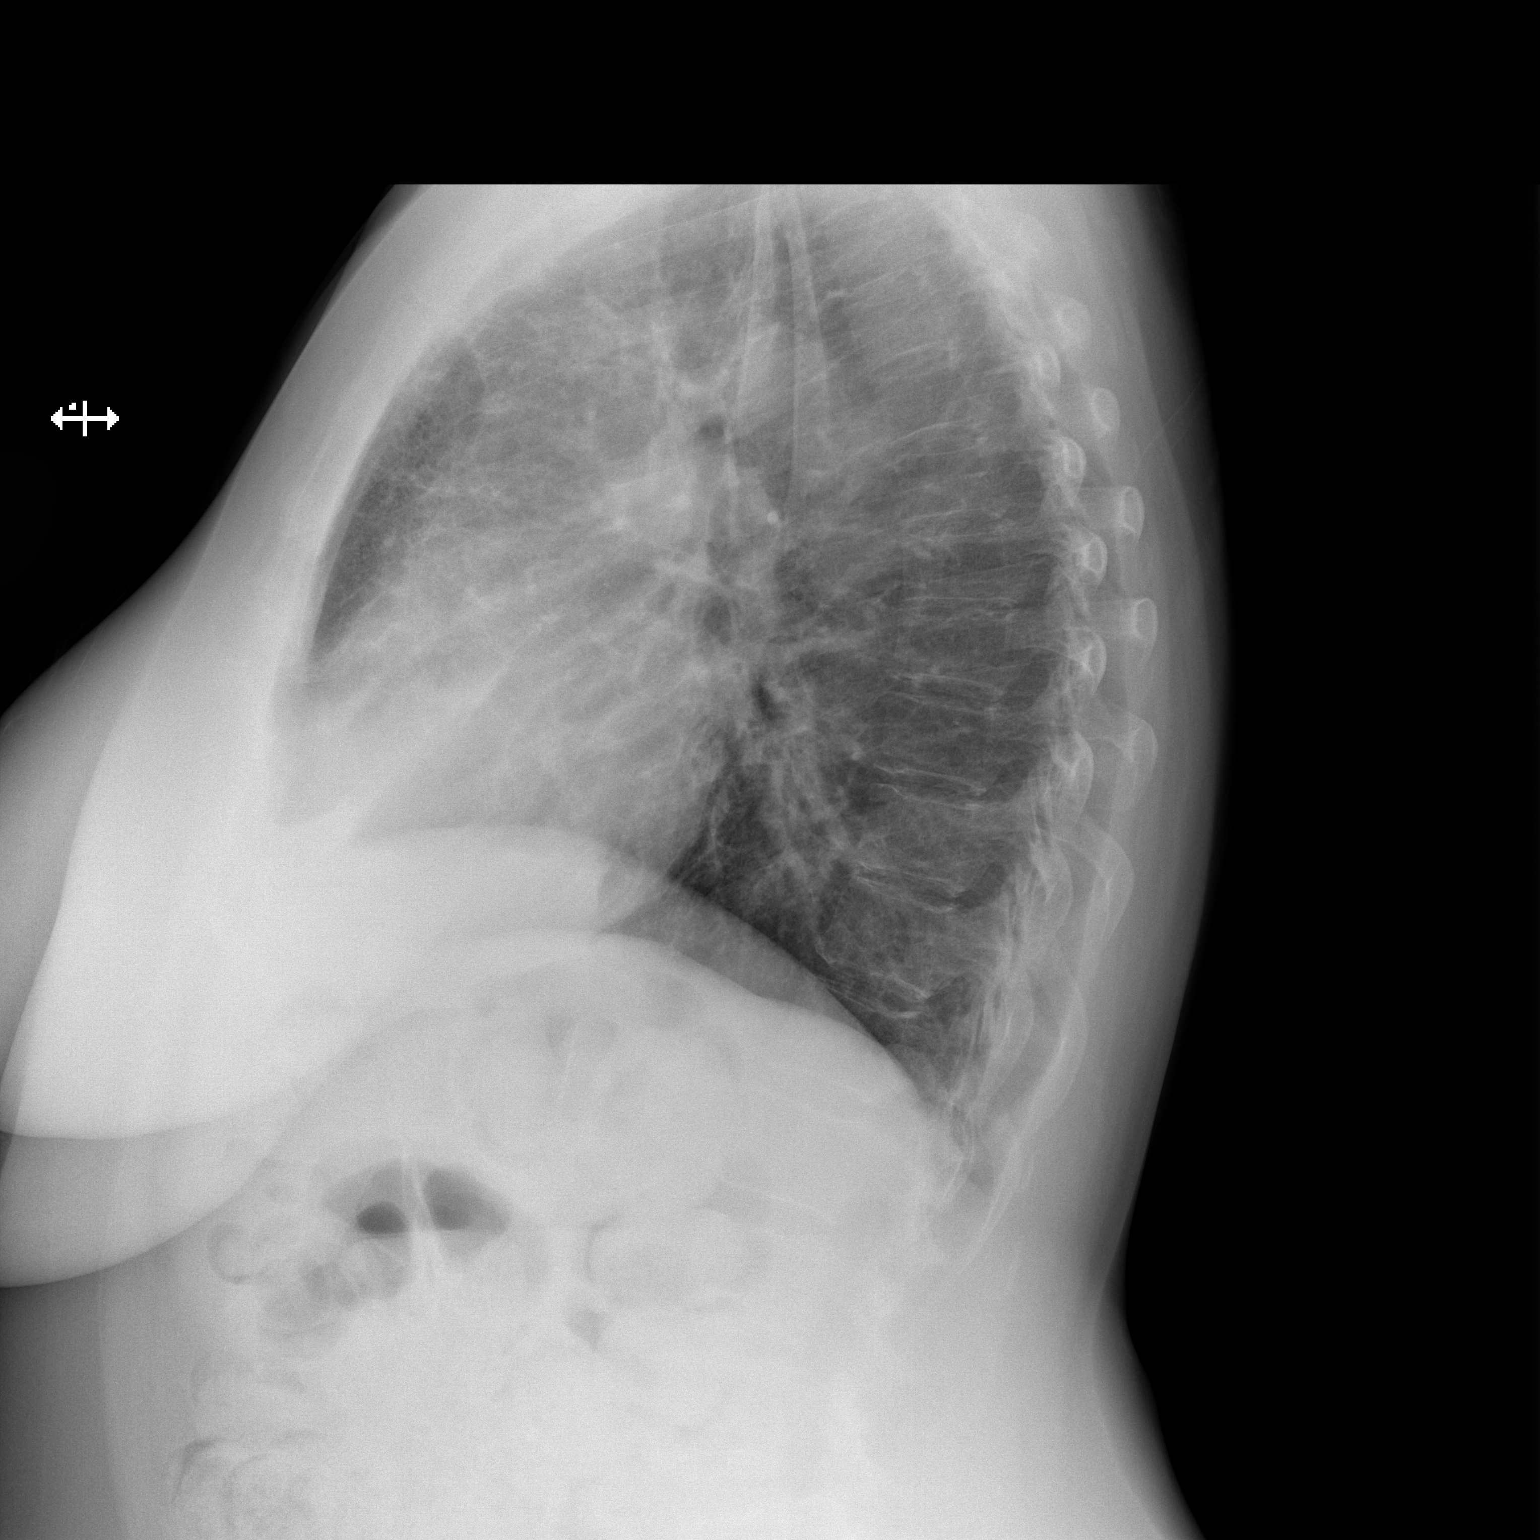

[2 of 2 positions shown; findings below may reference images not displayed]

FINDINGS: Mediastinum and hilar structures are normal. Mild cardiomegaly. No
pulmonary venous congestion. Mild chronic interstitial changes noted
bilaterally. Changes consistent with bilateral areas of subsegmental
atelectasis and pleuroparenchymal scarring again noted. Mild
bibasilar pneumonia cannot be excluded.
IMPRESSION: 1. Mild bibasilar pneumonia cannot be excluded.

2. Chronic interstitial lung disease with bilateral areas of
subsegmental atelectasis and pleuroparenchymal scarring again noted.

3.  Mild cardiomegaly.  No pulmonary venous congestion .

## 2016-05-12 MED ORDER — LEVOFLOXACIN 750 MG PO TABS
750.0000 mg | ORAL_TABLET | Freq: Every day | ORAL | 0 refills | Status: DC
Start: 2016-05-12 — End: 2017-12-30

## 2016-05-12 MED ORDER — SUMATRIPTAN SUCCINATE 6 MG/0.5ML ~~LOC~~ SOLN
6.0000 mg | Freq: Once | SUBCUTANEOUS | Status: AC
  Administered 2016-05-12: 6 mg via SUBCUTANEOUS
  Filled 2016-05-12: qty 0.5

## 2016-05-12 MED ORDER — ALBUTEROL SULFATE (2.5 MG/3ML) 0.083% IN NEBU
5.0000 mg | INHALATION_SOLUTION | Freq: Once | RESPIRATORY_TRACT | Status: AC
  Administered 2016-05-12: 5 mg via RESPIRATORY_TRACT
  Filled 2016-05-12: qty 6

## 2016-05-12 MED ORDER — SUMATRIPTAN SUCCINATE 6 MG/0.5ML ~~LOC~~ SOLN: 6.0000 mg | SUBCUTANEOUS | 0 refills | Status: DC | PRN

## 2016-05-12 MED ORDER — PHENAZOPYRIDINE HCL 200 MG PO TABS
200.0000 mg | ORAL_TABLET | Freq: Once | ORAL | Status: AC
  Administered 2016-05-12: 200 mg via ORAL
  Filled 2016-05-12: qty 1

## 2016-05-12 MED ORDER — ALBUTEROL SULFATE HFA 108 (90 BASE) MCG/ACT IN AERS
2.0000 | INHALATION_SPRAY | RESPIRATORY_TRACT | Status: DC | PRN
  Administered 2016-05-12: 2 via RESPIRATORY_TRACT
  Filled 2016-05-12: qty 6.7

## 2016-05-12 MED ORDER — PHENAZOPYRIDINE HCL 200 MG PO TABS: 200.0000 mg | ORAL_TABLET | Freq: Three times a day (TID) | ORAL | 0 refills | Status: DC

## 2016-05-12 MED ORDER — LEVOFLOXACIN 750 MG PO TABS
750.0000 mg | ORAL_TABLET | Freq: Once | ORAL | Status: AC
  Administered 2016-05-12: 750 mg via ORAL
  Filled 2016-05-12: qty 1

## 2016-05-12 MED FILL — SUMATRIPTAN 6 MG/0.5 ML VIA: 6 | 7 days supply | Qty: 3 | Fill #0

## 2016-05-12 MED FILL — levoFLOXacin 750 MG TABS: 750 | 7 days supply | Qty: 7 | Fill #0

## 2016-05-12 NOTE — ED Provider Notes (Signed)
[facility] DEPT Provider Note   CSN: [id number] Arrival date & time: 05/12/16  0803     History   Chief Complaint Chief Complaint  Patient presents with  . Dysuria    HPI Ashley Mayo is a 51 y.o. female.  Patient is a 51 year old female with multiple medical problems including lung cancer with metastasis to the brain currently on immunotherapy, asthma, COPD and hypertension presenting today with 4 day history of dysuria, frequency, urgency and now suprapubic pain that is worsening. It is sharp pressure type pain that has not been relieved by her typical Percocet. She has felt some fever and chills max temperature 99.2. One episode of vomiting yesterday but denies any back pain. No diarrhea. Last urinary tract infection was approximately 2 months ago and cleared quickly with antibiotics. Patient has had no recent medication changes and has been on immunotherapy for approximately 5 months. Secondly patient is complaining of some mild shortness of breath which is worse with activity. She typically lives in [place] but is here visiting her grandchildren. It did seem to have started when she came down here with all the additional pollens. When she uses her inhaler the shortness of breath improves. However she ran out of her albuterol today and is requesting a refill. She has had mild coughing but denies any change from baseline. No leg swelling or unilateral calf pain. She denies any chest pain.   The history is provided by the patient.    Past Medical History:  Diagnosis Date  . Anxiety   . Asthma   . Cancer ([place])    lung cancer  . COPD (chronic obstructive pulmonary disease) ([place])   . Depression   . GERD (gastroesophageal reflux disease)   . Hypertension   . Migraine     Patient Active Problem List   Diagnosis Date Noted  . Perforated ear drum, bilateral 01/25/2016  . Pneumonia 01/23/2016  . Healthcare-associated pneumonia 01/23/2016  . Hypokalemia 01/23/2016  .  Anxiety 01/23/2016  . Depression 01/23/2016  . Metastatic lung cancer (metastasis from lung to other site) ([place]) 01/23/2016  . Brain metastasis ([place]) 01/23/2016  . Essential hypertension 01/23/2016    Past Surgical History:  Procedure Laterality Date  . ABDOMINAL HYSTERECTOMY    . CESAREAN SECTION    . ORIF right hip fracture      OB History    No data available       Home Medications    Prior to Admission medications   Medication Sig Start Date End Date Taking? Authorizing Provider  acetaminophen (TYLENOL) 500 MG tablet Take 1,000 mg by mouth every 6 (six) hours as needed for mild pain, moderate pain, fever or headache.    Historical Provider, MD  ADDERALL XR 30 MG 24 hr capsule Take 30 mg by mouth daily. 01/08/16   Historical Provider, MD  ADVAIR DISKUS 500-50 MCG/DOSE AEPB Take 1 puff by mouth 2 (two) times daily as needed (for shortness of breath).  12/20/15   Historical Provider, MD  albuterol (PROVENTIL) (2.5 MG/3ML) 0.083% nebulizer solution Take 2.5 mg by nebulization every 3 (three) hours as needed for wheezing or shortness of breath.    Historical Provider, MD  buPROPion (WELLBUTRIN SR) 200 MG 12 hr tablet Take 1 tablet (200 mg total) by mouth 2 (two) times daily. 02/07/16   Domenic Moras, PA-C  ciprofloxacin-dexamethasone (CIPRODEX) otic suspension Place 4 drops into both ears 2 (two) times daily. 01/27/16   Annita Brod, MD  dextromethorphan-guaiFENesin [facility] DM) 30-600  MG 12hr tablet Take 1 tablet by mouth 2 (two) times daily as needed for cough.    Historical Provider, MD  lidocaine (LIDODERM) 5 % Place 1-2 patches onto the skin daily. 01/13/16   Historical Provider, MD  LORazepam (ATIVAN) 0.5 MG tablet Take 0.5 mg by mouth every 6 (six) hours as needed for anxiety. 12/26/15   Historical Provider, MD  metoprolol succinate (TOPROL-XL) 25 MG 24 hr tablet Take 25 mg by mouth daily.    Historical Provider, MD  montelukast (SINGULAIR) 10 MG tablet Take 10 mg by mouth  daily.    Historical Provider, MD  nystatin (MYCOSTATIN) 100000 UNIT/ML suspension Take 5 mLs by mouth 4 (four) times daily as needed (for thrush).  11/01/15   Historical Provider, MD  oxyCODONE-acetaminophen (PERCOCET/ROXICET) 5-325 MG tablet Take 1-2 tablets by mouth every 6 (six) hours as needed for severe pain. 02/14/16   Frederica Kuster, PA-C  pantoprazole (PROTONIX) 40 MG tablet Take 40 mg by mouth daily. 01/06/16   Historical Provider, MD  pembrolizumab (KEYTRUDA) 100 MG/4ML SOLN Inject 2 mg/kg into the vein every 21 ( twenty-one) days.    Historical Provider, MD  PROAIR HFA 108 [phone] Base) MCG/ACT inhaler Take 2 puffs by mouth 2 (two) times daily as needed for wheezing or shortness of breath. 01/10/16   Historical Provider, MD  valACYclovir (VALTREX) 500 MG tablet Take 500 mg by mouth 2 (two) times daily as needed (for cold sores).  11/19/15   Historical Provider, MD    Family History Family History  Problem Relation Age of Onset  . Breast cancer Mother     Social History Social History  Substance Use Topics  . Smoking status: Former Smoker    Packs/day: 0.25    Years: 35.00    Types: Cigarettes  . Smokeless tobacco: Never Used  . Alcohol use No     Allergies   Codeine and Shellfish allergy   Review of Systems Review of Systems  Neurological:       Hx of migraines and is starting to feel a migraine coming on like her normal headaches and is requesting imitrex  All other systems reviewed and are negative.    Physical Exam Updated Vital Signs BP (!) 137/94 (BP Location: Left Arm)   Pulse 85   Temp 97.4 F (36.3 C) (Oral)   Resp 16   Ht '5\' 4"'$  (1.626 m)   Wt 174 lb (78.9 kg)   SpO2 100%   BMI 29.87 kg/m   Physical Exam  Constitutional: She is oriented to person, place, and time. She appears well-developed and well-nourished. No distress.  HENT:  Head: Normocephalic and atraumatic.  Mouth/Throat: Oropharynx is clear and moist.  Eyes: Conjunctivae and EOM are  normal. Pupils are equal, round, and reactive to light.  Neck: Normal range of motion. Neck supple.  Cardiovascular: Normal rate, regular rhythm and intact distal pulses.   No murmur heard. Pulmonary/Chest: Effort normal. No respiratory distress. She has wheezes. She has no rales.  Abdominal: Soft. She exhibits no distension. There is tenderness in the suprapubic area. There is no rebound, no guarding and no CVA tenderness.  Musculoskeletal: Normal range of motion. She exhibits no edema or tenderness.  Neurological: She is alert and oriented to person, place, and time.  Skin: Skin is warm and dry. Rash noted. No erythema.  Psoriasis present over the before meals joints of bilateral upper extremities  Psychiatric: She has a normal mood and affect. Her behavior is normal.  Nursing note and vitals reviewed.    ED Treatments / Results  Labs (all labs ordered are listed, but only abnormal results are displayed) Labs Reviewed  CBC WITH DIFFERENTIAL/PLATELET - Abnormal; Notable for the following:       Result Value   WBC 11.0 (*)    All other components within normal limits  URINALYSIS, ROUTINE W REFLEX MICROSCOPIC - Abnormal; Notable for the following:    APPearance TURBID (*)    Hgb urine dipstick MODERATE (*)    Protein, ur 100 (*)    Leukocytes, UA LARGE (*)    Bacteria, UA RARE (*)    Squamous Epithelial / LPF 0-5 (*)    All other components within normal limits  COMPREHENSIVE METABOLIC PANEL - Abnormal; Notable for the following:    Potassium 3.2 (*)    Glucose, Bld 105 (*)    ALT 13 (*)    All other components within normal limits  URINE CULTURE  COMPREHENSIVE METABOLIC PANEL    EKG  EKG Interpretation None       Radiology Dg Chest 2 View  Result Date: 05/12/2016 CLINICAL DATA:  Pain.  Difficulty urinating. EXAM: CHEST  2 VIEW COMPARISON:  01/23/2016 . FINDINGS: Mediastinum and hilar structures are normal. Mild cardiomegaly. No pulmonary venous congestion. Mild  chronic interstitial changes noted bilaterally. Changes consistent with bilateral areas of subsegmental atelectasis and pleuroparenchymal scarring again noted. Mild bibasilar pneumonia cannot be excluded. IMPRESSION: 1. Mild bibasilar pneumonia cannot be excluded. 2. Chronic interstitial lung disease with bilateral areas of subsegmental atelectasis and pleuroparenchymal scarring again noted. 3.  Mild cardiomegaly.  No pulmonary venous congestion . Electronically Signed   By: Marcello Moores  Register   On: 05/12/2016 08:45    Procedures Procedures (including critical care time)  Medications Ordered in ED Medications  albuterol (PROVENTIL HFA;VENTOLIN HFA) 108 (90 Base) MCG/ACT inhaler 2 puff (not administered)  albuterol (PROVENTIL) (2.5 MG/3ML) 0.083% nebulizer solution 5 mg (5 mg Nebulization Given 05/12/16 0918)  phenazopyridine (PYRIDIUM) tablet 200 mg (200 mg Oral Given 05/12/16 0927)  SUMAtriptan (IMITREX) injection 6 mg (6 mg Subcutaneous Given 05/12/16 0927)  levofloxacin (LEVAQUIN) tablet 750 mg (750 mg Oral Given 05/12/16 1049)     Initial Impression / Assessment and Plan / ED Course  I have reviewed the triage vital signs and the nursing notes.  Pertinent labs & imaging results that were available during my care of the patient were reviewed by me and considered in my medical decision making (see chart for details).     Patient presenting today with symptoms classic for urinary tract infection. Patient does have a complicated medical history with metastatic lung cancer and on immunotherapy however she is not displaying symptoms consistent with pyelonephritis. Vital signs are within normal limits. Blood pressure within normal limits. UA is consistent with the UTI. Urine cultured and will treat with Keflex and hydrate pyridium. Secondly patient is having some increased shortness of breath from baseline since she arrived here from [place]. She states pollens usually bother her and she's been  more active here with her grandchildren. Shortness of breath is usually improved when she uses her inhaler and rest. However she has run out of her inhaler. She has mild wheezing on exam but is well appearing and has no retractions or signs of respiratory distress. O2 sat is 100%. She denies chest pain, leg swelling, any symptoms suggestive of PE or CHF.  CXR done states can't r/o bibasilar PNA however feel this is less likely as pt  does not have chest pain, new productive cough and sx resolved with albuterol but will cover UTI with levaquin so respiratory process would be covered as well. Labs re-assuring.  Pt continues to look well.  Will d/c home and she has f/u with PCP in [place] on friday Final Clinical Impressions(s) / ED Diagnoses   Final diagnoses:  Lower urinary tract infectious disease    New Prescriptions New Prescriptions   LEVOFLOXACIN (LEVAQUIN) 750 MG TABLET    Take 1 tablet (750 mg total) by mouth daily. Take for 7 days   PHENAZOPYRIDINE (PYRIDIUM) 200 MG TABLET    Take 1 tablet (200 mg total) by mouth 3 (three) times daily.   SUMATRIPTAN (IMITREX) 6 MG/0.5ML SOLN INJECTION    Inject 0.5 mLs (6 mg total) into the skin every 2 (two) hours as needed for migraine or headache. May repeat in 2 hours if headache persists or recurs.     Blanchie Dessert, MD 05/12/16 1157

## 2016-05-12 NOTE — ED Notes (Signed)
Called lab to check on progress of CMP. Is currently being spun.

## 2016-05-12 NOTE — ED Notes (Signed)
Sig pad not working. Pt attempted to sign for d/c instructions and prescriptions. Verbalized understanding.

## 2016-05-12 NOTE — ED Triage Notes (Addendum)
Patient reports that she has been having dysuria, urinary frequency x 4 days. Patient also reports an intermittent low grade temperature and has taking Percocet.

## 2016-05-14 LAB — URINE CULTURE: Culture: 80000 — AB

## 2016-05-15 ENCOUNTER — Telehealth: Payer: Self-pay | Admitting: *Deleted

## 2016-05-15 NOTE — Telephone Encounter (Signed)
Post ED Visit - Positive Culture Follow-up  Culture report reviewed by antimicrobial stewardship pharmacist:  '[]'$  Elenor Quinones, Pharm.D. '[]'$  Heide Guile, Pharm.D., BCPS AQ-ID '[]'$  Parks Neptune, Pharm.D., BCPS '[]'$  Alycia Rossetti, Pharm.D., BCPS '[]'$  [place], Pharm.D., BCPS, AAHIVP '[]'$  Legrand Como, Pharm.D., BCPS, AAHIVP '[]'$  Salome Arnt, PharmD, BCPS '[]'$  Dimitri Ped, PharmD, BCPS '[x]'$  Vincenza Hews, PharmD, BCPS  Positive urine culture No further patient follow-up is required at this time.  Harlon Flor [facility] 05/15/2016, 9:01 AM

## 2017-01-24 ENCOUNTER — Encounter (HOSPITAL_COMMUNITY): Payer: Self-pay | Admitting: Emergency Medicine

## 2017-01-24 ENCOUNTER — Emergency Department (HOSPITAL_COMMUNITY): Payer: Medicaid Other

## 2017-01-24 ENCOUNTER — Emergency Department (HOSPITAL_COMMUNITY)
Admission: EM | Admit: 2017-01-24 | Discharge: 2017-01-24 | Disposition: A | Discharge status: Home / Self Care | Payer: Medicaid Other | Attending: Emergency Medicine | Admitting: Emergency Medicine

## 2017-01-24 DIAGNOSIS — J449 Chronic obstructive pulmonary disease, unspecified: Secondary | ICD-10-CM | POA: Insufficient documentation

## 2017-01-24 DIAGNOSIS — Z85118 Personal history of other malignant neoplasm of bronchus and lung: Secondary | ICD-10-CM | POA: Insufficient documentation

## 2017-01-24 DIAGNOSIS — Z87891 Personal history of nicotine dependence: Secondary | ICD-10-CM | POA: Insufficient documentation

## 2017-01-24 DIAGNOSIS — M25562 Pain in left knee: Secondary | ICD-10-CM

## 2017-01-24 DIAGNOSIS — I10 Essential (primary) hypertension: Secondary | ICD-10-CM | POA: Insufficient documentation

## 2017-01-24 DIAGNOSIS — Y9301 Activity, walking, marching and hiking: Secondary | ICD-10-CM | POA: Insufficient documentation

## 2017-01-24 DIAGNOSIS — W0110XA Fall on same level from slipping, tripping and stumbling with subsequent striking against unspecified object, initial encounter: Secondary | ICD-10-CM | POA: Insufficient documentation

## 2017-01-24 DIAGNOSIS — Y999 Unspecified external cause status: Secondary | ICD-10-CM | POA: Insufficient documentation

## 2017-01-24 DIAGNOSIS — C7931 Secondary malignant neoplasm of brain: Secondary | ICD-10-CM | POA: Insufficient documentation

## 2017-01-24 DIAGNOSIS — Y929 Unspecified place or not applicable: Secondary | ICD-10-CM | POA: Insufficient documentation

## 2017-01-24 DIAGNOSIS — Z79899 Other long term (current) drug therapy: Secondary | ICD-10-CM | POA: Insufficient documentation

## 2017-01-24 DIAGNOSIS — S93401A Sprain of unspecified ligament of right ankle, initial encounter: Secondary | ICD-10-CM | POA: Insufficient documentation

## 2017-01-24 DIAGNOSIS — S50312A Abrasion of left elbow, initial encounter: Secondary | ICD-10-CM

## 2017-01-24 DIAGNOSIS — J45909 Unspecified asthma, uncomplicated: Secondary | ICD-10-CM | POA: Insufficient documentation

## 2017-01-24 IMAGING — CR DG HIP (WITH OR WITHOUT PELVIS) 2-3V*R*
3 series · 3 of 3 positions shown · non-contrast
Comparison: None.

CLINICAL DATA: Fall yesterday.  Right hip pain.  Initial encounter.

EXAM:
DG HIP (WITH OR WITHOUT PELVIS) 2-3V RIGHT

[t pelvis ap]
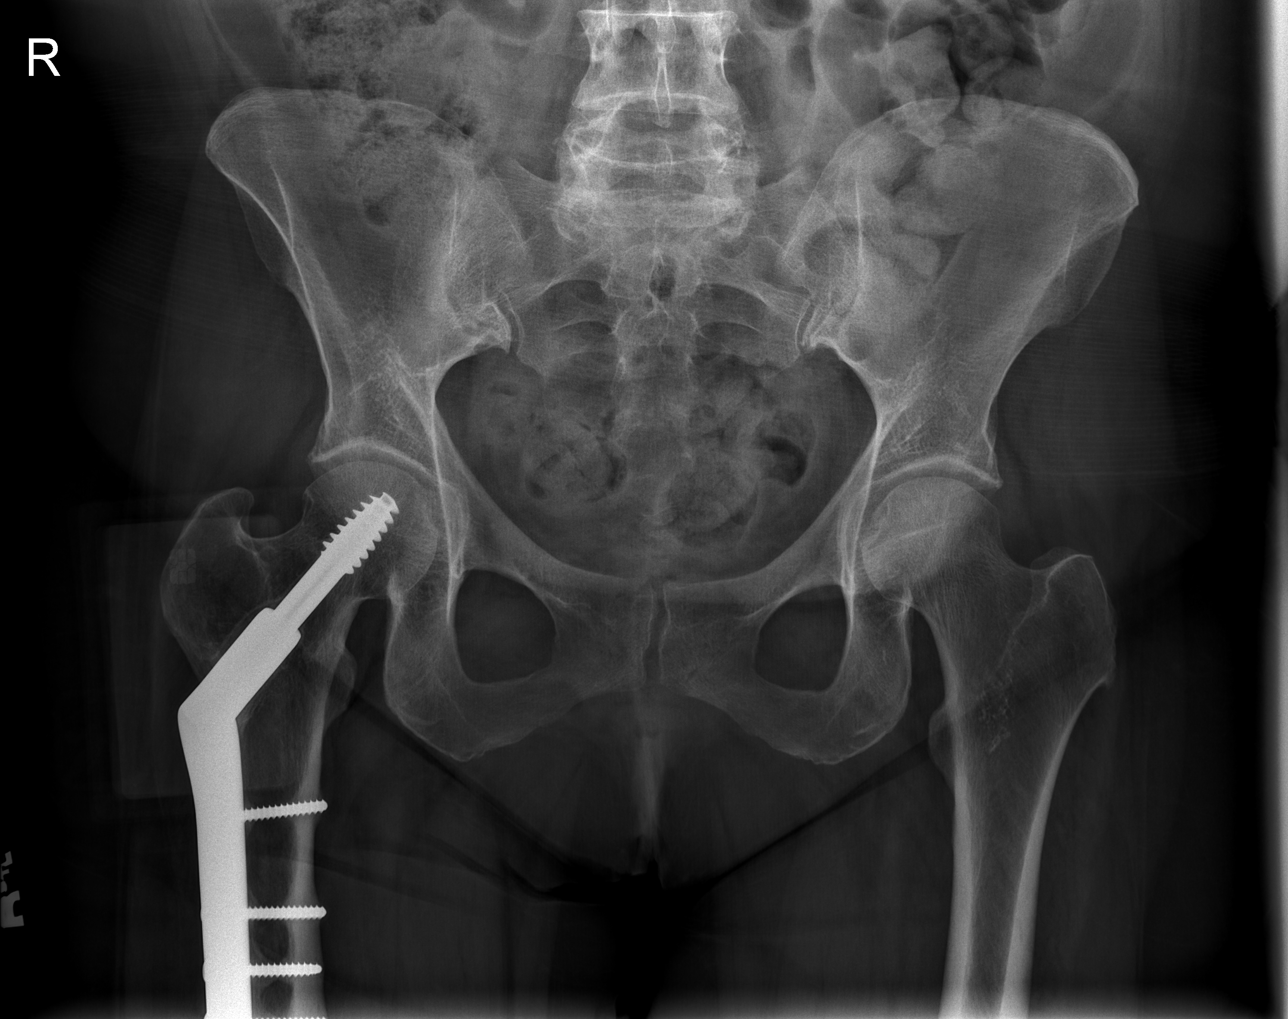

[t hip ap right]
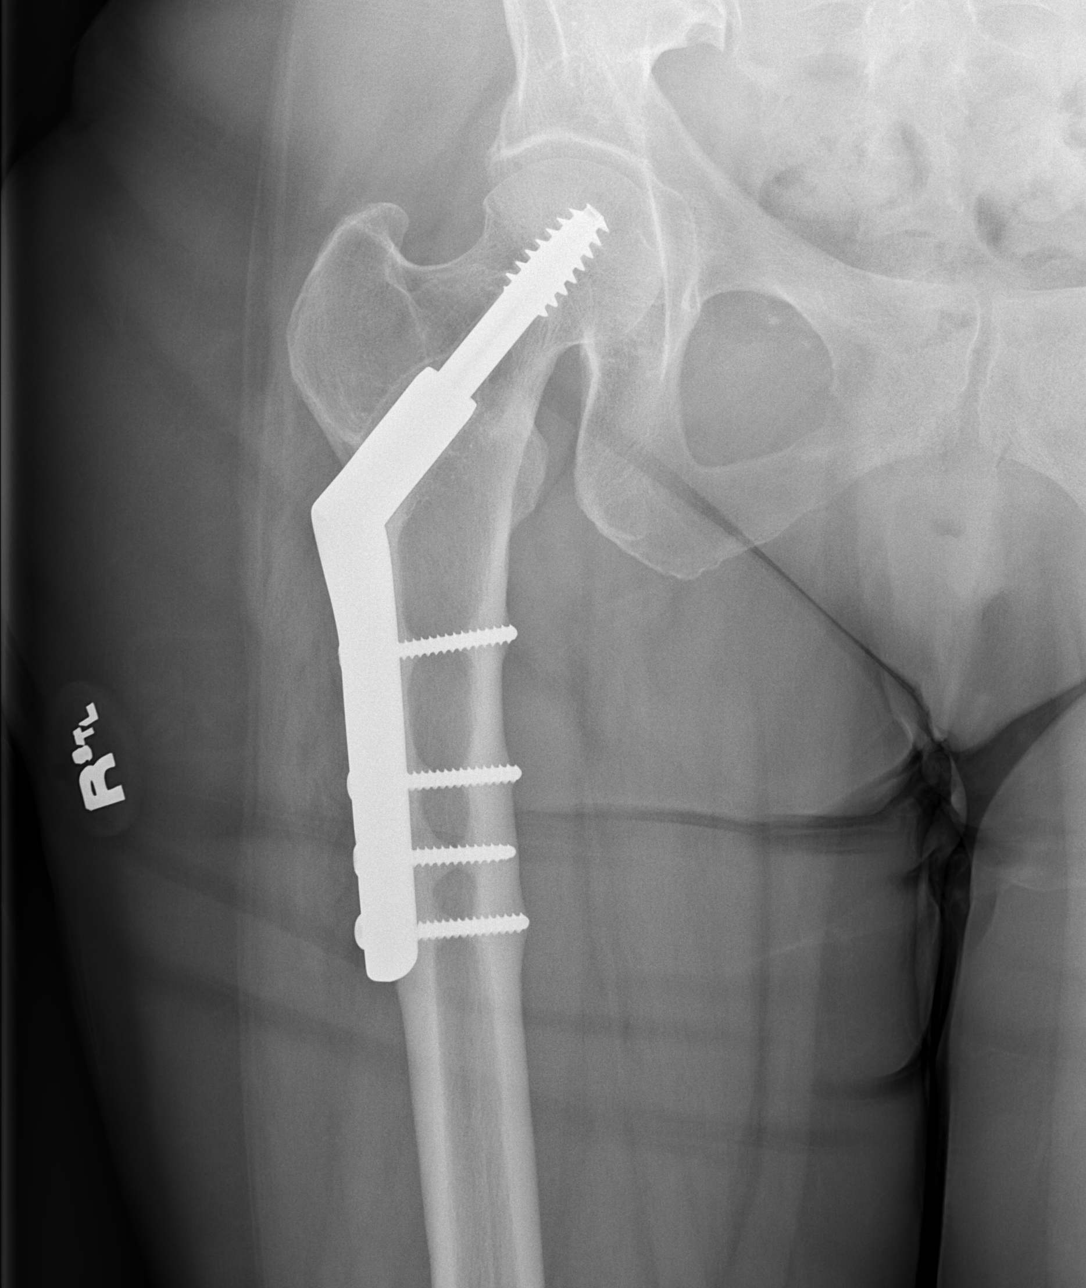

[t hip frog leg right]
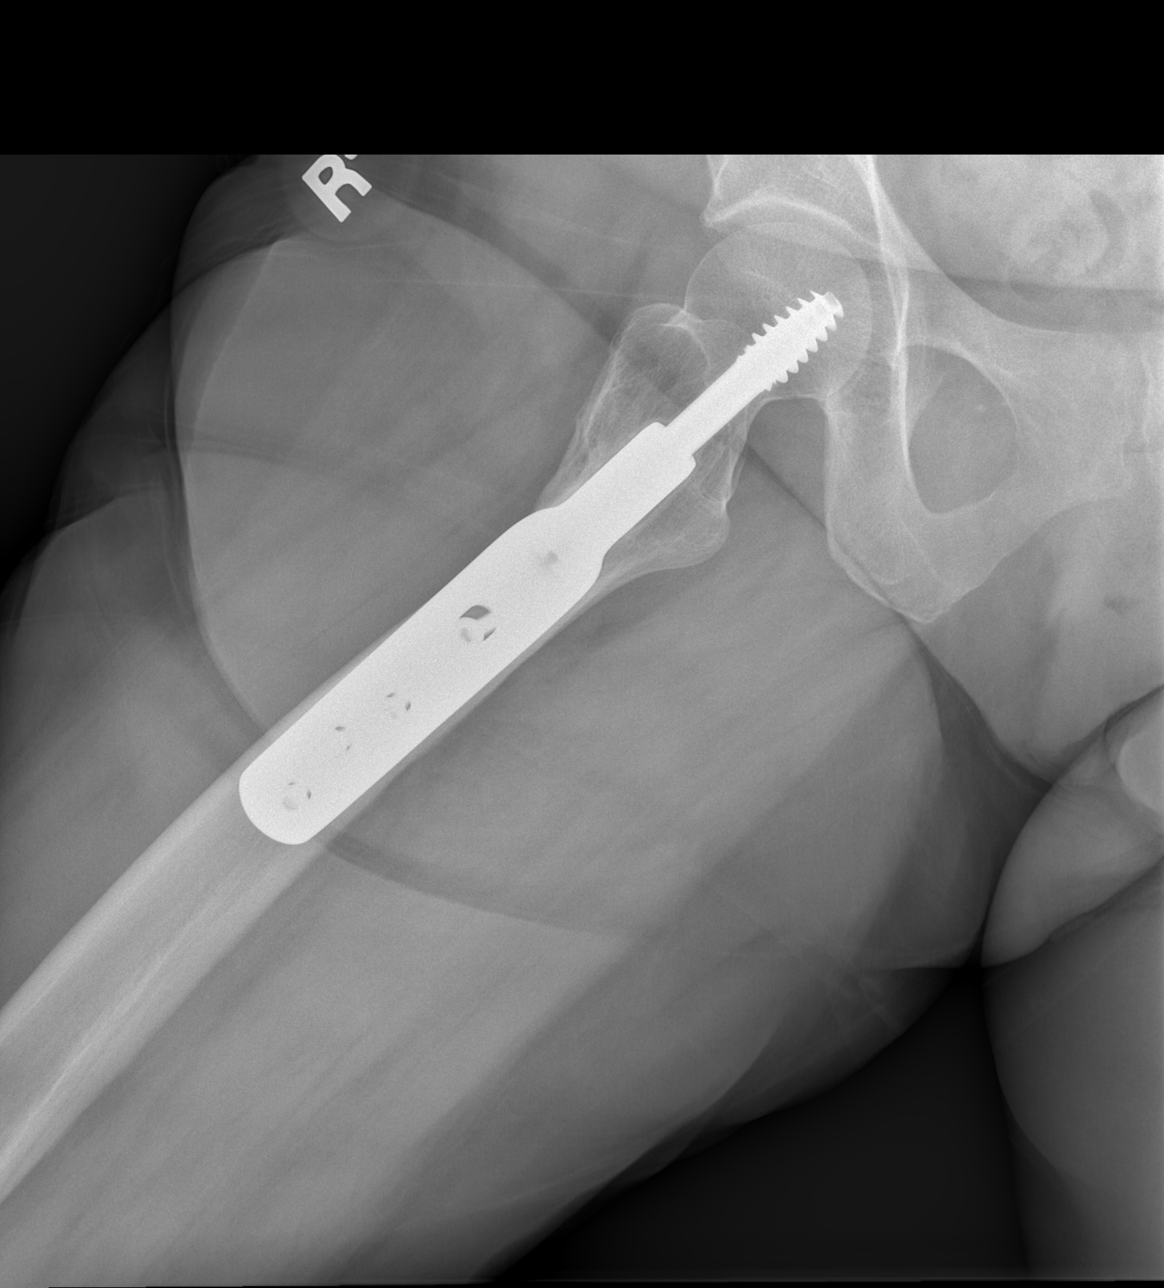

[3 of 3 positions shown; findings below may reference images not displayed]

FINDINGS: Sliding screw -plate fixation device is seen in the left hip. No
evidence of acute fracture or dislocation. No other significant bone
abnormality identified.
IMPRESSION: No acute findings.

## 2017-01-24 IMAGING — CR DG KNEE COMPLETE 4+V*L*
4 series · 4 of 4 positions shown · non-contrast
Comparison: None.

CLINICAL DATA: Fall yesterday.  Pain.

EXAM:
LEFT KNEE - COMPLETE 4+ VIEW

[t knee ap left]
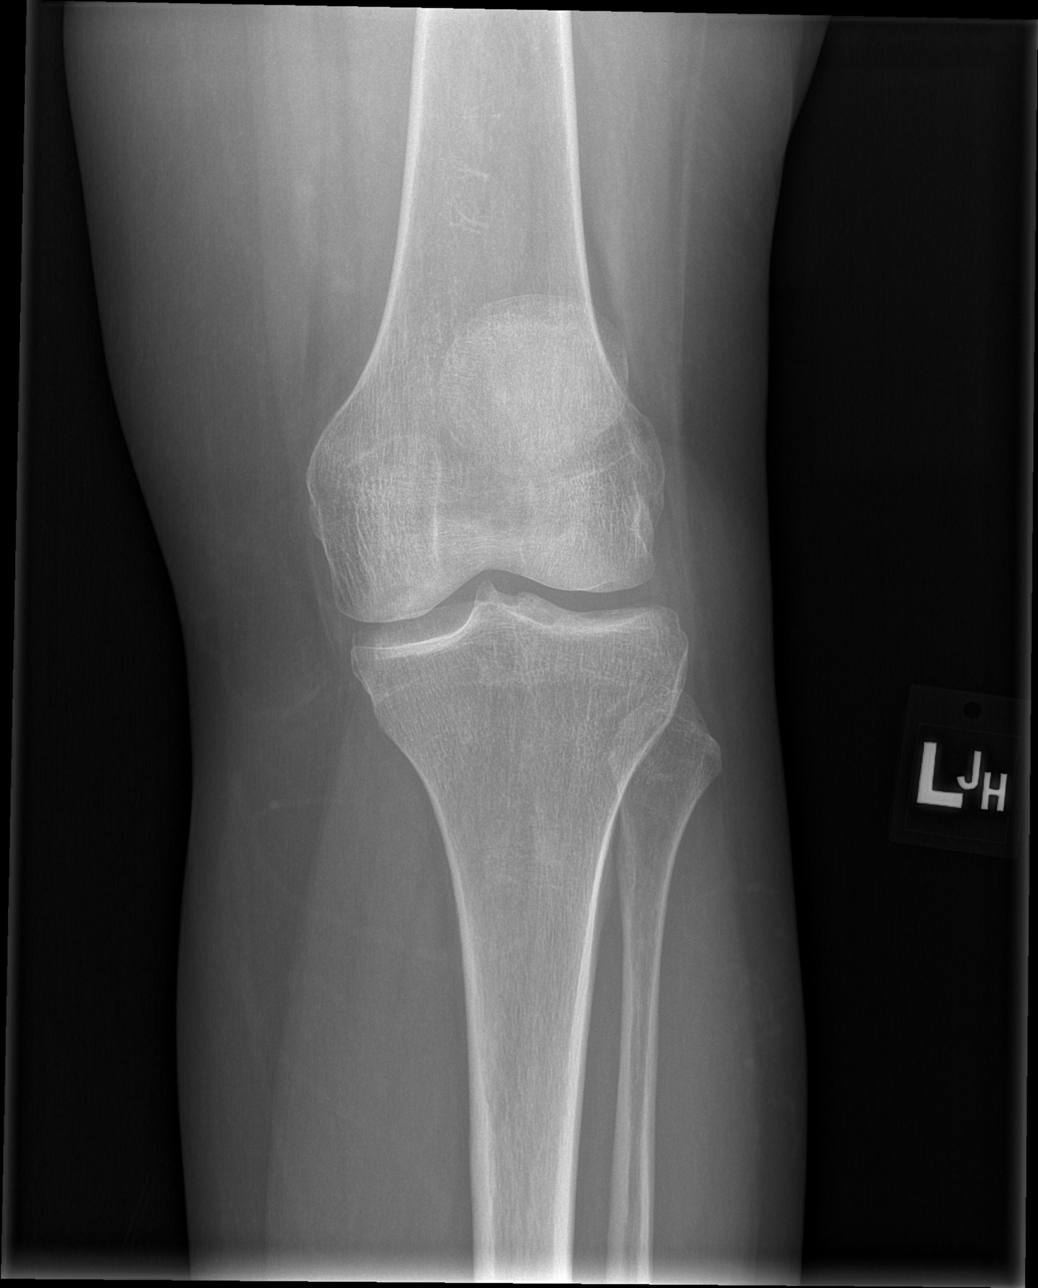

[t knee obl left (1 of 2)]
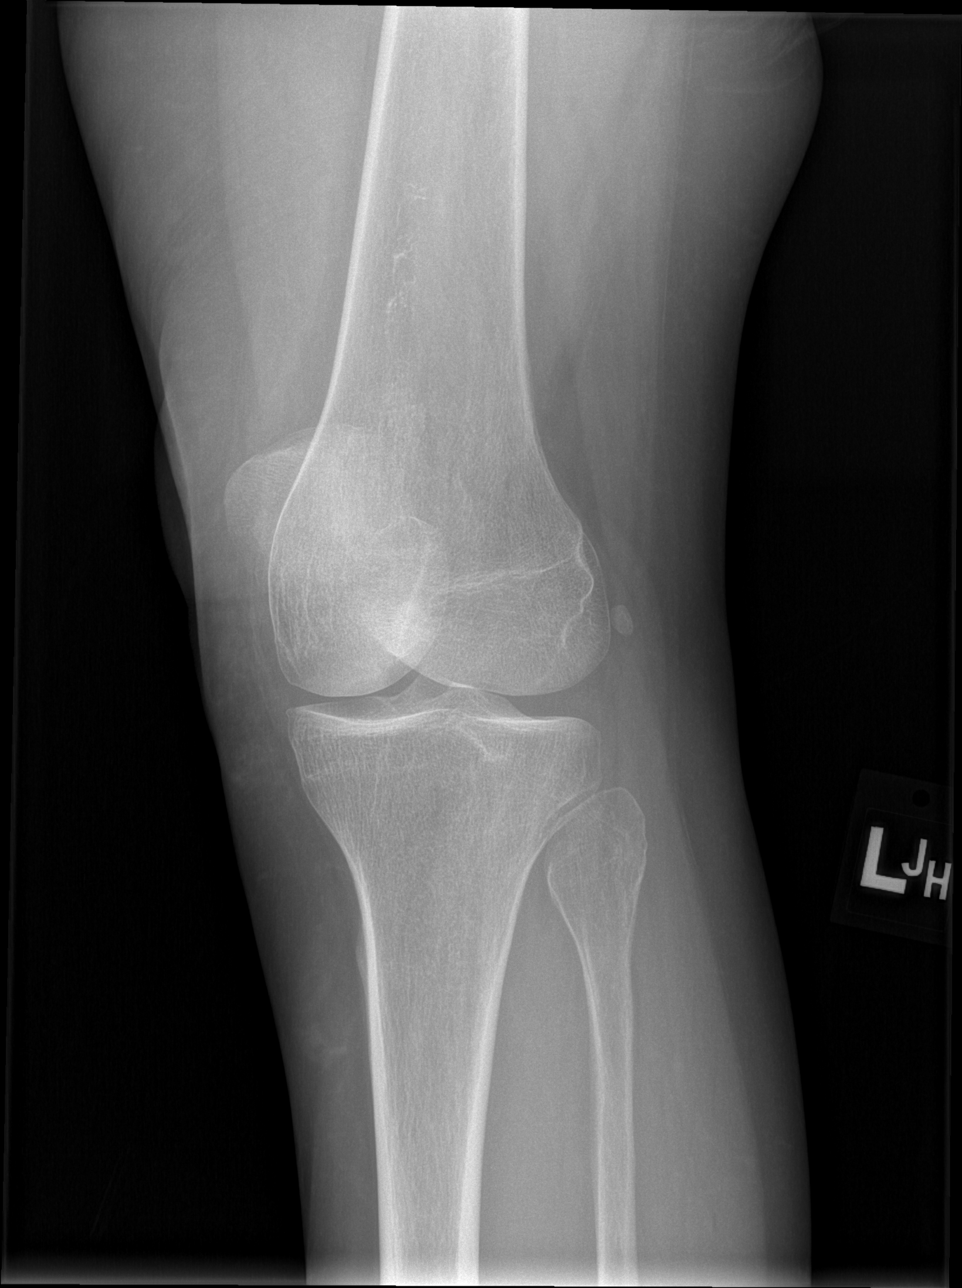

[t knee obl left (2 of 2)]
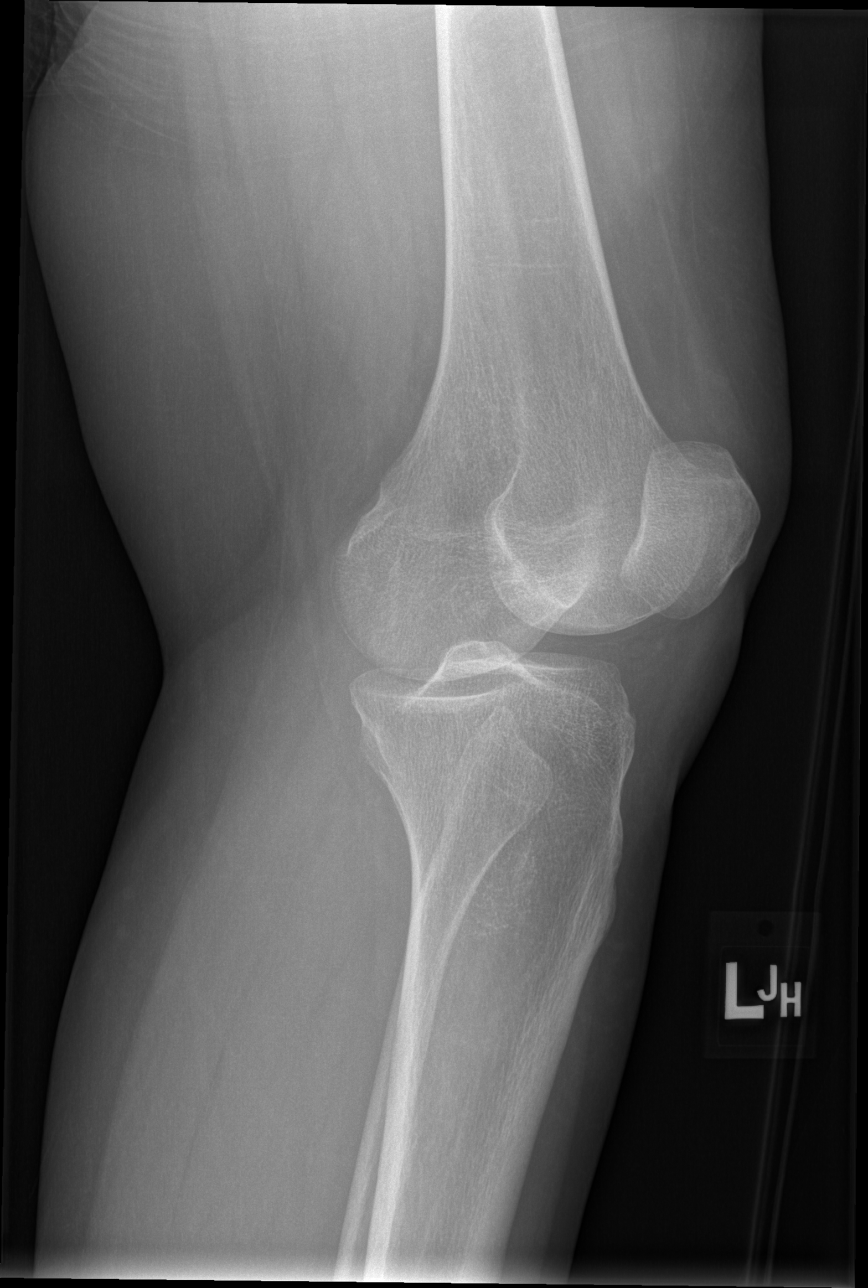

[t knee lat left]
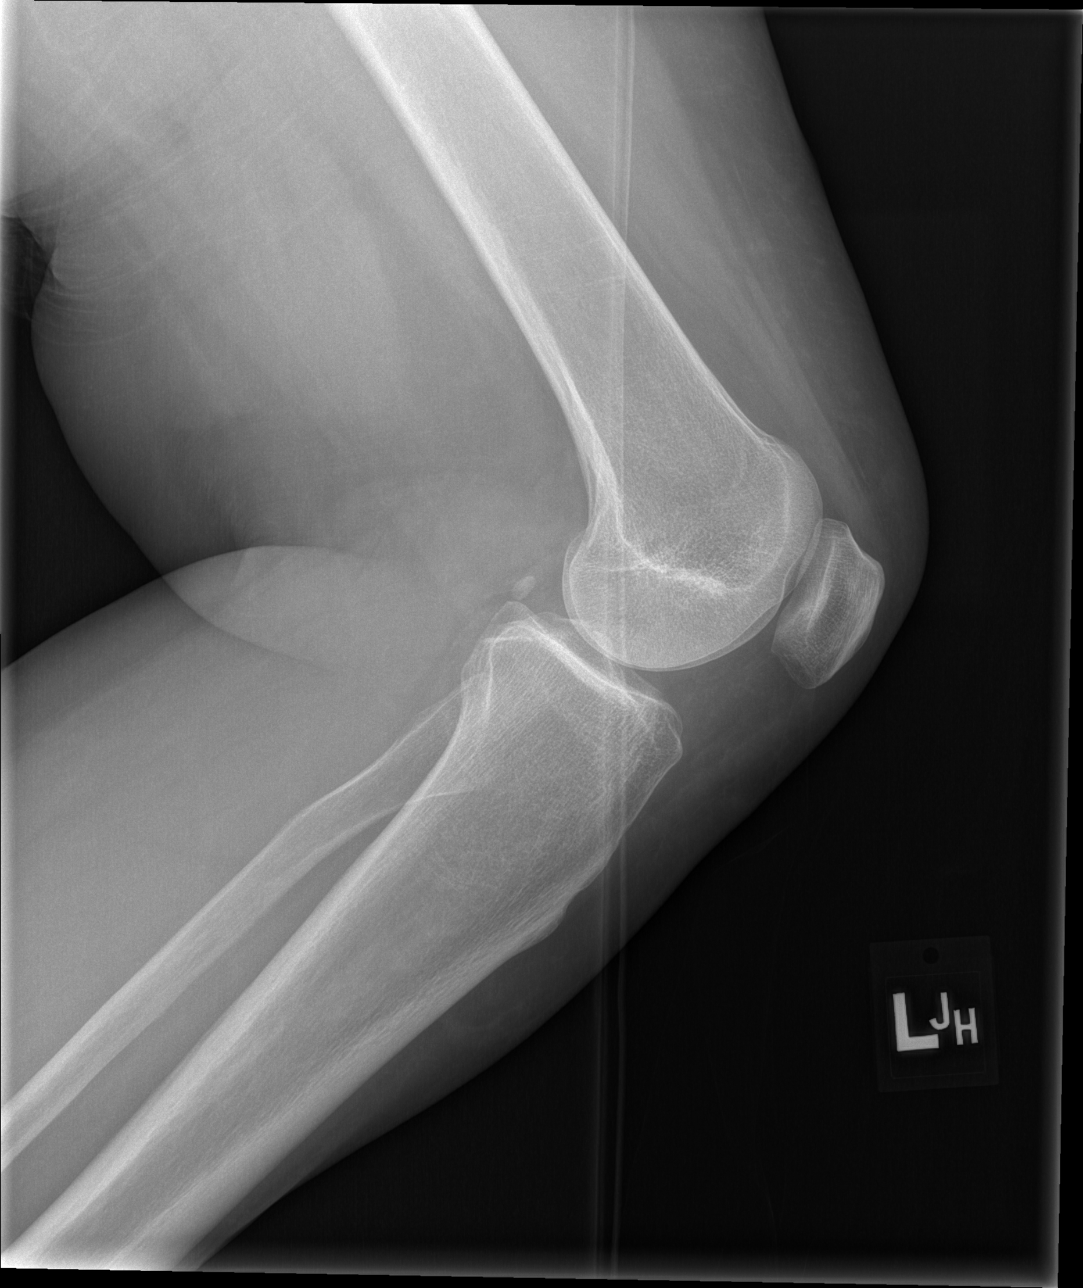

[4 of 4 positions shown; findings below may reference images not displayed]

FINDINGS: No evidence of fracture, dislocation, or joint effusion. No evidence
of arthropathy or other focal bone abnormality. Soft tissues are
unremarkable.
IMPRESSION: Negative.

## 2017-01-24 NOTE — ED Triage Notes (Addendum)
Pt ambulating in the hall without difficulty. Slight limp noted

## 2017-01-24 NOTE — ED Triage Notes (Signed)
Pt reports she tripped over some rocks yesterday and fell. Pt complains of R ankle pain, L knee pain (bruising noted), and L elbow abrasions. Pt ambulatory.

## 2017-01-24 NOTE — ED Provider Notes (Signed)
[facility] DEPT Provider Note   CSN: [id number] Arrival date & time: 01/24/17  1133     History   Chief Complaint Chief Complaint  Patient presents with  . Ankle Pain    HPI  Ashley Mayo is a 50 y.o. 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 Patient reports she tripped on rocks in a cookout parking lot yesterday and twisted her right ankle and came down landing on her left knee. Patient denies hitting her head, no loss of consciousness, blurry vision, nausea or vomiting. Patient reports since then she has had swelling and pain of the right ankle, with discomfort when bearing weight. Patient also reports pain over the left knee with some bruising present, still able to fully bend and extend the knee. Patient also reports some occasional intermittent twinges of pain in her right hip, reports she previously had a hip fracture and has hardware in place there. Patient has a skin abrasion to the left elbow, but denies additional pain or swelling to this area and reports normal range of motion of the left arm. Denies any pain elsewhere. Patient reports she's been taking her home pain medication, 10 mg of Percocet, which has been helping. Patient is prescribed this chronic pain medication as she is currently undergoing treatment for stage IV cancer.      Past Medical History:  Diagnosis Date  . Anxiety   . Asthma   . Cancer ([place])    lung cancer  . COPD (chronic obstructive pulmonary disease) ([place])   . Depression   . GERD (gastroesophageal reflux disease)   . Hypertension   . Migraine     Patient Active Problem List   Diagnosis Date Noted  . Perforated ear drum, bilateral 01/25/2016  . Pneumonia 01/23/2016  .  Healthcare-associated pneumonia 01/23/2016  . Hypokalemia 01/23/2016  . Anxiety 01/23/2016  . Depression 01/23/2016  . Metastatic lung cancer (metastasis from lung to other site) ([place]) 01/23/2016  . Brain metastasis ([place]) 01/23/2016  . Essential hypertension 01/23/2016    Past Surgical History:  Procedure Laterality Date  . ABDOMINAL HYSTERECTOMY    . CESAREAN SECTION    . ORIF right hip fracture      OB History    No data available       Home Medications    Prior to Admission medications   Medication Sig Start Date End Date Taking? Authorizing Provider  acetaminophen (TYLENOL) 500 MG tablet Take 500-1,000 mg by mouth every 6 (six) hours as needed for mild pain, moderate pain, fever or headache.     [provider]  ADDERALL XR 30 MG 24 hr capsule Take 30 mg by mouth daily.    [provider]  albuterol (PROVENTIL HFA;VENTOLIN HFA) 108 (90 Base) MCG/ACT inhaler Inhale 1-2 puffs into the lungs every 6 (six) hours as needed for wheezing or shortness of breath.    [provider]  albuterol (PROVENTIL) (2.5 MG/3ML) 0.083% nebulizer solution Take 2.5 mg by nebulization every 6 (six) hours as needed for wheezing or shortness of breath.     [provider]  amphetamine-dextroamphetamine (ADDERALL) 10 MG tablet Take 10 mg by mouth every evening.    [provider]  buPROPion (WELLBUTRIN SR) 200 MG 12 hr tablet Take 1 tablet (200  mg total) by mouth 2 (two) times daily. 02/07/16   Domenic Moras, PA-C  diphenhydrAMINE (BENADRYL) 25 MG tablet Take 25 mg by mouth at bedtime as needed for sleep.    [provider]  griseofulvin (GRIS-PEG) 250 MG tablet Take 375 mg by mouth 2 (two) times daily.    [provider]  ketoconazole (NIZORAL) 2 % shampoo Apply 1 application topically daily.    [provider]  levofloxacin (LEVAQUIN) 750 MG tablet Take 1 tablet (750 mg total) by mouth daily. Take for 7 days 05/12/16   Blanchie Dessert, MD  lidocaine (LIDODERM) 5 % Place 1-2 patches onto the skin daily as needed (for back pain).     [provider]  LORazepam (ATIVAN) 0.5 MG tablet Take 0.5 mg by mouth every 6 (six) hours as needed for anxiety.    [provider]  metoprolol succinate (TOPROL-XL) 25 MG 24 hr tablet Take 25 mg by mouth daily.    [provider]  nystatin (MYCOSTATIN) 100000 UNIT/ML suspension Take 5 mLs by mouth 4 (four) times daily as needed (for thrush).     [provider]  nystatin (NYSTATIN) powder Apply 1 g topically 4 (four) times daily as needed (for rash).    [provider]  oxyCODONE (OXYCONTIN) 10 mg 12 hr tablet Take 10 mg by mouth every 12 (twelve) hours.    [provider]  oxyCODONE-acetaminophen (PERCOCET/ROXICET) 5-325 MG tablet Take 1 tablet by mouth every 4 (four) hours as needed for severe pain.    [provider]  pantoprazole (PROTONIX) 40 MG tablet Take 40 mg by mouth daily.    [provider]  pembrolizumab (KEYTRUDA) 100 MG/4ML SOLN Inject 2 mg/kg into the vein every 21 ( twenty-one) days.    [provider]  phenazopyridine (PYRIDIUM) 200 MG tablet Take 1 tablet (200 mg total) by mouth 3 (three) times daily. 05/12/16   Blanchie Dessert, MD  prochlorperazine (COMPAZINE) 10 MG tablet Take 10 mg by mouth every 6 (six) hours as needed for nausea or vomiting.    [provider]  SUMAtriptan (IMITREX) 6 MG/0.5ML SOLN injection Inject 0.5 mLs (6 mg total) into the skin every 2 (two) hours as needed for migraine or headache. May repeat in 2 hours if headache persists or recurs. 05/12/16   Blanchie Dessert, MD  valACYclovir (VALTREX) 500 MG tablet Take 500 mg by mouth 2 (two) times daily as needed (for cold sores).     [provider]    Family History Family History  Problem Relation Age of Onset  . Breast cancer Mother     Social History Social History   Tobacco Use  . Smoking  status: Former Smoker    Packs/day: 0.25    Years: 35.00    Pack years: 8.75    Types: Cigarettes  . Smokeless tobacco: Never Used  Substance Use Topics  . Alcohol use: No  . Drug use: No     Allergies   Codeine and Shellfish allergy   Review of Systems Review of Systems  Constitutional: Negative for chills and fever.  Eyes: Negative for pain and visual disturbance.  Cardiovascular: Negative for chest pain.  Gastrointestinal: Negative for abdominal pain, nausea and vomiting.  Musculoskeletal: Positive for arthralgias, gait problem and joint swelling.  Skin: Positive for wound.  Neurological: Negative for dizziness, weakness and numbness.     Physical Exam Updated Vital Signs BP (!) 125/91   Pulse (!) 104   Temp 98.2  F (36.8 C) (Oral)   Resp 16   SpO2 97%   Physical Exam  Constitutional: She appears well-developed and well-nourished. No distress.  HENT:  Head: Normocephalic and atraumatic.  Eyes: Right eye exhibits no discharge. Left eye exhibits no discharge.  Pulmonary/Chest: Effort normal. No respiratory distress.  Musculoskeletal:  Right ankle with swelling and tenderness over the lateral malleolus. No overt deformity. No tenderness over the medial aspect of the ankle. The fifth metatarsal is not tender. The ankle joint is intact without excessive opening on stressing. DP and TP pulses 2+, good capillary refill, sensation intact, patient able to dorsi and plantar flex foot with some discomfort. No pain on palpation of the right knee, normal range of motion. Left knee with several small ecchymosis over the patella and medial aspect, mild tenderness to palpation over the ecchymosis, no evidence of effusion, no erythema, no obvious deformity, full flexion and extension against resistance with minimal discomfort, distal pulses intact, sensation normal. 4 cm abrasion over the posterior aspect of the left elbow, scabbed over and well healing, no swelling, minimal  tenderness, full range of motion, radial pulses 2+, and sensation intact  Neurological: She is alert. Coordination normal.  Skin: She is not diaphoretic.  Psychiatric: She has a normal mood and affect. Her behavior is normal.  Nursing note and vitals reviewed.    ED Treatments / Results  Labs (all labs ordered are listed, but only abnormal results are displayed) Labs Reviewed - No data to display  EKG  EKG Interpretation None       Radiology Dg Ankle Complete Right  Result Date: 01/24/2017 CLINICAL DATA:  Patient fell yesterday. EXAM: RIGHT ANKLE - COMPLETE 3+ VIEW COMPARISON:  None. FINDINGS: No evidence for an acute fracture. No subluxation or dislocation. Deformity of the distal fibula suggests remote injury. Degenerative changes are noted in the posterior tibiotalar joint. IMPRESSION: No acute bony abnormality. Electronically Signed   By: Misty Stanley M.D.   On: 01/24/2017 12:55   Dg Knee Complete 4 Views Left  Result Date: 01/24/2017 CLINICAL DATA:  Fall yesterday.  Pain. EXAM: LEFT KNEE - COMPLETE 4+ VIEW COMPARISON:  None. FINDINGS: No evidence of fracture, dislocation, or joint effusion. No evidence of arthropathy or other focal bone abnormality. Soft tissues are unremarkable. IMPRESSION: Negative. Electronically Signed   By: Misty Stanley M.D.   On: 01/24/2017 12:56   Dg Hip Unilat W Or Wo Pelvis 2-3 Views Right  Result Date: 01/24/2017 CLINICAL DATA:  Fall yesterday.  Right hip pain.  Initial encounter. EXAM: DG HIP (WITH OR WITHOUT PELVIS) 2-3V RIGHT COMPARISON:  None. FINDINGS: Sliding screw -plate fixation device is seen in the left hip. No evidence of acute fracture or dislocation. No other significant bone abnormality identified. IMPRESSION: No acute findings. Electronically Signed   By: Earle Gell M.D.   On: 01/24/2017 13:32    Procedures Procedures (including critical care time)  Medications Ordered in ED Medications - No data to display   Initial  Impression / Assessment and Plan / ED Course  I have reviewed the triage vital signs and the nursing notes.  Pertinent labs & imaging results that were available during my care of the patient were reviewed by me and considered in my medical decision making (see chart for details).  Patient presents with right ankle pain, left knee pain and left elbow abrasion after mechanical fall yesterday. Presentation consistent with right ankle sprain. Tenderness and swelling over medial malleolus, pt is neurovascularly intact, and  x-ray negative for fracture, and shows ankle mortise is intact. Left knee with mild tenderness, neurovascularly intact, x-rays negative for fracture. Given patient's previous right hip surgery, x-ray obtained, and shows no fracture or dislocation, and hardware is in place. Pain treated in the ED. Pt placed in ASO brace and provided crutches, ambulated without difficulty. Pt stable for discharge home with home pain regimen. Pt to follow-up with ortho in one week if symptoms not improving. Return precautions discussed, Pt expresses understanding and agrees with plan.   Final Clinical Impressions(s) / ED Diagnoses   Final diagnoses:  Sprain of right ankle, unspecified ligament, initial encounter  Acute pain of left knee  Abrasion of left elbow, initial encounter    ED Discharge Orders    None       Jacqlyn Larsen, [place] 01/24/17 1804    Gareth Morgan, MD 02/01/17 1756

## 2017-01-24 NOTE — Discharge Instructions (Signed)
All x-rays show no evidence of acute fracture or dislocation. Your right ankle is likely sprained, wear ankle brace and use crutches to help stay off the ankle, keep elevated with ice as much as possible. Your left knee appears to have some soft tissue bruising, this will likely improve over the next few days, you can apply ice. Continue using your home pain medication. If symptoms are not improving after one week please follow-up with orthopedics. If you have worsening pain, swelling, are unable to move the joints, or you start to experience numbness, tingling or weakness please return to the ED sooner for reevaluation.

## 2017-12-30 ENCOUNTER — Emergency Department (HOSPITAL_COMMUNITY): Payer: Medicaid Other

## 2017-12-30 ENCOUNTER — Encounter (HOSPITAL_COMMUNITY): Payer: Self-pay

## 2017-12-30 ENCOUNTER — Emergency Department (HOSPITAL_BASED_OUTPATIENT_CLINIC_OR_DEPARTMENT_OTHER): Payer: Medicaid Other

## 2017-12-30 ENCOUNTER — Other Ambulatory Visit: Payer: Self-pay

## 2017-12-30 ENCOUNTER — Emergency Department (HOSPITAL_COMMUNITY)
Admission: EM | Admit: 2017-12-30 | Discharge: 2017-12-30 | Disposition: A | Discharge status: Home / Self Care | Payer: Medicaid Other | Attending: Emergency Medicine | Admitting: Emergency Medicine

## 2017-12-30 DIAGNOSIS — R609 Edema, unspecified: Secondary | ICD-10-CM

## 2017-12-30 DIAGNOSIS — J441 Chronic obstructive pulmonary disease with (acute) exacerbation: Secondary | ICD-10-CM | POA: Insufficient documentation

## 2017-12-30 DIAGNOSIS — R0602 Shortness of breath: Secondary | ICD-10-CM

## 2017-12-30 DIAGNOSIS — Z79899 Other long term (current) drug therapy: Secondary | ICD-10-CM | POA: Insufficient documentation

## 2017-12-30 DIAGNOSIS — I10 Essential (primary) hypertension: Secondary | ICD-10-CM | POA: Diagnosis not present

## 2017-12-30 DIAGNOSIS — Z87891 Personal history of nicotine dependence: Secondary | ICD-10-CM | POA: Insufficient documentation

## 2017-12-30 LAB — BASIC METABOLIC PANEL
Anion gap: 12 (ref 5–15)
BUN: 32 mg/dL — ABNORMAL HIGH (ref 6–20)
CO2: 28 mmol/L (ref 22–32)
Calcium: 8.9 mg/dL (ref 8.9–10.3)
Chloride: 98 mmol/L (ref 98–111)
Creatinine, Ser: 1.07 mg/dL — ABNORMAL HIGH (ref 0.44–1.00)
GFR calc Af Amer: >60 mL/min (ref >60)
GFR calc non Af Amer: 59 mL/min — ABNORMAL LOW (ref >60)
Glucose, Bld: 150 mg/dL — ABNORMAL HIGH (ref 70–99)
Potassium: 3.6 mmol/L (ref 3.5–5.1)
Sodium: 138 mmol/L (ref 135–145)

## 2017-12-30 LAB — DIFFERENTIAL
Basophils Absolute: 0 10*3/uL (ref 0.0–0.1)
Basophils Relative: 0 %
Eosinophils Absolute: 0 10*3/uL (ref 0.0–0.5)
Eosinophils Relative: 0 %
Lymphocytes Relative: 10 %
Lymphs Abs: 1 10*3/uL (ref 0.7–4.0)
Monocytes Absolute: 0.4 10*3/uL (ref 0.1–1.0)
Monocytes Relative: 4 %
Neutro Abs: 8.4 10*3/uL — ABNORMAL HIGH (ref 1.7–7.7)
Neutrophils Relative %: 85 %

## 2017-12-30 LAB — CBC
HCT: 42.2 % (ref 36.0–46.0)
Hemoglobin: 13.3 g/dL (ref 12.0–15.0)
MCH: 29.2 pg (ref 26.0–34.0)
MCHC: 31.5 g/dL (ref 30.0–36.0)
MCV: 92.5 fL (ref 80.0–100.0)
Platelets: 336 10*3/uL (ref 150–400)
RBC: 4.56 MIL/uL (ref 3.87–5.11)
RDW: 18.3 % — ABNORMAL HIGH (ref 11.5–15.5)
WBC: 9.9 10*3/uL (ref 4.0–10.5)
nRBC: 0 % (ref 0.0–0.2)

## 2017-12-30 LAB — BRAIN NATRIURETIC PEPTIDE: B Natriuretic Peptide: 28.4 pg/mL (ref 0.0–100.0)

## 2017-12-30 LAB — I-STAT BETA HCG BLOOD, ED (MC, WL, AP ONLY): I-stat hCG, quantitative: <5 m[IU]/mL (ref <5)

## 2017-12-30 LAB — I-STAT TROPONIN, ED: Troponin i, poc: 0.01 ng/mL (ref 0.00–0.08)

## 2017-12-30 IMAGING — CR DG CHEST 2V
2 series · 2 of 2 positions shown · non-contrast
Comparison: [DATE].

CLINICAL DATA: RIGHT chest pain.

EXAM:
CHEST - 2 VIEW

[w chest pa]
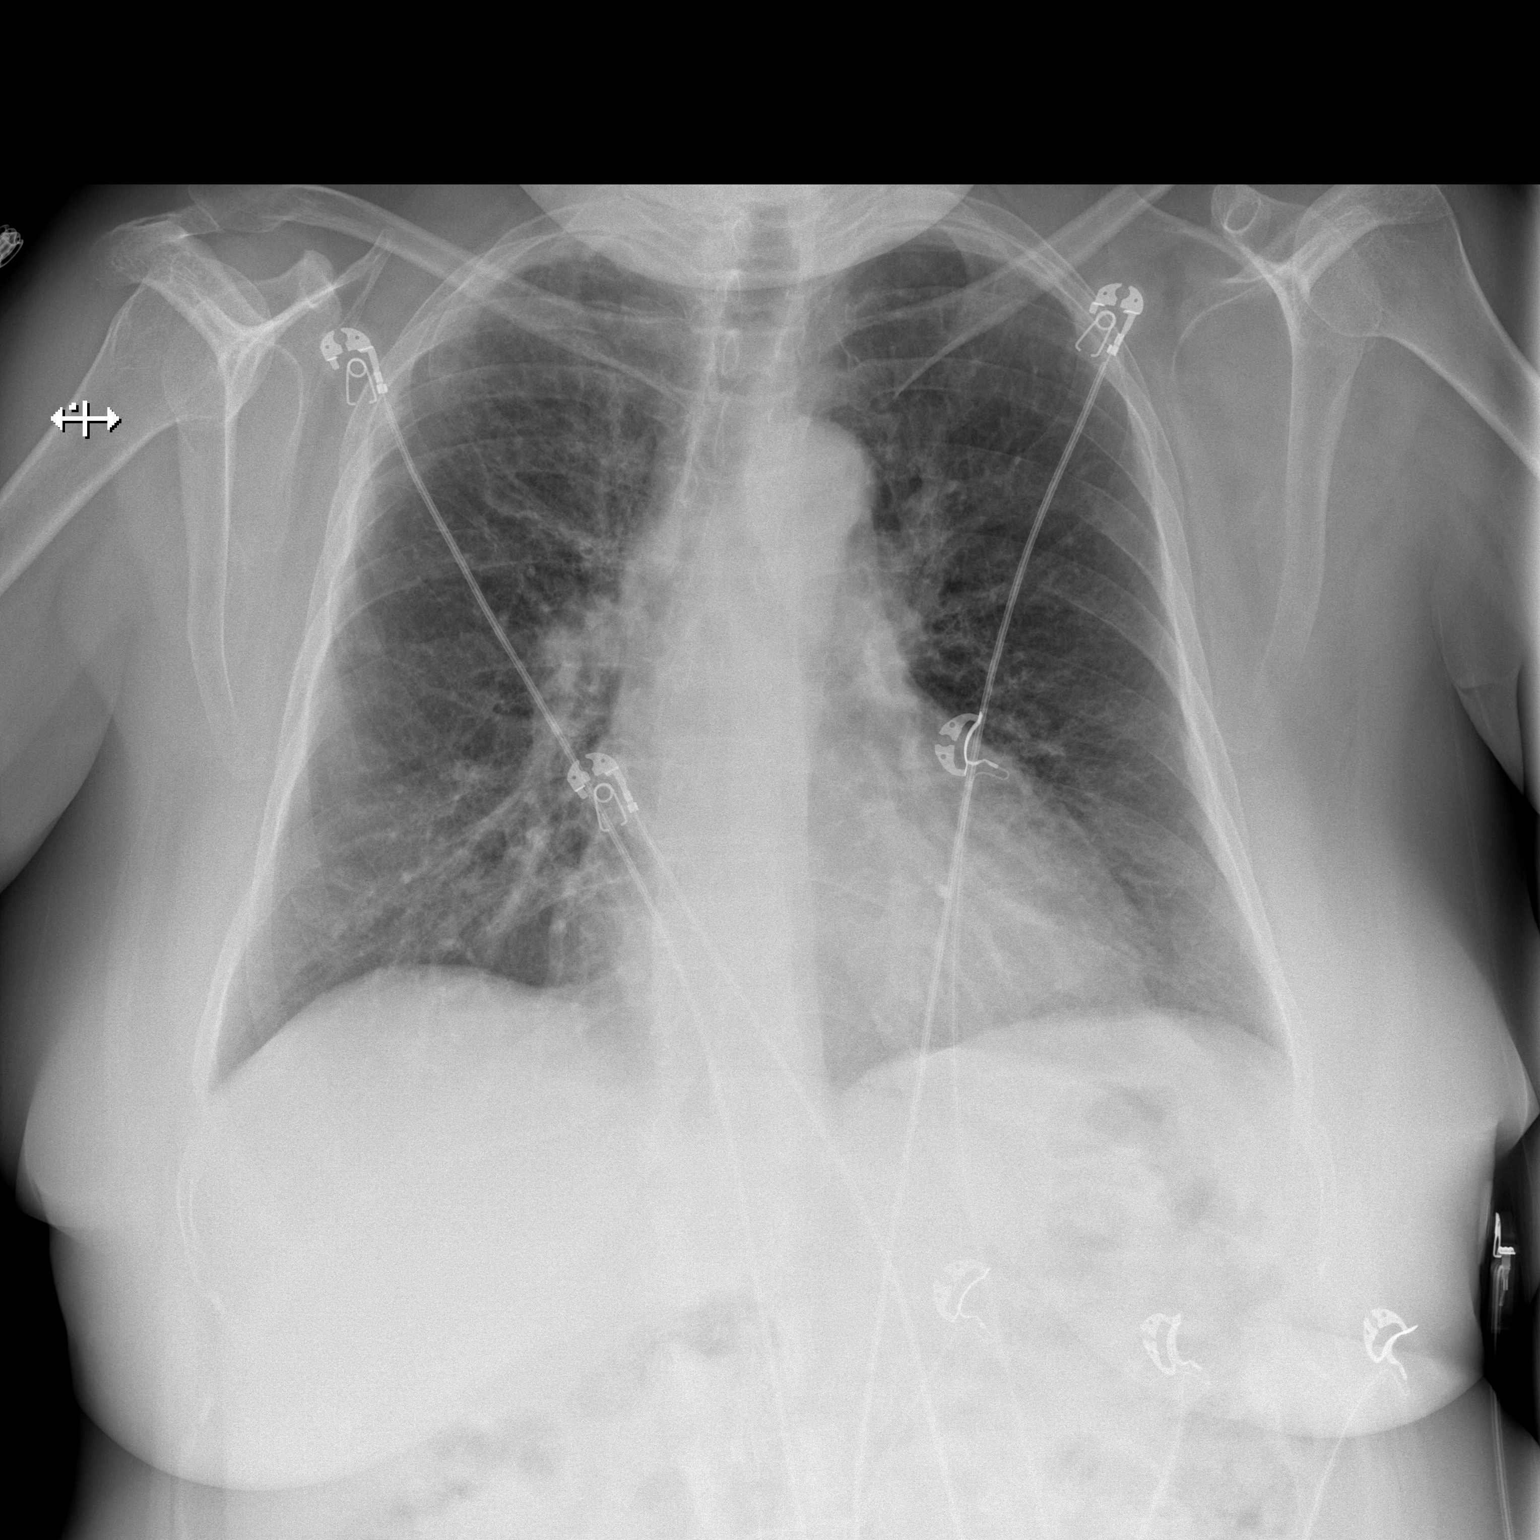

[w chest lat]
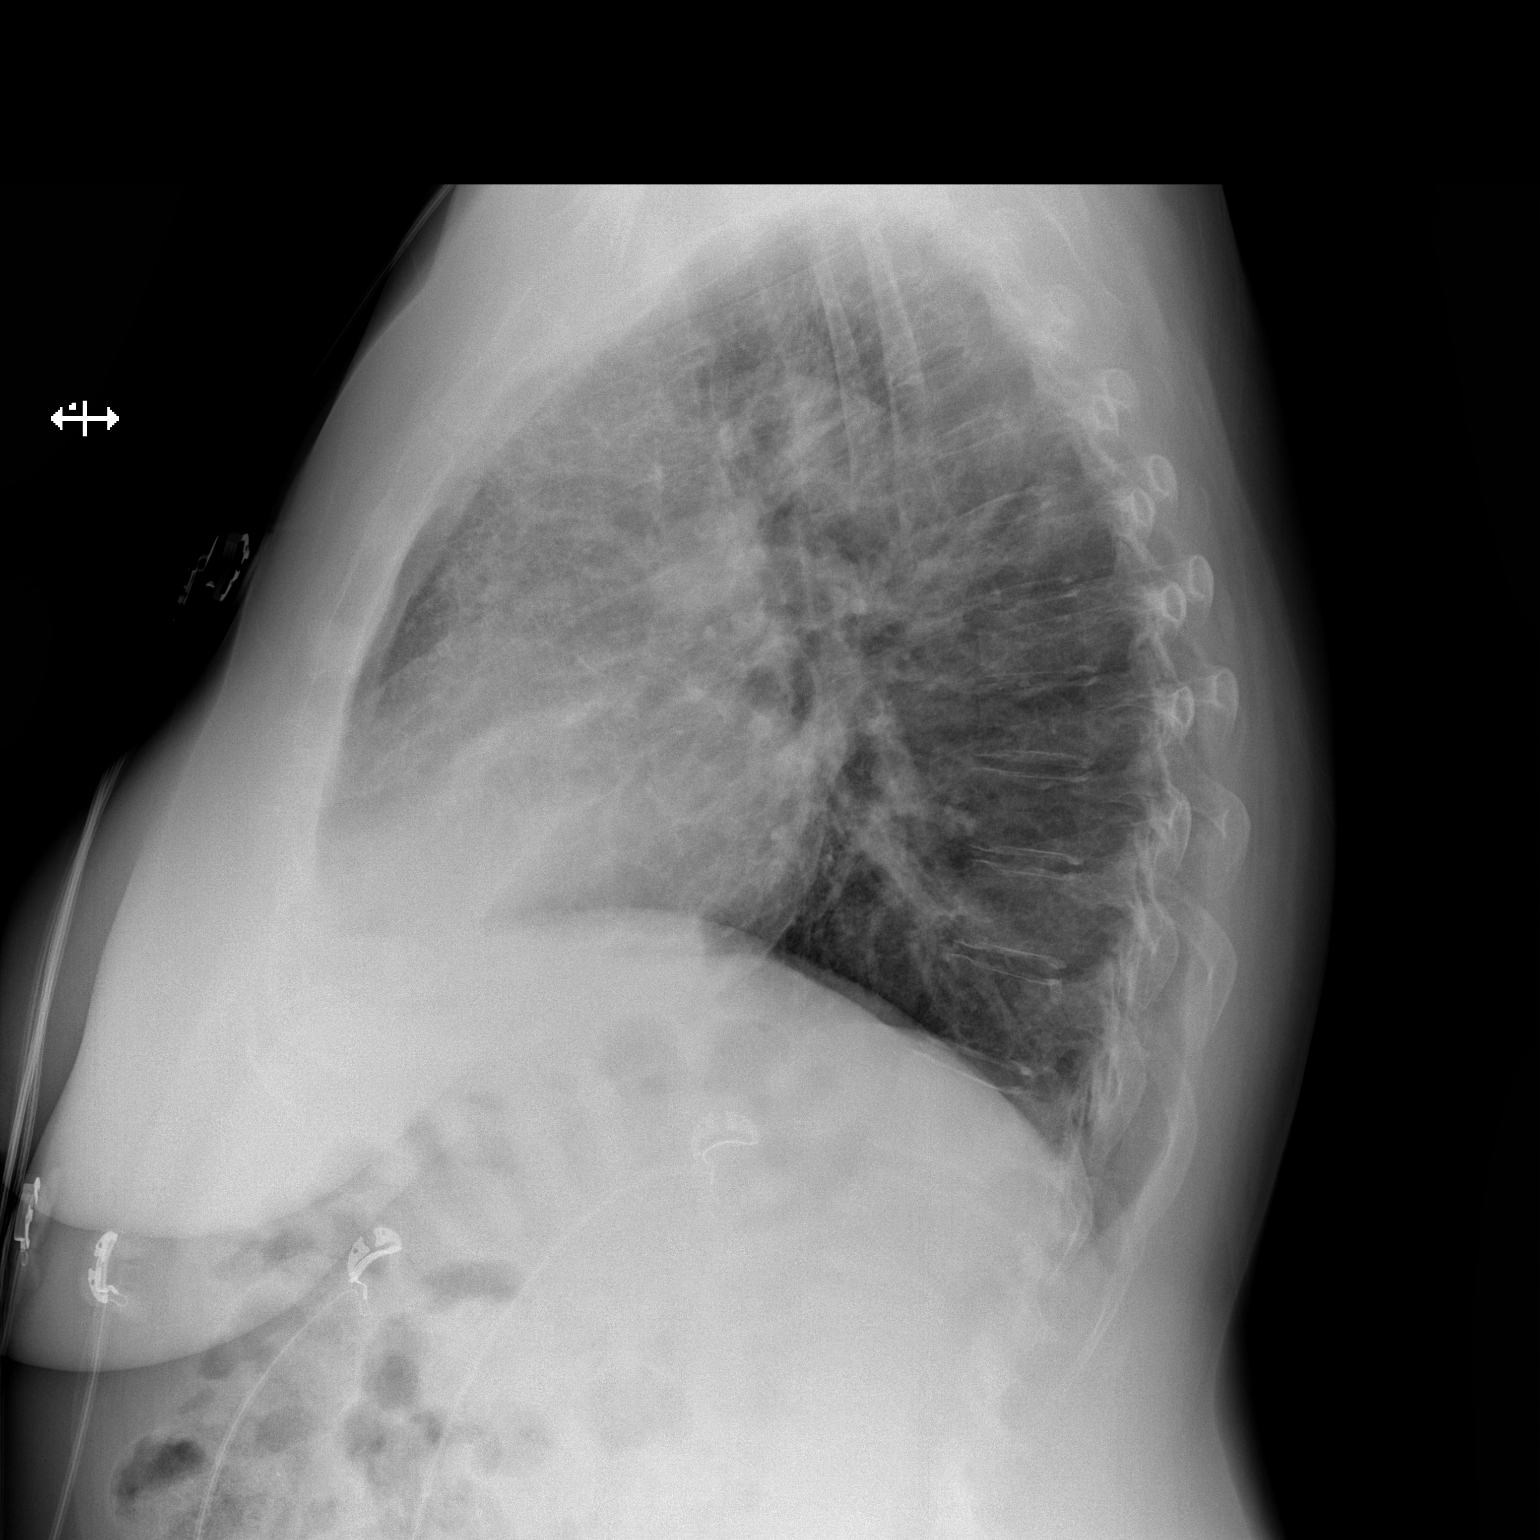

[2 of 2 positions shown; findings below may reference images not displayed]

FINDINGS: The heart is enlarged. Mild vascular congestion. No consolidation or
edema. No effusion or pneumothorax. Bones demonstrate osteopenia.
IMPRESSION: Cardiomegaly.  No active disease.  Similar appearance to priors.

## 2017-12-30 IMAGING — CT CT ANGIO CHEST
2 of 7 series · 18 of 46 positions shown · IV contrast (ISOVUE 370)
Comparison: Chest radiography same day.

CLINICAL DATA: Bilateral leg swelling not relieved by diuretics.
History of lung and brain cancer.

EXAM:
CT ANGIOGRAPHY CHEST WITH CONTRAST
TECHNIQUE: Multidetector CT imaging of the chest was performed using the
standard protocol during bolus administration of intravenous
contrast. Multiplanar CT image reconstructions and MIPs were
obtained to evaluate the vascular anatomy.
CONTRAST:  100mL [SU] IOPAMIDOL ([SU]) INJECTION 76%

[Series 5: thins · axial · 0.65mm/px · z∈[-272,-31]mm · 15 of 275 slices shown]
[im 17/275  lung]
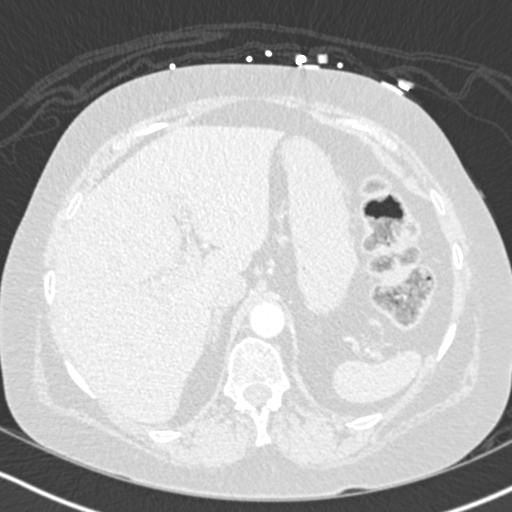
[im 33/275  soft-tissue]
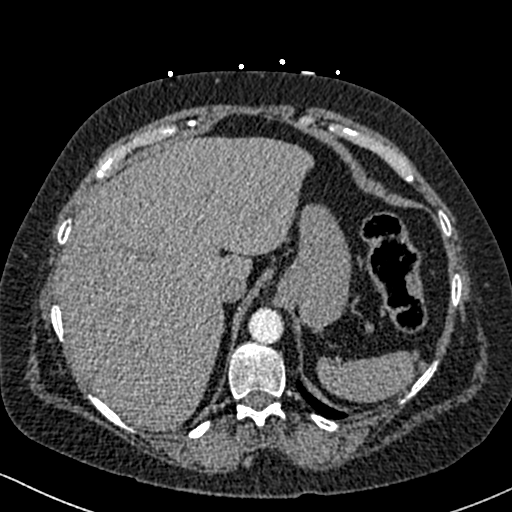
[im 49/275  lung]
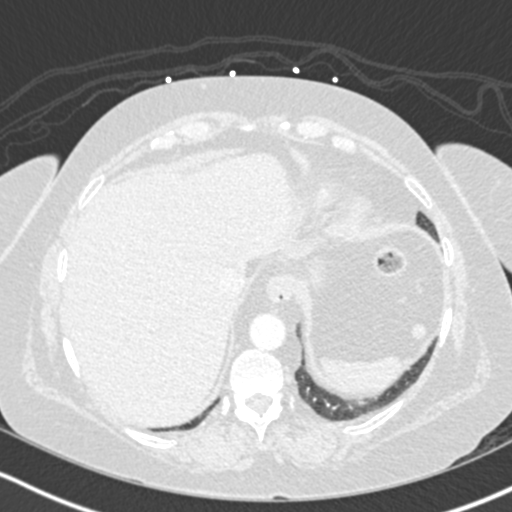
[im 65/275  soft-tissue]
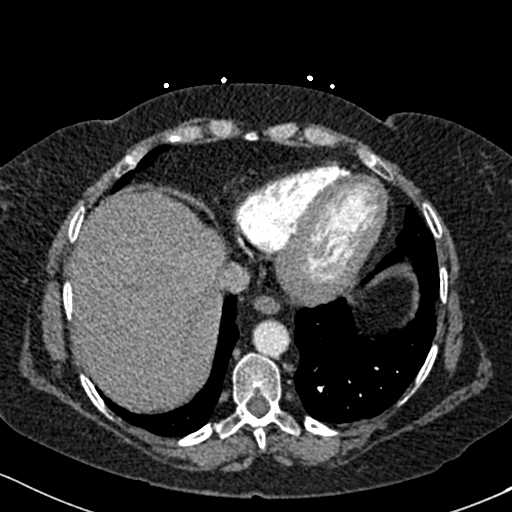
[im 81/275  lung]
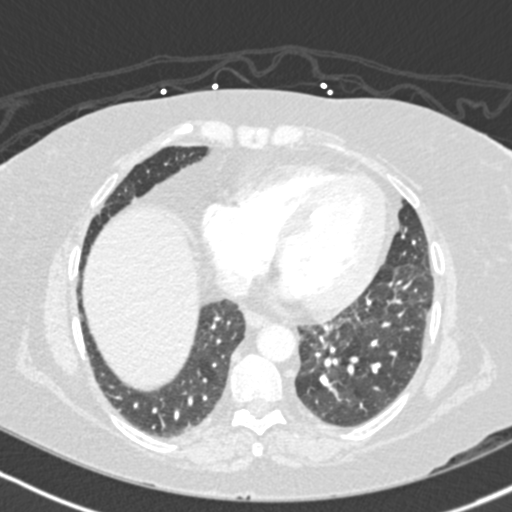
[im 97/275  soft-tissue]
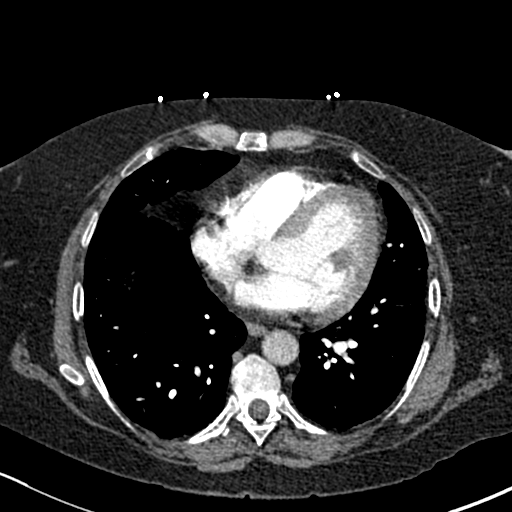
[im 113/275  lung]
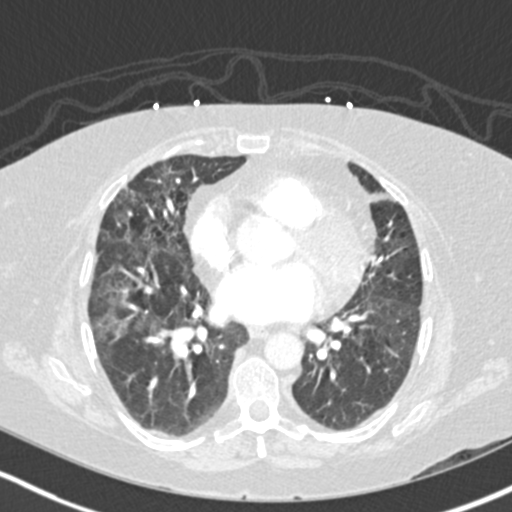
[im 146/275  soft-tissue]
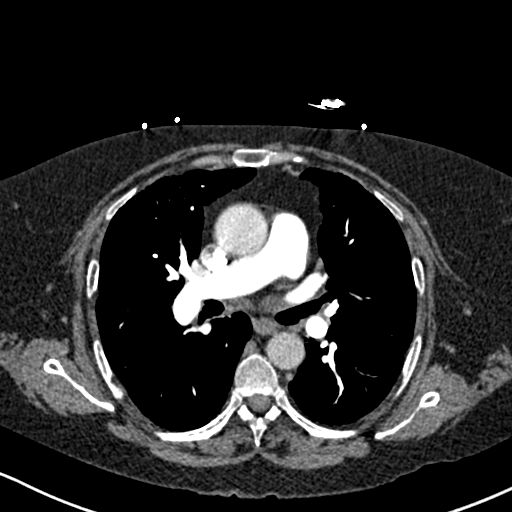
[im 162/275  lung]
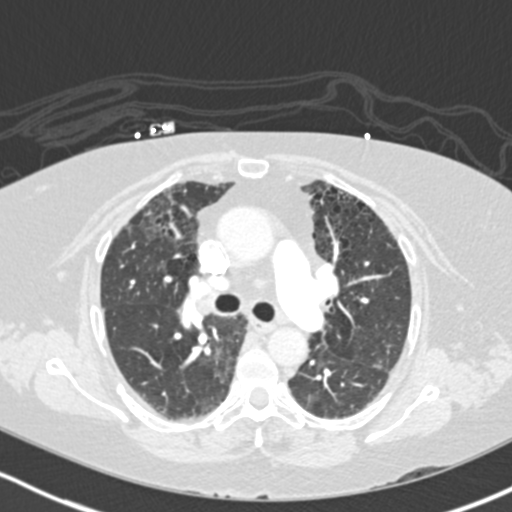
[im 178/275  soft-tissue]
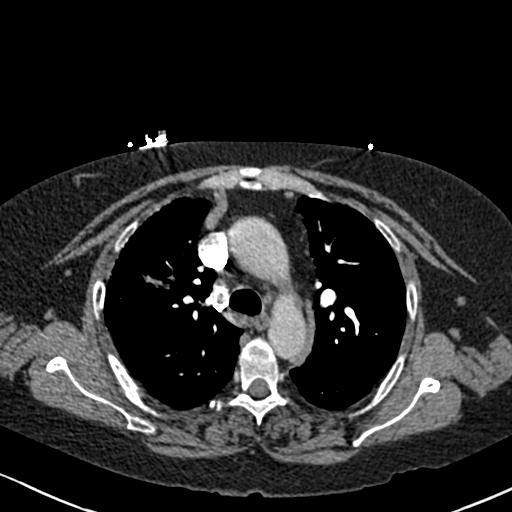
[im 194/275  lung]
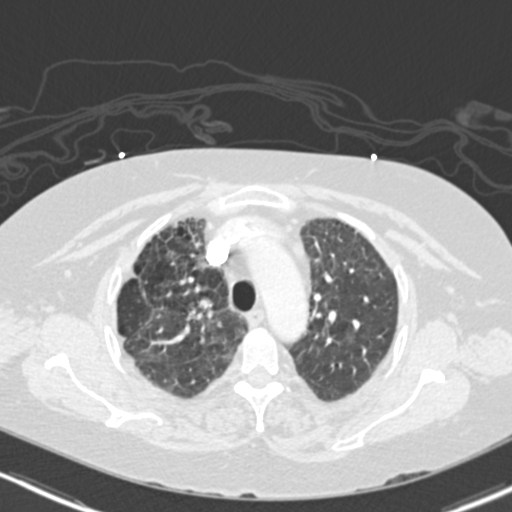
[im 210/275  soft-tissue]
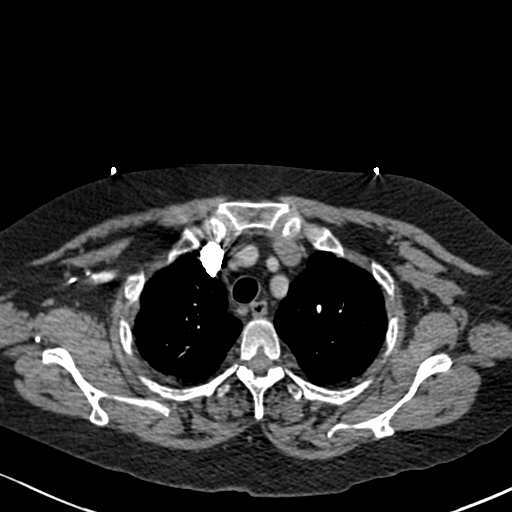
[im 226/275  lung]
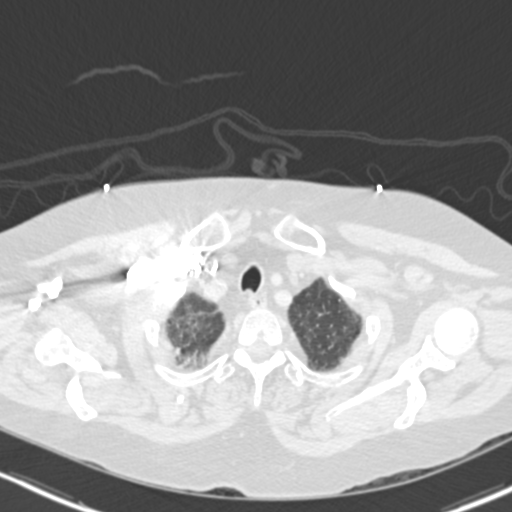
[im 242/275  soft-tissue]
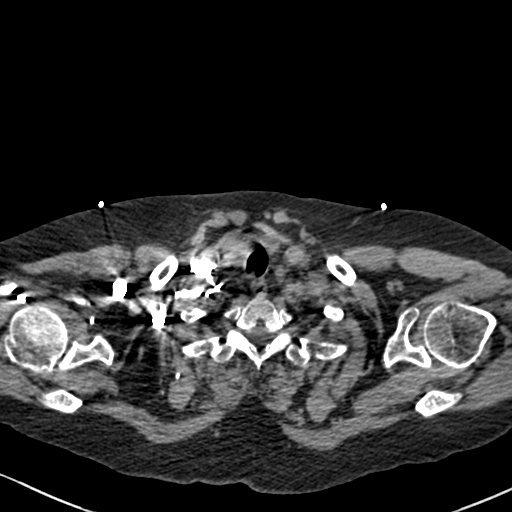
[im 258/275  lung]
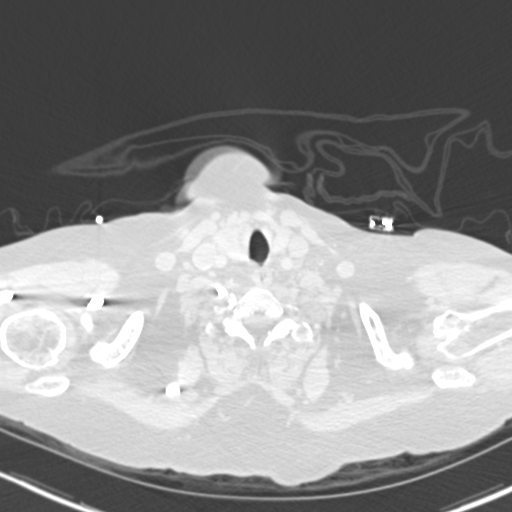

[Series 6: coronal mpr · coronal · 0.55mm/px · 3 of 155 slices shown]
[im 39/155  soft-tissue]
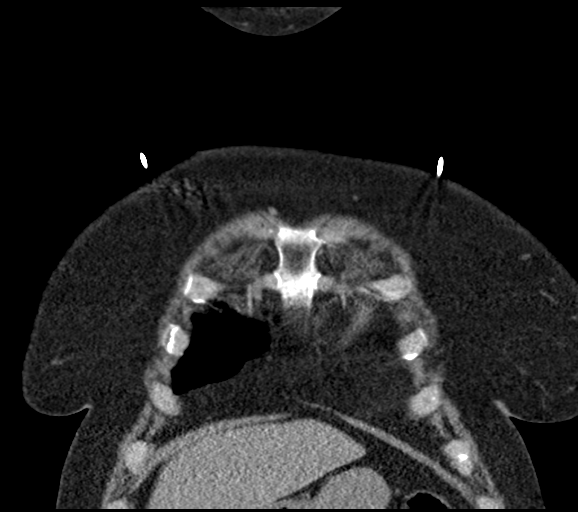
[im 78/155  soft-tissue]
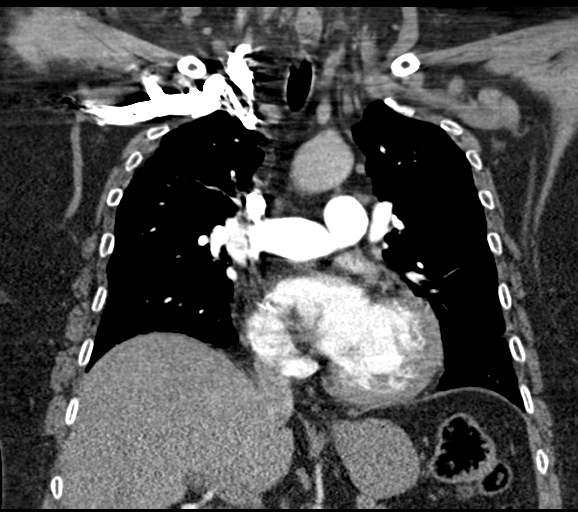
[im 116/155  soft-tissue]
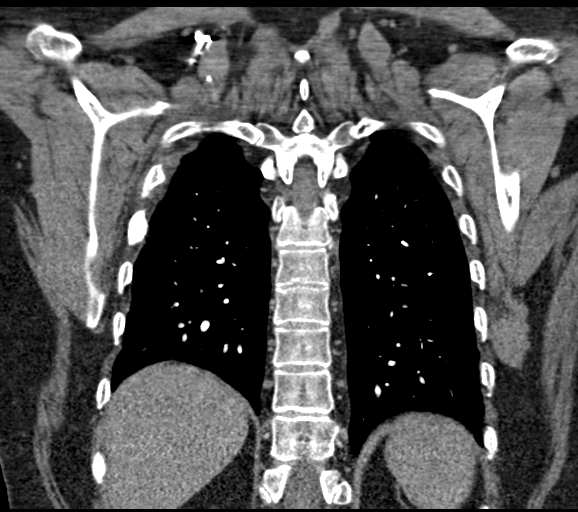

[18 of 46 positions shown; findings below may reference images not displayed]

FINDINGS: Cardiovascular: Pulmonary arterial opacification is excellent. There
are no pulmonary emboli. Heart size is normal. There is coronary
artery calcification. No pericardial fluid.

Mediastinum/Nodes: No enlarged mediastinal lymph nodes. No evidence
of mediastinal mass.

Lungs/Pleura: There is widespread pulmonary scarring and
emphysematous change. There is a spiculated density in the right
upper lobe measuring up to 18 mm in size. The patient reportedly has
a history of lung cancer in this could be a treated lesion or a
scar. The possibility of viable tumor is not excluded however. There
is 7 mm pulmonary nodule more medially in the right upper lobe that
appears fairly well circumscribed. This is nonspecific for a
pulmonary metastasis, second primary tumor or metastasis. No sign of
consolidation, collapse or effusion.

Upper Abdomen: Negative

Musculoskeletal: Ordinary degenerative changes affect the spine.

Review of the MIP images confirms the above findings.
IMPRESSION: No pulmonary emboli.

Background pattern of emphysema and pulmonary scarring.

18 mm spiculated density in the right upper lobe could be a treated
lung cancer, active lung cancer or second lesion. 7 mm nodule medial
to that in the right upper lobe could be a benign nodule, metastatic
nodule, or second primary.

Coronary artery calcification noted in the left system.

## 2017-12-30 MED ORDER — NYSTATIN 100000 UNIT/ML MT SUSP: 500000.0000 [IU] | Freq: Four times a day (QID) | OROMUCOSAL | 0 refills | Status: AC

## 2017-12-30 MED ORDER — IOPAMIDOL (ISOVUE-370) INJECTION 76%
INTRAVENOUS | Status: AC
  Filled 2017-12-30: qty 100

## 2017-12-30 MED ORDER — IPRATROPIUM-ALBUTEROL 0.5-2.5 (3) MG/3ML IN SOLN
3.0000 mL | Freq: Once | RESPIRATORY_TRACT | Status: AC
  Administered 2017-12-30: 3 mL via RESPIRATORY_TRACT
  Filled 2017-12-30: qty 3

## 2017-12-30 MED ORDER — NYSTATIN 100000 UNIT/ML MT SUSP: 5.0000 mL | Freq: Four times a day (QID) | OROMUCOSAL | 1 refills | Status: DC | PRN

## 2017-12-30 MED ORDER — IOPAMIDOL (ISOVUE-370) INJECTION 76%
100.0000 mL | Freq: Once | INTRAVENOUS | Status: AC | PRN
  Administered 2017-12-30: 100 mL via INTRAVENOUS

## 2017-12-30 MED ORDER — PREDNISONE 10 MG PO TABS: 20.0000 mg | ORAL_TABLET | Freq: Every day | ORAL | 0 refills | Status: AC

## 2017-12-30 MED ORDER — SODIUM CHLORIDE (PF) 0.9 % IJ SOLN
INTRAMUSCULAR | Status: AC
  Filled 2017-12-30: qty 50

## 2017-12-30 MED ORDER — OXYCODONE HCL 10 MG PO TABS: 10.0000 mg | ORAL_TABLET | Freq: Four times a day (QID) | ORAL | 0 refills | Status: AC | PRN

## 2017-12-30 MED ORDER — METHYLPREDNISOLONE SODIUM SUCC 125 MG IJ SOLR
125.0000 mg | Freq: Once | INTRAMUSCULAR | Status: AC
  Administered 2017-12-30: 125 mg via INTRAVENOUS
  Filled 2017-12-30: qty 2

## 2017-12-30 MED ORDER — FENTANYL CITRATE (PF) 100 MCG/2ML IJ SOLN
50.0000 ug | Freq: Once | INTRAMUSCULAR | Status: AC
  Administered 2017-12-30: 50 ug via INTRAVENOUS
  Filled 2017-12-30: qty 2

## 2017-12-30 NOTE — ED Notes (Signed)
Pt in restroom when called for triage

## 2017-12-30 NOTE — ED Notes (Signed)
Pt ambulated on room air.  Baseline O2 sat-97%, HR 103.  Pt ambulated approximately 300 ft with no SHOB, O2 sats decreased to 94% and HR-109.  With rest, O2 sats increased to 96%.  Pt returned to room with no issue.  Will continue to monitor.

## 2017-12-30 NOTE — Discharge Instructions (Addendum)
Follow up with your provider. Return to ER for worsening or concerning symptoms. Take Prednisone as prescribed and complete the full course. Take pain medication as prescribed- generally these medications cannot be refilled in the Er. Please see your provider for additional medication management. Nystatin as prescribed.

## 2017-12-30 NOTE — ED Triage Notes (Signed)
Pt states that she has been having bilateral leg swelling that has not resolved with her normal lasix. Pt states that she had chest pains earlier today, but attributes that to steroids. Pt states that she took 2x her regular dose of lasix, but it didn't help Pt also has lung and brain CA.Pt states she was due to drive to [place] today for scans, but was unable to. Pt states she has been using her nebulizer daily as well since moving to this apartment because she has been wheezing more.

## 2017-12-30 NOTE — ED Provider Notes (Signed)
[facility] DEPT Provider Note   CSN: [id number] Arrival date & time: 12/30/17  1123     History   Chief Complaint Chief Complaint  Patient presents with  . Leg Swelling    HPI Ashley Mayo is a 52 y.o. female with a history of stage IV lung cancer with brain mets, COPD, hypertension, E-cigarette use who presents for chest pain, sob, leg swelling.  Patient reports that she recently moved down from [place].  She reports she is still followed in [place] for her cancer treatments and scans.  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  She reports over the last 1 week she has been having increasing bilateral lower extremity swelling along with intermittent substernal chest pain, and constant shortness of breath is worse with lying down as well as with exertion.  Patient does report a dry cough associated with this.  She has been trying Lasix over the last 3 days her symptoms without any relief.  She reports this is happened before and usually she can take Lasix and it relieves her symptoms.  She reports her chest pain is intermittent, substernal without radiation or associated nausea/diaphoresis.  She is unsure if this is related to exertion.  Her symptoms typically lasts 5-10 minutes before resolving with lying down.  She is unsure what brings them on.  No fever or hemoptysis.  She has been using her nebulizer treatment at home without any relief as well.  She does report a history of MI.  No echo in our system.  Care everywhere shows last echo on 10/20/2013 with a EF of 50-65% and no significant pathology.  HPI  Past Medical History:  Diagnosis Date  . Anxiety   . Asthma   . Cancer ([place])    lung cancer  . COPD (chronic obstructive pulmonary disease) ([place])   . Depression   . GERD (gastroesophageal reflux disease)   . Hypertension   . Migraine     Patient Active Problem List   Diagnosis Date Noted  . Perforated ear drum,  bilateral 01/25/2016  . Pneumonia 01/23/2016  . Healthcare-associated pneumonia 01/23/2016  . Hypokalemia 01/23/2016  . Anxiety 01/23/2016  . Depression 01/23/2016  . Metastatic lung cancer (metastasis from lung to other site) ([place]) 01/23/2016  . Brain metastasis ([place]) 01/23/2016  . Essential hypertension 01/23/2016    Past Surgical History:  Procedure Laterality Date  . ABDOMINAL HYSTERECTOMY    . CESAREAN SECTION    . ORIF right hip fracture       OB History   None      Home Medications    Prior to Admission medications   Medication Sig Start Date End Date Taking? Authorizing Provider  acetaminophen (TYLENOL) 500 MG tablet Take 500-1,000 mg by mouth every 6 (six) hours as needed for mild pain, moderate pain, fever or headache.     [provider]  ADDERALL XR 30 MG 24 hr capsule Take 30 mg by mouth daily.    [provider]  albuterol (PROVENTIL HFA;VENTOLIN HFA) 108 (90 Base) MCG/ACT inhaler Inhale 1-2 puffs into the lungs every 6 (six) hours as needed for wheezing or shortness of breath.    [provider]  albuterol (PROVENTIL) (2.5 MG/3ML) 0.083% nebulizer solution Take 2.5 mg by nebulization every 6 (six) hours as needed for wheezing or shortness of breath.     [provider]  amphetamine-dextroamphetamine (ADDERALL) 10 MG tablet Take 10 mg by mouth every evening.  [provider]  atorvastatin (LIPITOR) 40 MG tablet Take 40 mg by mouth daily. 11/22/17   [provider]  buPROPion (WELLBUTRIN SR) 200 MG 12 hr tablet Take 1 tablet (200 mg total) by mouth 2 (two) times daily. 02/07/16   Domenic Moras, PA-C  diphenhydrAMINE (BENADRYL) 25 MG tablet Take 25 mg by mouth at bedtime as needed for sleep.    [provider]  esomeprazole (NEXIUM) 40 MG capsule Take 40 mg by mouth daily. 12/02/17   [provider]  griseofulvin (GRIS-PEG) 250 MG tablet Take 375 mg by mouth 2 (two) times daily.    [provider]  ketoconazole (NIZORAL) 2 % shampoo Apply 1 application topically daily.    [provider]  levofloxacin (LEVAQUIN) 750 MG tablet Take 1 tablet (750 mg total) by mouth daily. Take for 7 days 05/12/16   Blanchie Dessert, MD  lidocaine (LIDODERM) 5 % Place 1-2 patches onto the skin daily as needed (for back pain).     [provider]  LORazepam (ATIVAN) 0.5 MG tablet Take 0.5 mg by mouth every 6 (six) hours as needed for anxiety.    [provider]  metoprolol succinate (TOPROL-XL) 25 MG 24 hr tablet Take 25 mg by mouth daily.    [provider]  metoprolol succinate (TOPROL-XL) 25 MG 24 hr tablet Take 25 mg by mouth daily. 11/22/17   [provider]  nicotine (NICODERM CQ - DOSED IN MG/24 HOURS) 14 mg/24hr patch Place 1 patch onto the skin See admin instructions. Place 1 patch onto the skin daily. Alternate arms/sites when applying patch. 52 old patch when applying new one. 11/22/17   [provider]  nystatin (MYCOSTATIN) 100000 UNIT/ML suspension Take 5 mLs by mouth 4 (four) times daily as needed (for thrush).     [provider]  nystatin (NYSTATIN) powder Apply 1 g topically 4 (four) times daily as needed (for rash).    [provider]  oxyCODONE (OXYCONTIN) 10 mg 12 hr tablet Take 10 mg by mouth every 12 (twelve) hours.    [provider]  Oxycodone HCl 10 MG TABS Take 10 mg by mouth every 6 (six) hours as needed. 12/07/17   [provider]  oxyCODONE-acetaminophen (PERCOCET) 10-325 MG tablet Take 1 tablet by mouth every 6 (six) hours as needed. 09/24/17   [provider]  oxyCODONE-acetaminophen (PERCOCET/ROXICET) 5-325 MG tablet Take 1 tablet by mouth every 4 (four) hours as needed for severe pain.    [provider]  OXYCONTIN 20 MG 12 hr tablet Take 20 mg by mouth every 8 (eight) hours. 12/20/17   [provider]  pantoprazole (PROTONIX) 40 MG tablet Take 40 mg  by mouth daily.    [provider]  pembrolizumab (KEYTRUDA) 100 MG/4ML SOLN Inject 2 mg/kg into the vein every 21 ( twenty-one) days.    [provider]  phenazopyridine (PYRIDIUM) 200 MG tablet Take 1 tablet (200 mg total) by mouth 3 (three) times daily. 05/12/16   Blanchie Dessert, MD  predniSONE (DELTASONE) 10 MG tablet Take 20 mg by mouth daily. 12/24/17   [provider]  prochlorperazine (COMPAZINE) 10 MG tablet Take 10 mg by mouth every 6 (six) hours as needed for nausea or vomiting.    [provider]  SUMAtriptan (IMITREX) 6 MG/0.5ML SOLN injection Inject 0.5 mLs (6 mg total) into the skin every 2 (two) hours as needed for migraine or headache. May repeat in 2 hours if headache persists  or recurs. 05/12/16   Blanchie Dessert, MD  valACYclovir (VALTREX) 500 MG tablet Take 500 mg by mouth 2 (two) times daily as needed (for cold sores).     [provider]    Family History Family History  Problem Relation Age of Onset  . Breast cancer Mother     Social History Social History   Tobacco Use  . Smoking status: Former Smoker    Packs/day: 0.25    Years: 35.00    Pack years: 8.75    Types: Cigarettes  . Smokeless tobacco: Never Used  Substance Use Topics  . Alcohol use: No  . Drug use: No     Allergies   Codeine and Shellfish allergy   Review of Systems Review of Systems  All other systems reviewed and are negative.    Physical Exam Updated Vital Signs BP (!) 138/109   Pulse 87   Temp 98.1 F (36.7 C) (Oral)   Resp 15   Ht 5\' 4"  (1.626 m)   Wt 78.9 kg   SpO2 95%   BMI 29.87 kg/m   Physical Exam  Constitutional: She appears well-developed and well-nourished.  HENT:  Head: Normocephalic and atraumatic.  Right Ear: External ear normal.  Left Ear: External ear normal.  Nose: Nose normal.  Mouth/Throat: Uvula is midline, oropharynx is clear and moist and mucous membranes are normal. No tonsillar exudate.  Eyes:  Pupils are equal, round, and reactive to light. Right eye exhibits no discharge. Left eye exhibits no discharge. No scleral icterus.  Neck: Trachea normal. Neck supple. No spinous process tenderness present. No neck rigidity. Normal range of motion present.  Cardiovascular: Normal rate, regular rhythm and intact distal pulses.  No murmur heard. Pulses:      Radial pulses are 2+ on the right side, and 2+ on the left side.       Dorsalis pedis pulses are 2+ on the right side, and 2+ on the left side.       Posterior tibial pulses are 2+ on the right side, and 2+ on the left side.  2+ b/l lower extremity edema.   Pulmonary/Chest: Effort normal. No accessory muscle usage. No apnea, no tachypnea and no bradypnea. No respiratory distress. She has decreased breath sounds. She has wheezes. She exhibits no tenderness.  Abdominal: Soft. Bowel sounds are normal. There is no tenderness. There is no rebound and no guarding.  Musculoskeletal: She exhibits no edema.  Lymphadenopathy:    She has no cervical adenopathy.  Neurological: She is alert.  Skin: Skin is warm and dry. No rash noted. She is not diaphoretic.  Psychiatric: She has a normal mood and affect.  Nursing note and vitals reviewed.    ED Treatments / Results  Labs (all labs ordered are listed, but only abnormal results are displayed) Labs Reviewed  BASIC METABOLIC PANEL - Abnormal; Notable for the following components:      Result Value   Glucose, Bld 150 (*)    BUN 32 (*)    Creatinine, Ser 1.07 (*)    GFR calc non Af Amer 59 (*)    All other components within normal limits  CBC - Abnormal; Notable for the following components:   RDW 18.3 (*)    All other components within normal limits  DIFFERENTIAL - Abnormal; Notable for the following components:   Neutro Abs 8.4 (*)    All other components within normal limits  BRAIN NATRIURETIC PEPTIDE  I-STAT TROPONIN, ED  I-STAT BETA HCG  BLOOD, ED [facility], [facility], [facility] ONLY)    EKG EKG  Interpretation  Date/Time:  Thursday December 30 2017 11:39:09 EST Ventricular Rate:  91 PR Interval:    QRS Duration: 88 QT Interval:  337 QTC Calculation: 415 R Axis:   45 Text Interpretation:  Sinus rhythm Probable left atrial enlargement borderline repolarization abnormality RESOLVED SINCE PREVIOUS Confirmed by Blanchie Dessert [phone]) on 12/30/2017 12:47:13 PM   Radiology Dg Chest 2 View  Result Date: 12/30/2017 CLINICAL DATA:  RIGHT chest pain. EXAM: CHEST - 2 VIEW COMPARISON:  05/12/2016. FINDINGS: The heart is enlarged. Mild vascular congestion. No consolidation or edema. No effusion or pneumothorax. Bones demonstrate osteopenia. IMPRESSION: Cardiomegaly.  No active disease.  Similar appearance to priors. Electronically Signed   By: Staci Righter M.D.   On: 12/30/2017 12:29   Ct Angio Chest Pe W/cm &/or Wo Cm  Result Date: 12/30/2017 CLINICAL DATA:  Bilateral leg swelling not relieved by diuretics. History of lung and brain cancer. EXAM: CT ANGIOGRAPHY CHEST WITH CONTRAST TECHNIQUE: Multidetector CT imaging of the chest was performed using the standard protocol during bolus administration of intravenous contrast. Multiplanar CT image reconstructions and MIPs were obtained to evaluate the vascular anatomy. CONTRAST:  156mL ISOVUE-370 IOPAMIDOL (ISOVUE-370) INJECTION 76% COMPARISON:  Chest radiography same day. FINDINGS: Cardiovascular: Pulmonary arterial opacification is excellent. There are no pulmonary emboli. Heart size is normal. There is coronary artery calcification. No pericardial fluid. Mediastinum/Nodes: No enlarged mediastinal lymph nodes. No evidence of mediastinal mass. Lungs/Pleura: There is widespread pulmonary scarring and emphysematous change. There is a spiculated density in the right upper lobe measuring up to 18 mm in size. The patient reportedly has a history of lung cancer in this could be a treated lesion or a scar. The possibility of viable tumor is not excluded  however. There is 7 mm pulmonary nodule more medially in the right upper lobe that appears fairly well circumscribed. This is nonspecific for a pulmonary metastasis, second primary tumor or metastasis. No sign of consolidation, collapse or effusion. Upper Abdomen: Negative Musculoskeletal: Ordinary degenerative changes affect the spine. Review of the MIP images confirms the above findings. IMPRESSION: No pulmonary emboli. Background pattern of emphysema and pulmonary scarring. 18 mm spiculated density in the right upper lobe could be a treated lung cancer, active lung cancer or second lesion. 7 mm nodule medial to that in the right upper lobe could be a benign nodule, metastatic nodule, or second primary. Coronary artery calcification noted in the left system. Electronically Signed   By: Nelson Chimes M.D.   On: 12/30/2017 14:36   Vas [place] Lower Extremity Venous (dvt) (mc And Wl 7a-7p)  Result Date: 12/30/2017  Lower Venous Study Indications: Edema.  Performing Technologist: Abram Sander  Examination Guidelines: A complete evaluation includes B-mode imaging, spectral Doppler, color Doppler, and power Doppler as needed of all accessible portions of each vessel. Bilateral testing is considered an integral part of a complete examination. Limited examinations for reoccurring indications may be performed as noted.  Right Venous Findings: +---------+---------------+---------+-----------+----------+-------+          CompressibilityPhasicitySpontaneityPropertiesSummary +---------+---------------+---------+-----------+----------+-------+ CFV      Full           Yes      Yes                          +---------+---------------+---------+-----------+----------+-------+ SFJ      Full                                                 +---------+---------------+---------+-----------+----------+-------+  FV Prox  Full                                                  +---------+---------------+---------+-----------+----------+-------+ FV Mid   Full                                                 +---------+---------------+---------+-----------+----------+-------+ FV DistalFull                                                 +---------+---------------+---------+-----------+----------+-------+ PFV      Full                                                 +---------+---------------+---------+-----------+----------+-------+ POP      Full           Yes      Yes                          +---------+---------------+---------+-----------+----------+-------+ PTV      Full                                                 +---------+---------------+---------+-----------+----------+-------+ PERO     Full                                                 +---------+---------------+---------+-----------+----------+-------+  Left Venous Findings: +---------+---------------+---------+-----------+----------+-------+          CompressibilityPhasicitySpontaneityPropertiesSummary +---------+---------------+---------+-----------+----------+-------+ CFV      Full           Yes      Yes                          +---------+---------------+---------+-----------+----------+-------+ SFJ      Full                                                 +---------+---------------+---------+-----------+----------+-------+ FV Prox  Full                                                 +---------+---------------+---------+-----------+----------+-------+ FV Mid   Full                                                 +---------+---------------+---------+-----------+----------+-------+  FV DistalFull                                                 +---------+---------------+---------+-----------+----------+-------+ PFV      Full                                                  +---------+---------------+---------+-----------+----------+-------+ POP      Full           Yes      Yes                          +---------+---------------+---------+-----------+----------+-------+ PTV      Full                                                 +---------+---------------+---------+-----------+----------+-------+ PERO     Full                                                 +---------+---------------+---------+-----------+----------+-------+    Summary: Right: There is no evidence of deep vein thrombosis in the lower extremity. No cystic structure found in the popliteal fossa. Left: There is no evidence of deep vein thrombosis in the lower extremity. No cystic structure found in the popliteal fossa.  *See table(s) above for measurements and observations. Electronically signed by Servando Snare MD on 12/30/2017 at 1:56:41 PM.    Final     Procedures Procedures (including critical care time)  Medications Ordered in ED Medications  sodium chloride (PF) 0.9 % injection (has no administration in time range)  iopamidol (ISOVUE-370) 76 % injection (has no administration in time range)  ipratropium-albuterol (DUONEB) 0.5-2.5 (3) MG/3ML nebulizer solution 3 mL (has no administration in time range)  methylPREDNISolone sodium succinate (SOLU-MEDROL) 125 mg/2 mL injection 125 mg (has no administration in time range)  ipratropium-albuterol (DUONEB) 0.5-2.5 (3) MG/3ML nebulizer solution 3 mL (3 mLs Nebulization Given 12/30/17 1300)  iopamidol (ISOVUE-370) 76 % injection 100 mL (100 mLs Intravenous Contrast Given 12/30/17 1350)     Initial Impression / Assessment and Plan / ED Course  I have reviewed the triage vital signs and the nursing notes.  Pertinent labs & imaging results that were available during my care of the patient were reviewed by me and considered in my medical decision making (see chart for details).     52 y.o. female with a history of stage IV lung cancer  with brain mets, COPD, hypertension, E-cigarette use who presents for chest pain, sob, cough and leg swelling.  Patient's chest pain is nonexertional in nature.  She reports her shortness of breath is worse with exertion and also with lying down.  Patient reports she has tried Lasix for symptoms without any relief.  She denies history of CHF. No echo in our system.  Care everywhere shows last echo on 10/20/2013 with a EF of 50-65% and no significant pathology.  Patient is without fever,  tachycardia, tachypnea or hypoxia on presentation.  No JVD on exam.  Patient does have decreased breath sounds throughout with noted inspiratory and expiratory wheezing on auscultation.  Patient is also a 2+ lower extremity edema bilaterally.  Will obtain lab work, lower extremity ultrasounds, chest x-ray, CTA.  Will also give DuoNeb therapy and steroids.  Differential includes COPD exacerbation, new onset CHF, pneumonia, PE.   Blood work is reassuring.  No leukocytosis.  Patient does have mildly elevated creatinine however we do not have other results prior to 2018.  EKG without evidence of STEMI.  No ST-T changes.  Troponin within normal limits.  Chest x-ray with cardiomegaly but without any acute disease.  No evidence of pleural effusion, pneumothorax, infiltrate, vascular consolidation or edema.  Lower extremity ultrasounds are negative.  CTA without evidence of PE, pneumonia or CHF.  Will treat the patient has a COPD exacerbation.  Patient does report improvement after DuoNeb.  Will give second. Case signed out to Suella Broad, PA-C. Will plan to re-eval. If improved, can ambulate without hypoxia, can be discharged with steroid burst and pcp follow up.   Patient case discussed with Dr. Maryan Rued who is in agreement with plan.  Final Clinical Impressions(s) / ED Diagnoses   Final diagnoses:  Peripheral edema  SOB (shortness of breath)    ED Discharge Orders    None       Lorelle Gibbs 12/30/17 1627      Blanchie Dessert, MD 12/30/17 2100

## 2017-12-30 NOTE — Progress Notes (Signed)
*  Preliminary Results* Bilateral lower extremity venous duplex completed. Bilateral lower extremities are negative for deep vein thrombosis. There is no evidence of Baker's cyst bilaterally.  12/30/2017 12:55 PM Ender Rorke Dawna Part

## 2017-12-30 NOTE — ED Provider Notes (Signed)
Assumed care of patient at shift change from Orlinda Blalock, see his note for complete history and physical. Briefly- hx stage 4 lung ca with brain mets, goes to CT once monthly for treatment. 1 week lower extremity swelling, SHOB, cough (productive), chest pain. CTA neg for PE/fluid. Venous doppler bilateral extremities neg for DVT. Plan is to treat COPD exacerbation, ambulate after neb and if O2 sats WNL may dc home. Dc home on 5d steroid pack.  Physical Exam  BP (!) 150/102 (BP Location: Left Arm)   Pulse 100   Temp 98.1 F (36.7 C) (Oral)   Resp 18   Ht 5\' 4"  (1.626 m)   Wt 78.9 kg   SpO2 100%   BMI 29.87 kg/m   Physical Exam  ED Course/Procedures     Procedures  MDM  Patient is feeling better and ready for dc home. Review of controlled substance database, patient regularly fills pain medication rx from her out of state provider, states she is going to be out of medication x 1 week due to a later than normal appointment. Advised patient this type of medication can not routinely be refilled through the ER, 5 days of medication given today. Also requests rx for Nystatin. Rx for prednisone given for COPD exacerbation.        Tacy Learn, PA-C 12/30/17 Arnoldo Lenis    Blanchie Dessert, MD 01/04/18 [phone]

## 2019-01-25 ENCOUNTER — Other Ambulatory Visit: Payer: Self-pay

## 2019-01-25 ENCOUNTER — Inpatient Hospital Stay (HOSPITAL_COMMUNITY)
Admission: EM | Admit: 2019-01-25 | Discharge: 2019-01-26 | DRG: 640 | Disposition: A | Discharge status: Home / Self Care | Payer: Medicaid Other | Attending: Internal Medicine | Admitting: Internal Medicine

## 2019-01-25 ENCOUNTER — Encounter (HOSPITAL_COMMUNITY): Payer: Self-pay | Admitting: Emergency Medicine

## 2019-01-25 ENCOUNTER — Emergency Department (HOSPITAL_COMMUNITY): Payer: Medicaid Other

## 2019-01-25 DIAGNOSIS — Z885 Allergy status to narcotic agent status: Secondary | ICD-10-CM

## 2019-01-25 DIAGNOSIS — J189 Pneumonia, unspecified organism: Secondary | ICD-10-CM | POA: Diagnosis present

## 2019-01-25 DIAGNOSIS — C3490 Malignant neoplasm of unspecified part of unspecified bronchus or lung: Secondary | ICD-10-CM | POA: Diagnosis present

## 2019-01-25 DIAGNOSIS — T451X5A Adverse effect of antineoplastic and immunosuppressive drugs, initial encounter: Secondary | ICD-10-CM | POA: Diagnosis present

## 2019-01-25 DIAGNOSIS — K219 Gastro-esophageal reflux disease without esophagitis: Secondary | ICD-10-CM | POA: Diagnosis present

## 2019-01-25 DIAGNOSIS — X58XXXA Exposure to other specified factors, initial encounter: Secondary | ICD-10-CM | POA: Diagnosis present

## 2019-01-25 DIAGNOSIS — G43909 Migraine, unspecified, not intractable, without status migrainosus: Secondary | ICD-10-CM | POA: Diagnosis present

## 2019-01-25 DIAGNOSIS — F419 Anxiety disorder, unspecified: Secondary | ICD-10-CM | POA: Diagnosis present

## 2019-01-25 DIAGNOSIS — Y929 Unspecified place or not applicable: Secondary | ICD-10-CM | POA: Diagnosis not present

## 2019-01-25 DIAGNOSIS — D72819 Decreased white blood cell count, unspecified: Secondary | ICD-10-CM | POA: Diagnosis present

## 2019-01-25 DIAGNOSIS — I10 Essential (primary) hypertension: Secondary | ICD-10-CM | POA: Diagnosis present

## 2019-01-25 DIAGNOSIS — R251 Tremor, unspecified: Secondary | ICD-10-CM | POA: Diagnosis present

## 2019-01-25 DIAGNOSIS — Z79891 Long term (current) use of opiate analgesic: Secondary | ICD-10-CM | POA: Diagnosis not present

## 2019-01-25 DIAGNOSIS — Z91013 Allergy to seafood: Secondary | ICD-10-CM | POA: Diagnosis not present

## 2019-01-25 DIAGNOSIS — D61810 Antineoplastic chemotherapy induced pancytopenia: Secondary | ICD-10-CM | POA: Diagnosis present

## 2019-01-25 DIAGNOSIS — Z6822 Body mass index (BMI) 22.0-22.9, adult: Secondary | ICD-10-CM

## 2019-01-25 DIAGNOSIS — C799 Secondary malignant neoplasm of unspecified site: Secondary | ICD-10-CM | POA: Diagnosis present

## 2019-01-25 DIAGNOSIS — R040 Epistaxis: Secondary | ICD-10-CM | POA: Diagnosis present

## 2019-01-25 DIAGNOSIS — J449 Chronic obstructive pulmonary disease, unspecified: Secondary | ICD-10-CM | POA: Diagnosis present

## 2019-01-25 DIAGNOSIS — Z20828 Contact with and (suspected) exposure to other viral communicable diseases: Secondary | ICD-10-CM | POA: Diagnosis present

## 2019-01-25 DIAGNOSIS — D649 Anemia, unspecified: Secondary | ICD-10-CM | POA: Diagnosis present

## 2019-01-25 DIAGNOSIS — R64 Cachexia: Secondary | ICD-10-CM | POA: Diagnosis present

## 2019-01-25 DIAGNOSIS — E860 Dehydration: Secondary | ICD-10-CM | POA: Diagnosis present

## 2019-01-25 DIAGNOSIS — D696 Thrombocytopenia, unspecified: Secondary | ICD-10-CM | POA: Diagnosis present

## 2019-01-25 DIAGNOSIS — Z90710 Acquired absence of both cervix and uterus: Secondary | ICD-10-CM

## 2019-01-25 DIAGNOSIS — E876 Hypokalemia: Secondary | ICD-10-CM | POA: Diagnosis present

## 2019-01-25 DIAGNOSIS — F329 Major depressive disorder, single episode, unspecified: Secondary | ICD-10-CM | POA: Diagnosis present

## 2019-01-25 DIAGNOSIS — F17210 Nicotine dependence, cigarettes, uncomplicated: Secondary | ICD-10-CM | POA: Diagnosis present

## 2019-01-25 DIAGNOSIS — Z79899 Other long term (current) drug therapy: Secondary | ICD-10-CM

## 2019-01-25 DIAGNOSIS — D630 Anemia in neoplastic disease: Secondary | ICD-10-CM | POA: Diagnosis present

## 2019-01-25 DIAGNOSIS — Z716 Tobacco abuse counseling: Secondary | ICD-10-CM

## 2019-01-25 DIAGNOSIS — R918 Other nonspecific abnormal finding of lung field: Secondary | ICD-10-CM | POA: Diagnosis present

## 2019-01-25 LAB — COMPREHENSIVE METABOLIC PANEL
ALT: 129 U/L — ABNORMAL HIGH (ref 0–44)
ALT: 133 U/L — ABNORMAL HIGH (ref 0–44)
AST: 87 U/L — ABNORMAL HIGH (ref 15–41)
AST: 89 U/L — ABNORMAL HIGH (ref 15–41)
Albumin: 3.2 g/dL — ABNORMAL LOW (ref 3.5–5.0)
Albumin: 3.5 g/dL (ref 3.5–5.0)
Alkaline Phosphatase: 128 U/L — ABNORMAL HIGH (ref 38–126)
Alkaline Phosphatase: 123 U/L (ref 38–126)
Anion gap: 12 (ref 5–15)
Anion gap: 9 (ref 5–15)
BUN: 19 mg/dL (ref 6–20)
BUN: 16 mg/dL (ref 6–20)
CO2: 23 mmol/L (ref 22–32)
CO2: 27 mmol/L (ref 22–32)
Calcium: 6.2 mg/dL — CL (ref 8.9–10.3)
Calcium: 6.9 mg/dL — ABNORMAL LOW (ref 8.9–10.3)
Chloride: 101 mmol/L (ref 98–111)
Chloride: 103 mmol/L (ref 98–111)
Creatinine, Ser: 0.66 mg/dL (ref 0.44–1.00)
Creatinine, Ser: 0.73 mg/dL (ref 0.44–1.00)
GFR calc Af Amer: >60 mL/min (ref >60)
GFR calc Af Amer: >60 mL/min (ref >60)
GFR calc non Af Amer: >60 mL/min (ref >60)
GFR calc non Af Amer: >60 mL/min (ref >60)
Glucose, Bld: 89 mg/dL (ref 70–99)
Glucose, Bld: 81 mg/dL (ref 70–99)
Potassium: 3.6 mmol/L (ref 3.5–5.1)
Potassium: 3 mmol/L — ABNORMAL LOW (ref 3.5–5.1)
Sodium: 136 mmol/L (ref 135–145)
Sodium: 139 mmol/L (ref 135–145)
Total Bilirubin: 0.4 mg/dL (ref 0.3–1.2)
Total Bilirubin: 0.5 mg/dL (ref 0.3–1.2)
Total Protein: 6.1 g/dL — ABNORMAL LOW (ref 6.5–8.1)
Total Protein: 6.4 g/dL — ABNORMAL LOW (ref 6.5–8.1)

## 2019-01-25 LAB — CBC WITH DIFFERENTIAL/PLATELET
Abs Immature Granulocytes: 0 10*3/uL (ref 0.00–0.07)
Basophils Absolute: 0 10*3/uL (ref 0.0–0.1)
Basophils Relative: 0 %
Eosinophils Absolute: 0 10*3/uL (ref 0.0–0.5)
Eosinophils Relative: 0 %
HCT: 18.9 % — ABNORMAL LOW (ref 36.0–46.0)
Hemoglobin: 6.2 g/dL — CL (ref 12.0–15.0)
Immature Granulocytes: 0 %
Lymphocytes Relative: 45 %
Lymphs Abs: 0.8 10*3/uL (ref 0.7–4.0)
MCH: 35 pg — ABNORMAL HIGH (ref 26.0–34.0)
MCHC: 32.8 g/dL (ref 30.0–36.0)
MCV: 106.8 fL — ABNORMAL HIGH (ref 80.0–100.0)
Monocytes Absolute: 0.2 10*3/uL (ref 0.1–1.0)
Monocytes Relative: 12 %
Neutro Abs: 0.8 10*3/uL — ABNORMAL LOW (ref 1.7–7.7)
Neutrophils Relative %: 43 %
Platelets: 6 10*3/uL — CL (ref 150–400)
RBC: 1.77 MIL/uL — ABNORMAL LOW (ref 3.87–5.11)
RDW: 20 % — ABNORMAL HIGH (ref 11.5–15.5)
WBC: 1.8 10*3/uL — ABNORMAL LOW (ref 4.0–10.5)
nRBC: 1.1 % — ABNORMAL HIGH (ref 0.0–0.2)

## 2019-01-25 LAB — MAGNESIUM
Magnesium: 0.4 mg/dL — CL (ref 1.7–2.4)
Magnesium: 2.1 mg/dL (ref 1.7–2.4)

## 2019-01-25 LAB — HIV ANTIBODY (ROUTINE TESTING W REFLEX): HIV Screen 4th Generation wRfx: NONREACTIVE

## 2019-01-25 LAB — PREPARE RBC (CROSSMATCH)

## 2019-01-25 LAB — ABO/RH: ABO/RH(D): O POS

## 2019-01-25 IMAGING — CR DG CHEST 2V
2 series · 2 of 2 positions shown · non-contrast
Comparison: [DATE] chest radiograph and chest CT

CLINICAL DATA: Wheezing

EXAM:
CHEST - 2 VIEW

[w chest lat]
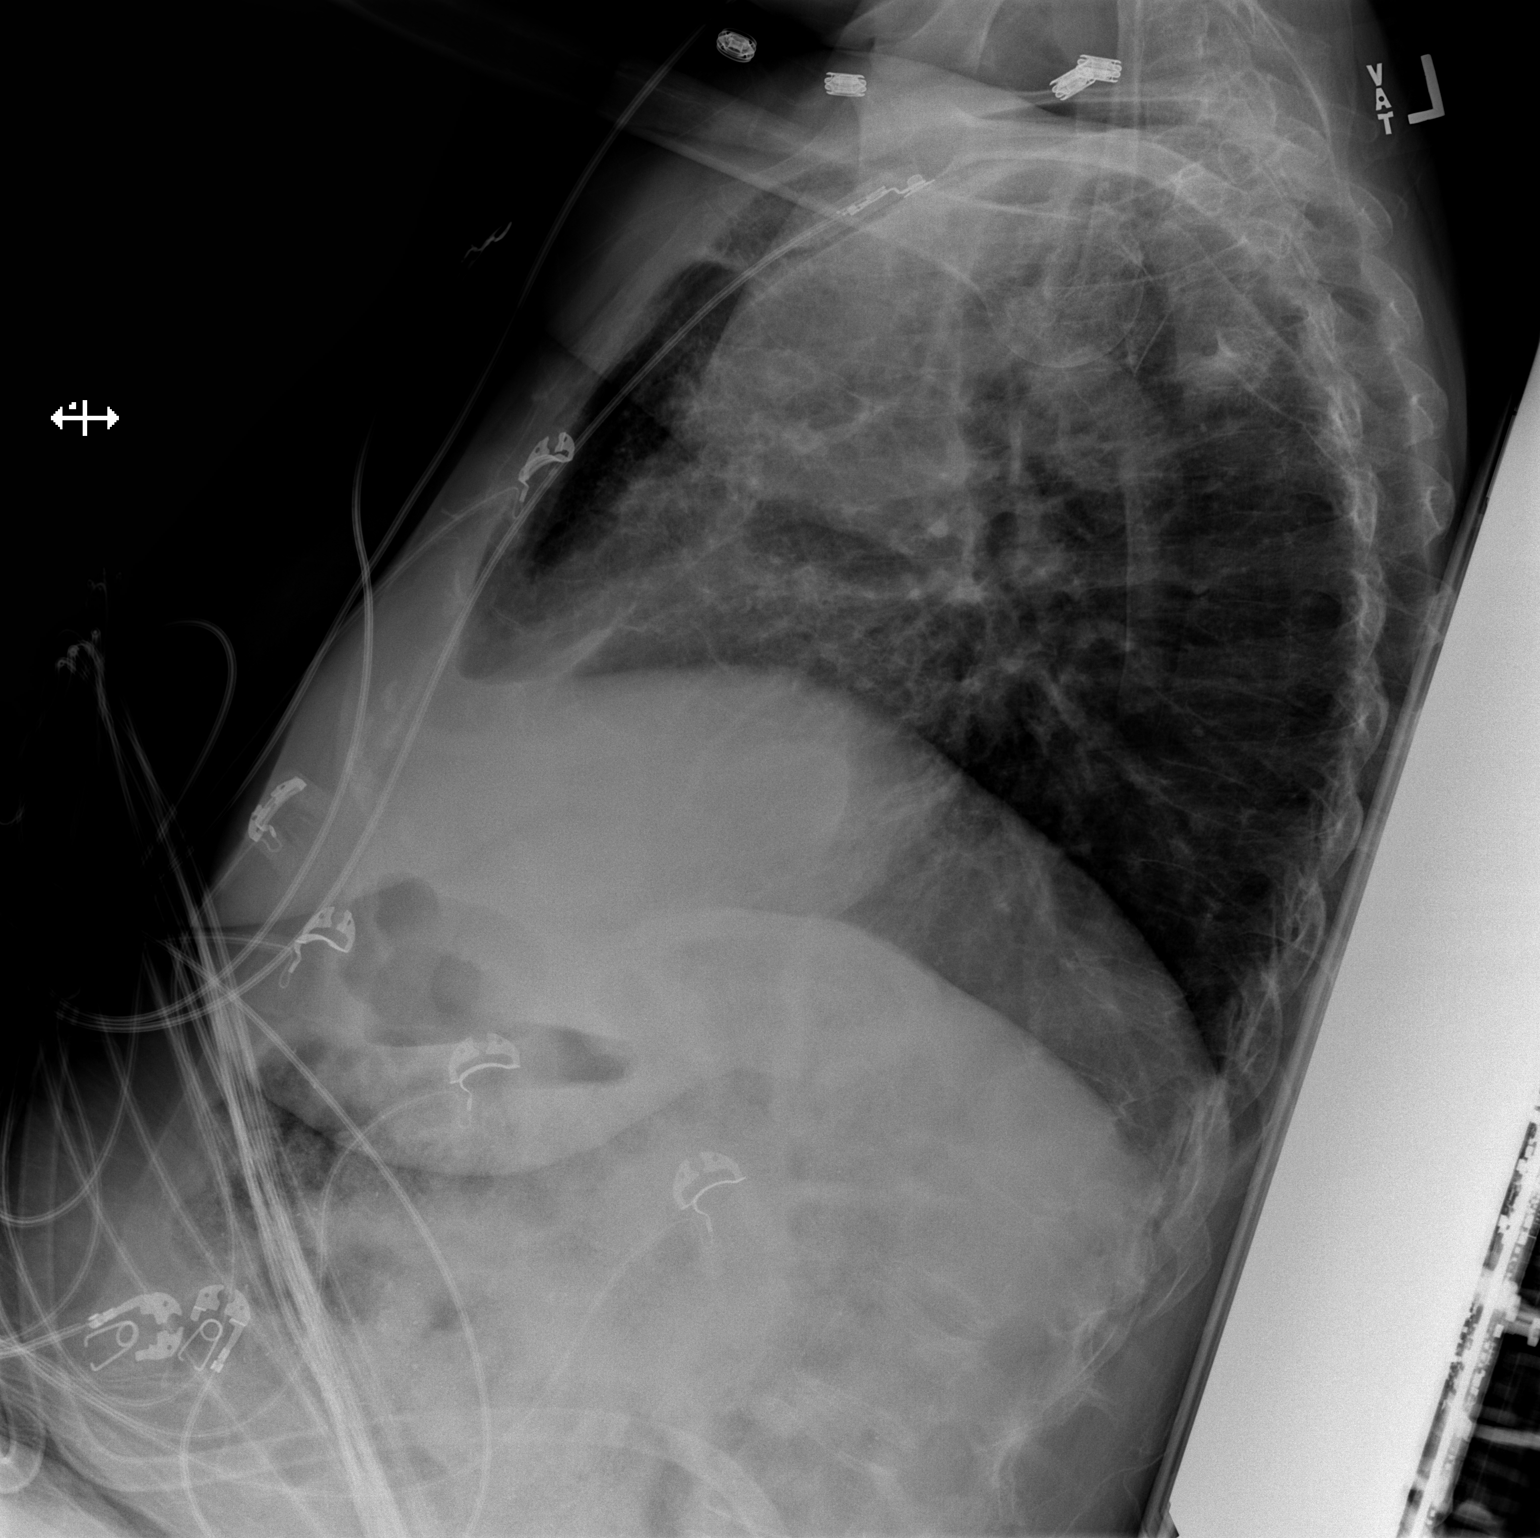

[x chest ap]
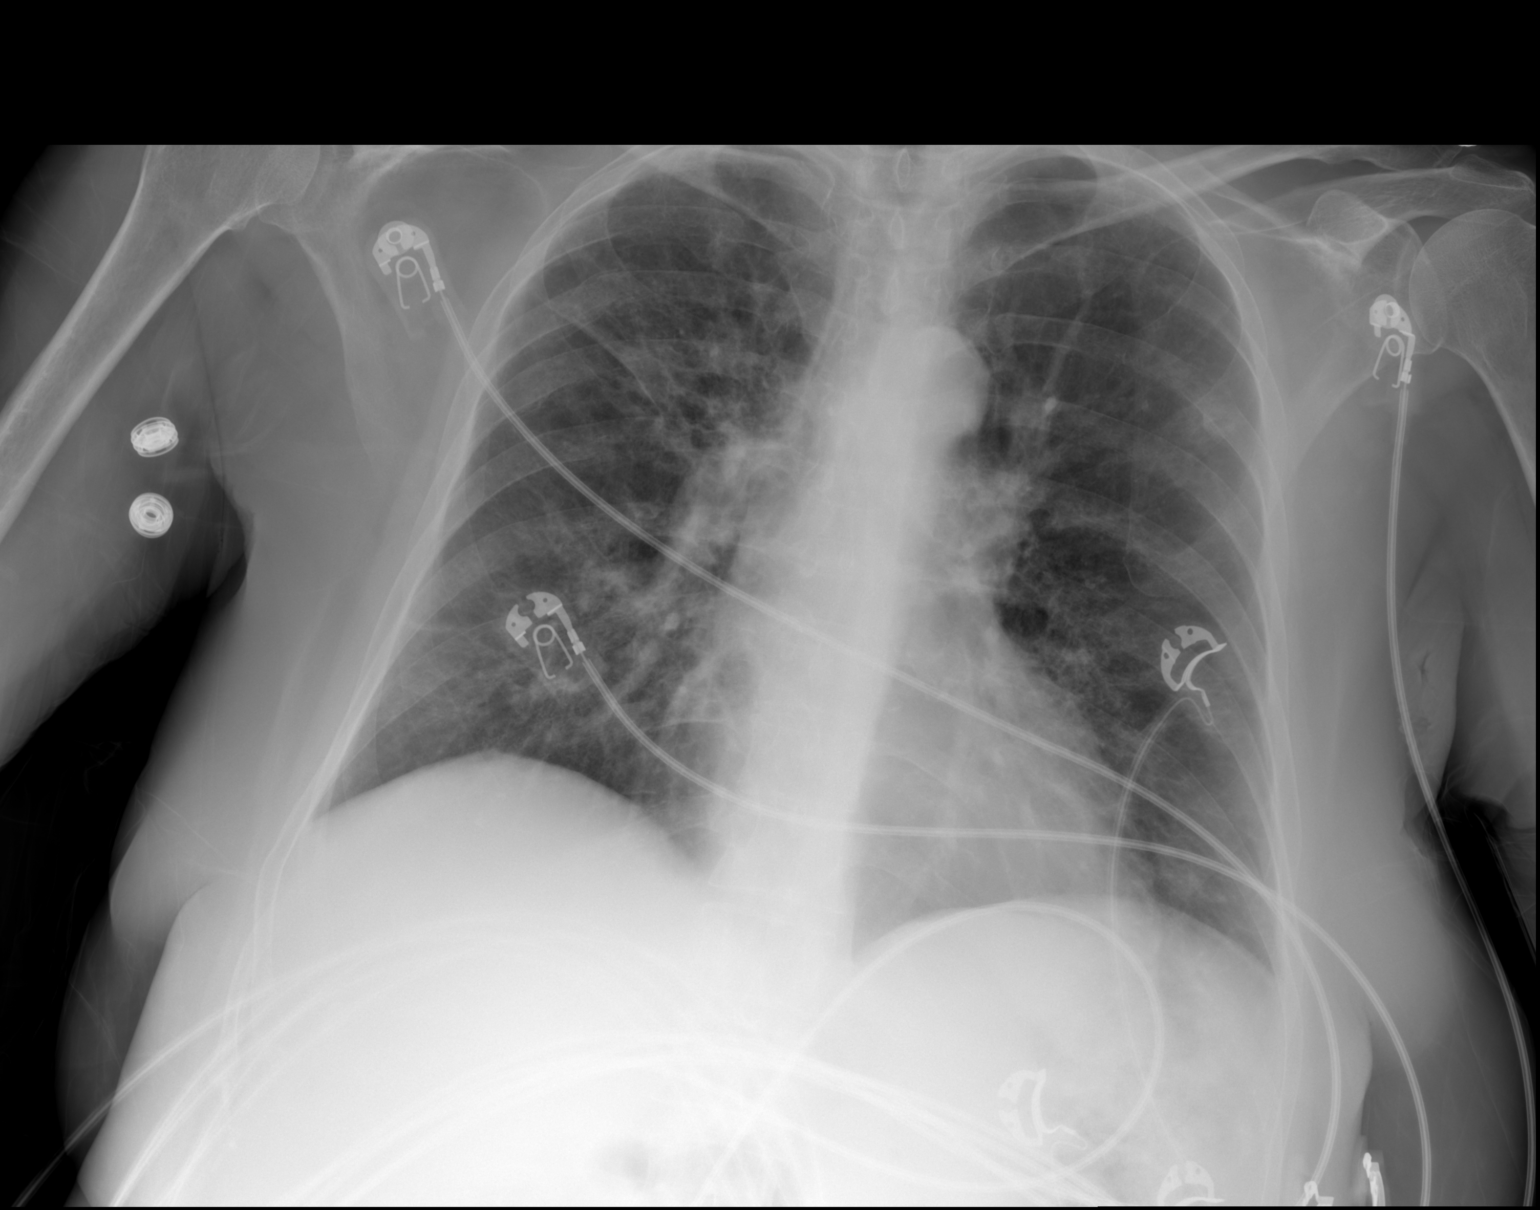

[2 of 2 positions shown; findings below may reference images not displayed]

FINDINGS: There is ill-defined opacity in the right mid and upper lung
regions. There is subtle airspace opacity in the inferior left hilar
region as well. The somewhat spiculated area seen previously in the
right upper lobe is not appreciated as a discrete structure
currently. Note that there is patchy airspace opacity overlying this
area currently. There is a degree of underlying fibrotic change,
better appreciated on prior CT. Heart size and pulmonary vascularity
are normal. No adenopathy. No bone lesions.
IMPRESSION: Areas of airspace opacity in the right upper lobe, right mid lung,
and inferior left perihilar regions. Suspect multifocal pneumonia.
No well-defined mass or adenopathy. Note that the apparent
spiculated area in the right upper lobe seen previously on CT is not
appreciable as a discrete structure by radiography. Given the
appearance of this area on prior CT, repeat CT at this time to
assess for stability is warranted. There is a degree of underlying
fibrotic change, better seen on prior CT.

Cardiac silhouette within normal limits.  No adenopathy evident.

## 2019-01-25 MED ORDER — AMPHETAMINE-DEXTROAMPHET ER 10 MG PO CP24
30.0000 mg | ORAL_CAPSULE | Freq: Every day | ORAL | Status: DC
  Administered 2019-01-26: 30 mg via ORAL
  Filled 2019-01-25: qty 3

## 2019-01-25 MED ORDER — POTASSIUM CHLORIDE IN NACL 40-0.9 MEQ/L-% IV SOLN
INTRAVENOUS | Status: DC
  Administered 2019-01-26: 100 mL/h via INTRAVENOUS
  Filled 2019-01-25 (×2): qty 1000

## 2019-01-25 MED ORDER — ALBUTEROL SULFATE HFA 108 (90 BASE) MCG/ACT IN AERS: 1.0000 | INHALATION_SPRAY | Freq: Four times a day (QID) | RESPIRATORY_TRACT | Status: DC | PRN

## 2019-01-25 MED ORDER — OLANZAPINE 5 MG PO TABS
5.0000 mg | ORAL_TABLET | Freq: Two times a day (BID) | ORAL | Status: DC
  Administered 2019-01-25 – 2019-01-26 (×2): 5 mg via ORAL
  Filled 2019-01-25 (×3): qty 1

## 2019-01-25 MED ORDER — MAGNESIUM SULFATE 2 GM/50ML IV SOLN
2.0000 g | Freq: Once | INTRAVENOUS | Status: AC
  Administered 2019-01-25: 2 g via INTRAVENOUS
  Filled 2019-01-25: qty 50

## 2019-01-25 MED ORDER — CALCIUM GLUCONATE-NACL 1-0.675 GM/50ML-% IV SOLN
1.0000 g | Freq: Once | INTRAVENOUS | Status: AC
  Administered 2019-01-25: 15:00:00 1000 mg via INTRAVENOUS
  Filled 2019-01-25: qty 50

## 2019-01-25 MED ORDER — SODIUM CHLORIDE 0.9% IV SOLUTION: Freq: Once | INTRAVENOUS | Status: DC

## 2019-01-25 MED ORDER — SUMATRIPTAN SUCCINATE 6 MG/0.5ML ~~LOC~~ SOLN
6.0000 mg | SUBCUTANEOUS | Status: DC | PRN
  Administered 2019-01-25: 6 mg via SUBCUTANEOUS
  Filled 2019-01-25 (×3): qty 0.5

## 2019-01-25 MED ORDER — ALBUTEROL SULFATE (2.5 MG/3ML) 0.083% IN NEBU
2.5000 mg | INHALATION_SOLUTION | Freq: Four times a day (QID) | RESPIRATORY_TRACT | Status: DC | PRN
  Administered 2019-01-25: 2.5 mg via RESPIRATORY_TRACT
  Filled 2019-01-25: qty 3

## 2019-01-25 MED ORDER — MORPHINE SULFATE ER 30 MG PO TBCR
30.0000 mg | EXTENDED_RELEASE_TABLET | Freq: Two times a day (BID) | ORAL | Status: DC
  Administered 2019-01-25 – 2019-01-26 (×3): 30 mg via ORAL
  Filled 2019-01-25: qty 1
  Filled 2019-01-25: qty 2
  Filled 2019-01-25: qty 1

## 2019-01-25 MED ORDER — ONDANSETRON HCL 4 MG PO TABS: 8.0000 mg | ORAL_TABLET | Freq: Three times a day (TID) | ORAL | Status: DC | PRN

## 2019-01-25 MED ORDER — DIAZEPAM 5 MG PO TABS
5.0000 mg | ORAL_TABLET | Freq: Once | ORAL | Status: AC
  Administered 2019-01-25: 5 mg via ORAL
  Filled 2019-01-25: qty 1

## 2019-01-25 MED ORDER — AMPHETAMINE-DEXTROAMPHETAMINE 10 MG PO TABS: 10.0000 mg | ORAL_TABLET | Freq: Every evening | ORAL | Status: DC

## 2019-01-25 MED ORDER — FENTANYL CITRATE (PF) 100 MCG/2ML IJ SOLN
50.0000 ug | Freq: Once | INTRAMUSCULAR | Status: AC
  Administered 2019-01-25: 15:00:00 50 ug via INTRAVENOUS
  Filled 2019-01-25: qty 2

## 2019-01-25 MED ORDER — POTASSIUM CHLORIDE IN NACL 20-0.9 MEQ/L-% IV SOLN
Freq: Once | INTRAVENOUS | Status: AC
  Administered 2019-01-25: 14:00:00 via INTRAVENOUS
  Filled 2019-01-25: qty 1000

## 2019-01-25 MED ORDER — SODIUM CHLORIDE 0.9% IV SOLUTION
Freq: Once | INTRAVENOUS | Status: AC
  Administered 2019-01-25: 17:00:00 via INTRAVENOUS

## 2019-01-25 MED ORDER — PANTOPRAZOLE SODIUM 40 MG PO TBEC
40.0000 mg | DELAYED_RELEASE_TABLET | Freq: Every day | ORAL | Status: DC
  Administered 2019-01-26: 40 mg via ORAL
  Filled 2019-01-25: qty 1

## 2019-01-25 MED ORDER — OXYMETAZOLINE HCL 0.05 % NA SOLN
1.0000 | Freq: Two times a day (BID) | NASAL | Status: DC
  Administered 2019-01-25 – 2019-01-26 (×2): 1 via NASAL
  Filled 2019-01-25: qty 15

## 2019-01-25 MED ORDER — OXYCODONE HCL 5 MG PO TABS
20.0000 mg | ORAL_TABLET | Freq: Four times a day (QID) | ORAL | Status: DC | PRN
  Administered 2019-01-25 – 2019-01-26 (×4): 20 mg via ORAL
  Filled 2019-01-25 (×4): qty 4

## 2019-01-25 NOTE — H&P (Signed)
History and Physical    Ashley Mayo PJA:[id number] DOB: December 29, 1965 DOA: 01/25/2019  PCP: System, Pcp Not In Patient coming from home Chief Complaint: Right upper extremity shakes and tremors yesterday HPI: Ashley Mayo is a 53 y.o. female with medical history significant of metastatic lung cancer, COPD, tobacco abuse usually follows up with G-tube for palliative chemo and XRT, last chemo was 2-1/2 weeks ago.  She lives in [place] so she came to [place] long she does not want to go to [place].  She goes to [facility] only for chemo and XRT. She noticed her right upper extremity was twitching and cramping yesterday.  Left side was fine.  No localized weakness.  No headaches changes with the patient syncope dysphagia or apraxia. She stopped taking her Lyrica thinking this might have caused the symptoms.  However this has not improved her symptoms. She has a good appetite.  Yesterday was her birthday.  But she was not able to eat much yesterday due to shakes and tremors.  No nausea vomiting.  She had multiple episodes of diarrhea for 1 week which has been resolved now.  She reports that she does not get diarrhea after chemotherapy usually.  Denies abdominal pain.  She has chronically short of breath.  No Covid contacts.  Lives at home with her son. She denies any hematuria hematochezia melena melena or hemoptysis.  She reports having nosebleed yesterday.  Covid is pending.  Chest x-ray shows areas of airspace opacity in the right upper lobe right middle lung and inferior left perihilar regions.  Suspect multifocal pneumonia.  No well-defined mass or adenopathy.  ED Course: Patient received a dose of Valium for the shakes and her shakes resolved.  Her sodium was 136 potassium 3.6 BUN 19 creatinine 0.66 calcium 6.2.  Albumin 3.2.  Alkaline phosphatase 128 AST 87 ALT 129 white count 1.8 hemoglobin 6.2 platelet count 6. ED physician ordered blood transfusion and platelet transfusion and calcium and magnesium  replacement. Vital signs in the ER 124/84 pulse is 104 respiration 20 temperature 98 sats 100% on room air.  Review of Systems: As per HPI otherwise all other systems reviewed and are negative  Ambulatory Status: Ambulatory at baseline.  Lives at home with her son.  Past Medical History:  Diagnosis Date  . Anxiety   . Asthma   . Cancer ([place])    lung cancer  . COPD (chronic obstructive pulmonary disease) ([place])   . Depression   . GERD (gastroesophageal reflux disease)   . Hypertension   . Migraine     Past Surgical History:  Procedure Laterality Date  . ABDOMINAL HYSTERECTOMY    . CESAREAN SECTION    . ORIF right hip fracture      Social History   Socioeconomic History  . Marital status: Divorced    Spouse name: Not on file  . Number of children: Not on file  . Years of education: Not on file  . Highest education level: Not on file  Occupational History  . Not on file  Social Needs  . Financial resource strain: Not on file  . Food insecurity    Worry: Not on file    Inability: Not on file  . Transportation needs    Medical: Not on file    Non-medical: Not on file  Tobacco Use  . Smoking status: Former Smoker    Packs/day: 0.25    Years: 35.00    Pack years: 8.75    Types: Cigarettes  . Smokeless tobacco:  Never Used  Substance and Sexual Activity  . Alcohol use: No  . Drug use: No  . Sexual activity: Never  Lifestyle  . Physical activity    Days per week: Not on file    Minutes per session: Not on file  . Stress: Not on file  Relationships  . Social Herbalist on phone: Not on file    Gets together: Not on file    Attends religious service: Not on file    Active member of club or organization: Not on file    Attends meetings of clubs or organizations: Not on file    Relationship status: Not on file  . Intimate partner violence    Fear of current or ex partner: Not on file    Emotionally abused: Not on file    Physically abused: Not on  file    Forced sexual activity: Not on file  Other Topics Concern  . Not on file  Social History Narrative  . Not on file    Allergies  Allergen Reactions  . Codeine Hives  . Shellfish Allergy Hives    Family History  Problem Relation Age of Onset  . Breast cancer Mother     Prior to Admission medications   Medication Sig Start Date End Date Taking? Authorizing Provider  ADDERALL XR 30 MG 24 hr capsule Take 30 mg by mouth daily.   Yes [provider]  albuterol (PROVENTIL HFA;VENTOLIN HFA) 108 (90 Base) MCG/ACT inhaler Inhale 1-2 puffs into the lungs every 6 (six) hours as needed for wheezing or shortness of breath.   Yes [provider]  albuterol (PROVENTIL) (2.5 MG/3ML) 0.083% nebulizer solution Take 2.5 mg by nebulization every 6 (six) hours as needed for wheezing or shortness of breath.    Yes [provider]  amphetamine-dextroamphetamine (ADDERALL) 10 MG tablet Take 10 mg by mouth every evening.   Yes [provider]  diphenhydrAMINE (BENADRYL) 25 MG tablet Take 25 mg by mouth at bedtime as needed for sleep.   Yes [provider]  esomeprazole (NEXIUM) 40 MG capsule Take 40 mg by mouth daily. 12/02/17  Yes [provider]  furosemide (LASIX) 20 MG tablet Take 20 mg by mouth daily as needed for fluid.    Yes [provider]  lidocaine (LIDODERM) 5 % Place 1-2 patches onto the skin daily as needed (for back pain).    Yes [provider]  lidocaine (XYLOCAINE) 2 % solution Use as directed 15 mLs in the mouth or throat every 6 (six) hours as needed for mouth pain.  11/24/18  Yes [provider]  metoprolol succinate (TOPROL-XL) 25 MG 24 hr tablet Take 25 mg by mouth daily.   Yes [provider]  nystatin (MYCOSTATIN) 100000 UNIT/ML suspension Take 5 mLs (500,000 Units total) by mouth 4 (four) times daily. 12/30/17  Yes Tacy Learn, PA-C  OLANZapine (ZYPREXA) 5 MG tablet Take 5 mg by mouth 2  (two) times daily.   Yes [provider]  ondansetron (ZOFRAN) 8 MG tablet Take 8 mg by mouth every 8 (eight) hours as needed for nausea or vomiting.  11/01/18  Yes [provider]  OXYCONTIN 20 MG 12 hr tablet Take 20 mg by mouth every 8 (eight) hours. 12/20/17  Yes [provider]  potassium chloride (MICRO-K) 10 MEQ CR capsule Take 10 mEq by mouth 2 (two) times daily.  01/06/19  Yes [provider]  SUMAtriptan (IMITREX) 6 MG/0.5ML  SOLN injection Inject 6 mg into the skin every 2 (two) hours as needed for migraine.  11/30/18  Yes [provider]  valACYclovir (VALTREX) 500 MG tablet Take 500 mg by mouth 2 (two) times daily as needed (cold sores).    Yes [provider]  nystatin (MYCOSTATIN) 100000 UNIT/ML suspension Take 5 mLs (500,000 Units total) by mouth 4 (four) times daily as needed (thrush). Patient not taking: Reported on 01/25/2019 12/30/17   Tacy Learn, PA-C    Physical Exam: Vitals:   01/25/19 1220 01/25/19 1230 01/25/19 1300 01/25/19 1431  BP:  132/87 125/85 124/84  Pulse:  (!) 110 (!) 102 97  Resp:  20 20 20   Temp:      TempSrc:      SpO2: 98% 97% 98% 100%  Weight: 59.9 kg     Height: 5\' 4"  (1.626 m)        . General:  Appears calm and comfortable . Eyes:  PERRL, EOMI, normal lids, iris . ENT:  grossly normal hearing, lips & tongue, mmm . Neck:  no LAD, masses or thyromegaly . Cardiovascular:  RRR, no m/r/g. No LE edema.  Marland Kitchen Respiratory: Few scattered rhonchi otherwise clear bilaterally, no w/r/r. Normal respiratory effort. . Abdomen:  soft, distended and nontender, NABS . Skin:  no rash or induration seen on limited exam . Musculoskeletal: grossly normal tone BUE/BLE, good ROM, no bony abnormality . Psychiatric:  grossly normal mood and affect, speech fluent and appropriate, AOx3 . Neurologic:  CN 2-12 grossly intact, moves all extremities in coordinated fashion, sensation intact  Labs on Admission: I have  personally reviewed following labs and imaging studies  CBC: Recent Labs  Lab 01/25/19 1244  WBC 1.8*  NEUTROABS 0.8*  HGB 6.2*  HCT 18.9*  MCV 106.8*  PLT 6*   Basic Metabolic Panel: Recent Labs  Lab 01/25/19 1244  NA 136  K 3.6  CL 101  CO2 23  GLUCOSE 89  BUN 19  CREATININE 0.66  CALCIUM 6.2*  MG 0.4*   GFR: Estimated Creatinine Clearance: 69.4 mL/min (by C-G formula based on SCr of 0.66 mg/dL). Liver Function Tests: Recent Labs  Lab 01/25/19 1244  AST 87*  ALT 129*  ALKPHOS 128*  BILITOT 0.4  PROT 6.1*  ALBUMIN 3.2*   No results for input(s): LIPASE, AMYLASE in the last 168 hours. No results for input(s): AMMONIA in the last 168 hours. Coagulation Profile: No results for input(s): INR, PROTIME in the last 168 hours. Cardiac Enzymes: No results for input(s): CKTOTAL, CKMB, CKMBINDEX, TROPONINI in the last 168 hours. BNP (last 3 results) No results for input(s): PROBNP in the last 8760 hours. HbA1C: No results for input(s): HGBA1C in the last 72 hours. CBG: No results for input(s): GLUCAP in the last 168 hours. Lipid Profile: No results for input(s): CHOL, HDL, LDLCALC, TRIG, CHOLHDL, LDLDIRECT in the last 72 hours. Thyroid Function Tests: No results for input(s): TSH, T4TOTAL, FREET4, T3FREE, THYROIDAB in the last 72 hours. Anemia Panel: No results for input(s): VITAMINB12, FOLATE, FERRITIN, TIBC, IRON, RETICCTPCT in the last 72 hours. Urine analysis:    Component Value Date/Time   COLORURINE YELLOW 05/12/2016 0815   APPEARANCEUR TURBID (A) 05/12/2016 0815   LABSPEC 1.019 05/12/2016 0815   PHURINE 6.0 05/12/2016 0815   GLUCOSEU NEGATIVE 05/12/2016 0815   HGBUR MODERATE (A) 05/12/2016 0815   BILIRUBINUR NEGATIVE 05/12/2016 0815   KETONESUR NEGATIVE 05/12/2016 0815   PROTEINUR 100 (A) 05/12/2016 0815   NITRITE NEGATIVE  05/12/2016 0815   LEUKOCYTESUR LARGE (A) 05/12/2016 0815    Creatinine Clearance: Estimated Creatinine Clearance: 69.4  mL/min (by C-G formula based on SCr of 0.66 mg/dL).  Sepsis Labs: @LABRCNTIP (procalcitonin:4,lacticidven:4) )No results found for this or any previous visit (from the past 240 hour(s)).   Radiological Exams on Admission: Dg Chest 2 View  Result Date: 01/25/2019 CLINICAL DATA:  Wheezing EXAM: CHEST - 2 VIEW COMPARISON:  December 30, 2017 chest radiograph and chest CT FINDINGS: There is ill-defined opacity in the right mid and upper lung regions. There is subtle airspace opacity in the inferior left hilar region as well. The somewhat spiculated area seen previously in the right upper lobe is not appreciated as a discrete structure currently. Note that there is patchy airspace opacity overlying this area currently. There is a degree of underlying fibrotic change, better appreciated on prior CT. Heart size and pulmonary vascularity are normal. No adenopathy. No bone lesions. IMPRESSION: Areas of airspace opacity in the right upper lobe, right mid lung, and inferior left perihilar regions. Suspect multifocal pneumonia. No well-defined mass or adenopathy. Note that the apparent spiculated area in the right upper lobe seen previously on CT is not appreciable as a discrete structure by radiography. Given the appearance of this area on prior CT, repeat CT at this time to assess for stability is warranted. There is a degree of underlying fibrotic change, better seen on prior CT. Cardiac silhouette within normal limits.  No adenopathy evident. Electronically Signed   By: Lowella Grip III M.D.   On: 01/25/2019 13:41    EKG: Reviewed by me no acute EKG changes  Assessment/Plan Active Problems:   Hypomagnesemia #1 multiple electrolyte abnormalities including severe hypomagnesemia, severe hypocalcemia with mild hypokalemia.  This is likely secondary to decreased p.o. intake and diarrhea for 1 week.  Will replete aggressively and recheck levels this evening.  #2 pancytopenia patient admitted with a hemoglobin  of 6.2 white count of 1.8 platelet count of 6-last chemotherapy was 2-1/2 weeks ago.  Likely secondary to chemo induced.  Will transfuse 2 units of packed RBCs and 1 unit of platelets and repeat labs tomorrow morning.  Her ANC is 800.  Check FOBT.  #3?  Pneumonia chest x-ray shows possible pneumonia but patient feels at baseline.  Saturation 100% on room air.  She has a chronic cough.  She continues to smoke.  I am tempted to not started on antibiotics today however I will watch her closely and consider antibiotics if she becomes hypoxic or febrile or tachypneic.  Consider repeating chest x-ray in a.m. after IV fluids.  #4 metastatic lung cancer with ongoing tobacco abuse-patient refused to have a nicotine patch placed.  Followed up at [facility] for XRT and palliative chemo.    #5 soft blood pressure with dehydration-patient takes Lasix 20 mg as needed at home which I will hold.   Normal saline 100 cc an hour Hold metoprolol 25 mg daily.  #6 mildly elevated LFTs question secondary to malignancy follow-up in a.m.  #7 migraine continue Adderall and Imitrex  Severity of Illness: The appropriate patient status for this patient is INPATIENT. Inpatient status is judged to be reasonable and necessary in order to provide the required intensity of service to ensure the patient's safety. The patient's presenting symptoms, physical exam findings, and initial radiographic and laboratory data in the context of their chronic comorbidities is felt to place them at high risk for further clinical deterioration. Furthermore, it is not anticipated that the patient  will be medically stable for discharge from the hospital within 2 midnights of admission. The following factors support the patient status of inpatient.   " The patient's presenting symptoms include right upper extremity tremors and twitching " The worrisome physical exam findings include dehydration. " The initial radiographic and laboratory data are worrisome  because of severe hypomagnesemia and hypocalcemia " The chronic co-morbidities include metastatic lung cancer   * I certify that at the point of admission it is my clinical judgment that the patient will require inpatient hospital care spanning beyond 2 midnights from the point of admission due to high intensity of service, high risk for further deterioration and high frequency of surveillance required.*    Estimated body mass index is 22.66 kg/m as calculated from the following:   Height as of this encounter: 5\' 4"  (1.626 m).   Weight as of this encounter: 59.9 kg. DVT prophylaxis none due to severe thrombocytopenia and anemia Code Status: Full code Family Communication: Discussed with son at bedside Disposition Plan: Pending clinical improvement  Admission status: Ambulatory at baseline   Georgette Shell MD [facility]  If 7PM-7AM, please contact night-coverage [email or web address] Password [facility]  01/25/2019, 4:03 PM

## 2019-01-25 NOTE — ED Triage Notes (Signed)
Tremors/twitching R side x3 days. Pt on Lyrica x1 week (stopped taking it d/t symptoms).   BP 150/90 P 110 RR 16 CBG 117  T 99.0

## 2019-01-25 NOTE — ED Notes (Signed)
Pure wick has been placed. Suction set to 45mmHg.  

## 2019-01-25 NOTE — Progress Notes (Signed)
Hgb 6.2  Plt 6  Lab called critical values

## 2019-01-25 NOTE — ED Provider Notes (Signed)
[place] DEPT Provider Note   CSN: [id number] Arrival date & time: 01/25/19  1154     History   Chief Complaint No chief complaint on file.   HPI Ashley Mayo is a 53 y.o. female.  Metastatic lung cancer currently on chemotherapy, radiation.  Presents to the [facility] department with chief complaint of right-sided twitching.  Patient states to change, tremor started approximately 3 days ago and has steadily been worsening.  Today states the twitching is much more severe than yesterday.  Seems to be occurring mostly on the right side but occasionally on the left side.  She has no associated numbness or weakness, no vision changes, no speech changes.  Has never had this before.  Patient was put on Lyrica around 1 week ago but she stopped taking this with suspicion that this may be causing her symptoms.  No other new medications.  Followed by [facility] for oncology.     HPI  Past Medical History:  Diagnosis Date   Anxiety    Asthma    Cancer ([place])    lung cancer   COPD (chronic obstructive pulmonary disease) ([place])    Depression    GERD (gastroesophageal reflux disease)    Hypertension    Migraine     Patient Active Problem List   Diagnosis Date Noted   Perforated ear drum, bilateral 01/25/2016   Pneumonia 01/23/2016   Healthcare-associated pneumonia 01/23/2016   Hypokalemia 01/23/2016   Anxiety 01/23/2016   Depression 01/23/2016   Metastatic lung cancer (metastasis from lung to other site) [facility]) 01/23/2016   Brain metastasis ([place]) 01/23/2016   Essential hypertension 01/23/2016    Past Surgical History:  Procedure Laterality Date   ABDOMINAL HYSTERECTOMY     CESAREAN SECTION     ORIF right hip fracture       OB History   No obstetric history on file.      Home Medications    Prior to Admission medications   Medication Sig Start Date End Date Taking? Authorizing Provider  ADDERALL XR 30 MG 24 hr capsule Take 30  mg by mouth daily.    [provider]  albuterol (PROVENTIL HFA;VENTOLIN HFA) 108 (90 Base) MCG/ACT inhaler Inhale 1-2 puffs into the lungs every 6 (six) hours as needed for wheezing or shortness of breath.    [provider]  albuterol (PROVENTIL) (2.5 MG/3ML) 0.083% nebulizer solution Take 2.5 mg by nebulization every 6 (six) hours as needed for wheezing or shortness of breath.     [provider]  amphetamine-dextroamphetamine (ADDERALL) 10 MG tablet Take 10 mg by mouth every evening.    [provider]  atorvastatin (LIPITOR) 40 MG tablet Take 40 mg by mouth daily. 11/22/17   [provider]  diphenhydrAMINE (BENADRYL) 25 MG tablet Take 25 mg by mouth at bedtime as needed for sleep.    [provider]  esomeprazole (NEXIUM) 40 MG capsule Take 40 mg by mouth daily. 12/02/17   [provider]  furosemide (LASIX) 20 MG tablet Take 40 mg by mouth daily.    [provider]  lidocaine (LIDODERM) 5 % Place 1-2 patches onto the skin daily as needed (for back pain).     [provider]  LORazepam (ATIVAN) 0.5 MG tablet Take 0.5 mg by mouth every 6 (six) hours as needed for anxiety.    [provider]  metoprolol succinate (TOPROL-XL) 25 MG 24 hr tablet Take 25 mg by mouth daily.    [provider]  nicotine (NICODERM CQ - DOSED IN MG/24 HOURS) 14 mg/24hr patch Place 1 patch onto the skin See admin instructions. Place 1 patch onto the skin daily. Alternate arms/sites when applying patch. 75 old patch when applying new one. 11/22/17   [provider]  nystatin (MYCOSTATIN) 100000 UNIT/ML suspension Take 5 mLs (500,000 Units total) by mouth 4 (four) times daily. 12/30/17   Tacy Learn, PA-C  nystatin (MYCOSTATIN) 100000 UNIT/ML suspension Take 5 mLs (500,000 Units total) by mouth 4 (four) times daily as needed (thrush). 12/30/17   Tacy Learn, PA-C  OXYCONTIN 20 MG 12 hr tablet Take 20 mg by  mouth every 8 (eight) hours. 12/20/17   [provider]  prochlorperazine (COMPAZINE) 10 MG tablet Take 10 mg by mouth every 6 (six) hours as needed for nausea or vomiting.    [provider]  valACYclovir (VALTREX) 500 MG tablet Take 500 mg by mouth 2 (two) times daily as needed (cold sores).     [provider]    Family History Family History  Problem Relation Age of Onset   Breast cancer Mother     Social History Social History   Tobacco Use   Smoking status: Former Smoker    Packs/day: 0.25    Years: 35.00    Pack years: 8.75    Types: Cigarettes   Smokeless tobacco: Never Used  Substance Use Topics   Alcohol use: No   Drug use: No     Allergies   Codeine and Shellfish allergy   Review of Systems Review of Systems  Constitutional: Negative for chills and fever.  HENT: Negative for ear pain and sore throat.   Eyes: Negative for pain and visual disturbance.  Respiratory: Negative for cough and shortness of breath.   Cardiovascular: Negative for chest pain and palpitations.  Gastrointestinal: Negative for abdominal pain and vomiting.  Genitourinary: Negative for dysuria and hematuria.  Musculoskeletal: Negative for arthralgias and back pain.  Skin: Negative for color change and rash.  Neurological: Positive for tremors. Negative for seizures and syncope.  All other systems reviewed and are negative.    Physical Exam Updated Vital Signs BP 125/85    Pulse (!) 102    Temp 98 F (36.7 C) (Oral)    Resp 20    Ht 5\' 4"  (1.626 m)    Wt 59.9 kg    SpO2 98%    BMI 22.66 kg/m   Physical Exam Vitals signs and nursing note reviewed.  Constitutional:      General: She is not in acute distress.    Appearance: She is well-developed.     Comments: Somewhat cachectic, chronically ill-appearing  HENT:     Head: Normocephalic and atraumatic.  Eyes:     Conjunctiva/sclera: Conjunctivae normal.  Neck:     Musculoskeletal: Neck supple.    Cardiovascular:     Rate and Rhythm: Normal rate and regular rhythm.     Heart sounds: No murmur.  Pulmonary:     Effort: Pulmonary effort is normal. No respiratory distress.     Breath sounds: Normal breath sounds.  Abdominal:     Palpations: Abdomen is soft.     Tenderness: There is no abdominal tenderness.  Musculoskeletal:        General: No swelling, tenderness or deformity.     Comments: Frequent tremor on RUE and RLE extremity  Skin:    General: Skin is warm and dry.  Neurological:     General:  No focal deficit present.     Mental Status: She is alert and oriented to person, place, and time.     Comments: Alert, oriented x3, 5 out of 5 strength in bilateral upper and lower extremities, sensation to light touch intact in all 4 extremities      ED Treatments / Results  Labs (all labs ordered are listed, but only abnormal results are displayed) Labs Reviewed  CBC WITH DIFFERENTIAL/PLATELET - Abnormal; Notable for the following components:      Result Value   WBC 1.8 (*)    RBC 1.77 (*)    Hemoglobin 6.2 (*)    HCT 18.9 (*)    MCV 106.8 (*)    MCH 35.0 (*)    RDW 20.0 (*)    Platelets 6 (*)    nRBC 1.1 (*)    Neutro Abs 0.8 (*)    All other components within normal limits  COMPREHENSIVE METABOLIC PANEL - Abnormal; Notable for the following components:   Calcium 6.2 (*)    Total Protein 6.1 (*)    Albumin 3.2 (*)    AST 87 (*)    ALT 129 (*)    Alkaline Phosphatase 128 (*)    All other components within normal limits  MAGNESIUM - Abnormal; Notable for the following components:   Magnesium 0.4 (*)    All other components within normal limits  TYPE AND SCREEN  PREPARE RBC (CROSSMATCH)  PREPARE PLATELET PHERESIS    EKG None  Radiology Dg Chest 2 View  Result Date: 01/25/2019 CLINICAL DATA:  Wheezing EXAM: CHEST - 2 VIEW COMPARISON:  December 30, 2017 chest radiograph and chest CT FINDINGS: There is ill-defined opacity in the right mid and upper lung  regions. There is subtle airspace opacity in the inferior left hilar region as well. The somewhat spiculated area seen previously in the right upper lobe is not appreciated as a discrete structure currently. Note that there is patchy airspace opacity overlying this area currently. There is a degree of underlying fibrotic change, better appreciated on prior CT. Heart size and pulmonary vascularity are normal. No adenopathy. No bone lesions. IMPRESSION: Areas of airspace opacity in the right upper lobe, right mid lung, and inferior left perihilar regions. Suspect multifocal pneumonia. No well-defined mass or adenopathy. Note that the apparent spiculated area in the right upper lobe seen previously on CT is not appreciable as a discrete structure by radiography. Given the appearance of this area on prior CT, repeat CT at this time to assess for stability is warranted. There is a degree of underlying fibrotic change, better seen on prior CT. Cardiac silhouette within normal limits.  No adenopathy evident. Electronically Signed   By: Lowella Grip III M.D.   On: 01/25/2019 13:41    Procedures .Critical Care Performed by: Lucrezia Starch, MD Authorized by: Lucrezia Starch, MD   Critical care provider statement:    Critical care time (minutes):  45   Critical care was necessary to treat or prevent imminent or life-threatening deterioration of the following conditions:  Metabolic crisis   Critical care was time spent personally by me on the following activities:  Discussions with consultants, evaluation of patient's response to treatment, examination of patient, ordering and performing treatments and interventions, ordering and review of laboratory studies, ordering and review of radiographic studies, pulse oximetry, re-evaluation of patient's condition, obtaining history from patient or surrogate and review of old charts   (including critical care time)  Medications Ordered in  ED Medications    magnesium sulfate IVPB 2 g 50 mL (has no administration in time range)  0.9 % NaCl with KCl 20 mEq/ L  infusion (has no administration in time range)  0.9 %  sodium chloride infusion (Manually program via Guardrails IV Fluids) (has no administration in time range)  diazepam (VALIUM) tablet 5 mg (5 mg Oral Given 01/25/19 1254)     Initial Impression / Assessment and Plan / ED Course  I have reviewed the triage vital signs and the nursing notes.  Pertinent labs & imaging results that were available during my care of the patient were reviewed by me and considered in my medical decision making (see chart for details).  Clinical Course as of Jan 24 1499  Wed Jan 25, 2019  1445 Updated patient, now willing to stay   [RD]  1459 Discussed with hospitalist, Zigmund Daniel will admit   [RD]    Clinical Course User Index [RD] Lucrezia Starch, MD       53 year old lady metastatic lung cancer on chemo and radiation presented to the ER with tremors, twitching in her extremity.  On exam no distress, stable vitals, noted to have significant shaking in her right upper and lower extremities.  This resolved after single dose of Valium.  Check electrolytes and basic labs to further investigate.  She had pancytopenia, significant anemia, thrombocytopenia as well as leukopenia.  ANC around 800.  No fever, did have a nosebleed yesterday.  No other bleeding.  Electrolytes concerning for profound hypomagnesemia, hypocalcemia, borderline hypokalemia.  No EKG changes associated with these electrolyte derangements.  Started calcium and magnesium replacement.  Consult hospitalist for further management.  Dr. Zigmund Daniel accepting.  Final Clinical Impressions(s) / ED Diagnoses   Final diagnoses:  Symptomatic anemia  Thrombocytopenia (HCC)  Leukopenia, unspecified type  Hypomagnesemia  Hypocalcemia    ED Discharge Orders    None       Lucrezia Starch, MD 01/25/19 [phone]

## 2019-01-26 LAB — CBC
HCT: 26.7 % — ABNORMAL LOW (ref 36.0–46.0)
Hemoglobin: 9 g/dL — ABNORMAL LOW (ref 12.0–15.0)
MCH: 33.6 pg (ref 26.0–34.0)
MCHC: 33.7 g/dL (ref 30.0–36.0)
MCV: 99.6 fL (ref 80.0–100.0)
Platelets: 49 10*3/uL — ABNORMAL LOW (ref 150–400)
RBC: 2.68 MIL/uL — ABNORMAL LOW (ref 3.87–5.11)
RDW: 20.3 % — ABNORMAL HIGH (ref 11.5–15.5)
WBC: 1.2 10*3/uL — CL (ref 4.0–10.5)
nRBC: 0 % (ref 0.0–0.2)

## 2019-01-26 LAB — PREPARE PLATELET PHERESIS: Unit division: 0

## 2019-01-26 LAB — BPAM PLATELET PHERESIS
Blood Product Expiration Date: 202012031519
ISSUE DATE / TIME: 202012021701
Unit Type and Rh: 7300

## 2019-01-26 LAB — SARS CORONAVIRUS 2 (TAT 6-24 HRS): SARS Coronavirus 2: NEGATIVE

## 2019-01-26 LAB — COMPREHENSIVE METABOLIC PANEL
ALT: 110 U/L — ABNORMAL HIGH (ref 0–44)
AST: 70 U/L — ABNORMAL HIGH (ref 15–41)
Albumin: 3.2 g/dL — ABNORMAL LOW (ref 3.5–5.0)
Alkaline Phosphatase: 129 U/L — ABNORMAL HIGH (ref 38–126)
Anion gap: 10 (ref 5–15)
BUN: 14 mg/dL (ref 6–20)
CO2: 26 mmol/L (ref 22–32)
Calcium: 7.1 mg/dL — ABNORMAL LOW (ref 8.9–10.3)
Chloride: 102 mmol/L (ref 98–111)
Creatinine, Ser: 0.6 mg/dL (ref 0.44–1.00)
GFR calc Af Amer: >60 mL/min (ref >60)
GFR calc non Af Amer: >60 mL/min (ref >60)
Glucose, Bld: 95 mg/dL (ref 70–99)
Potassium: 3.6 mmol/L (ref 3.5–5.1)
Sodium: 138 mmol/L (ref 135–145)
Total Bilirubin: 0.6 mg/dL (ref 0.3–1.2)
Total Protein: 6 g/dL — ABNORMAL LOW (ref 6.5–8.1)

## 2019-01-26 MED ORDER — MAGNESIUM 200 MG PO TABS: 200.0000 mg | ORAL_TABLET | Freq: Every day | ORAL | 0 refills | Status: DC

## 2019-01-26 NOTE — Progress Notes (Signed)
[place] to be D/C'd Home per MD order.  Discussed prescriptions and follow up appointments with the patient. Prescriptions given to patient, medication list explained in detail. Pt verbalized understanding.  Allergies as of 01/26/2019      Reactions   Codeine Hives   Shellfish Allergy Hives      Medication List    TAKE these medications   Adderall XR 30 MG 24 hr capsule Generic drug: amphetamine-dextroamphetamine Take 30 mg by mouth daily.   amphetamine-dextroamphetamine 10 MG tablet Commonly known as: ADDERALL Take 10 mg by mouth every evening.   albuterol (2.5 MG/3ML) 0.083% nebulizer solution Commonly known as: PROVENTIL Take 2.5 mg by nebulization every 6 (six) hours as needed for wheezing or shortness of breath.   albuterol 108 (90 Base) MCG/ACT inhaler Commonly known as: VENTOLIN HFA Inhale 1-2 puffs into the lungs every 6 (six) hours as needed for wheezing or shortness of breath.   diphenhydrAMINE 25 MG tablet Commonly known as: BENADRYL Take 25 mg by mouth at bedtime as needed for sleep.   esomeprazole 40 MG capsule Commonly known as: NEXIUM Take 40 mg by mouth daily.   furosemide 20 MG tablet Commonly known as: LASIX Take 20 mg by mouth daily as needed for fluid.   lidocaine 2 % solution Commonly known as: XYLOCAINE Use as directed 15 mLs in the mouth or throat every 6 (six) hours as needed for mouth pain.   lidocaine 5 % Commonly known as: LIDODERM Place 1-2 patches onto the skin daily as needed (for back pain).   Magnesium 200 MG Tabs Take 1 tablet (200 mg total) by mouth daily.   metoprolol succinate 25 MG 24 hr tablet Commonly known as: TOPROL-XL Take 25 mg by mouth daily.   nystatin 100000 UNIT/ML suspension Commonly known as: MYCOSTATIN Take 5 mLs (500,000 Units total) by mouth 4 (four) times daily. What changed: Another medication with the same name was removed. Continue taking this medication, and follow the directions you see here.    OLANZapine 5 MG tablet Commonly known as: ZYPREXA Take 5 mg by mouth 2 (two) times daily.   ondansetron 8 MG tablet Commonly known as: ZOFRAN Take 8 mg by mouth every 8 (eight) hours as needed for nausea or vomiting.   OxyCONTIN 20 mg 12 hr tablet Generic drug: oxyCODONE Take 20 mg by mouth every 8 (eight) hours.   potassium chloride 10 MEQ CR capsule Commonly known as: MICRO-K Take 10 mEq by mouth 2 (two) times daily.   SUMAtriptan 6 MG/0.5ML Soln injection Commonly known as: IMITREX Inject 6 mg into the skin every 2 (two) hours as needed for migraine.   valACYclovir 500 MG tablet Commonly known as: VALTREX Take 500 mg by mouth 2 (two) times daily as needed (cold sores).       Vitals:   01/26/19 0605 01/26/19 1335  BP: (!) 129/91 126/85  Pulse: 88 91  Resp: 16 16  Temp: 98.2 F (36.8 C) 98.5 F (36.9 C)  SpO2: 100% 100%    Skin clean, dry and intact without evidence of skin break down, no evidence of skin tears noted. IV catheter discontinued intact. Site without signs and symptoms of complications. Dressing and pressure applied. Pt denies pain at this time. No complaints noted.  An After Visit Summary was printed and given to the patient. Patient escorted via [place], and D/C home via private auto.  Nonie Hoyer S 01/26/2019 2:01 PM

## 2019-01-26 NOTE — Plan of Care (Signed)

## 2019-01-26 NOTE — Progress Notes (Signed)
CRITICAL VALUE ALERT  Critical Value:  WBC 1.2  Date & Time Notied:  01/26/19 at 0812  Provider Notified: Pietro Cassis, MD  Orders Received/Actions taken: Awaiting new orders

## 2019-01-26 NOTE — Discharge Summary (Signed)
Physician Discharge Summary  Ashley Mayo UKG:[id number] DOB: Dec 06, 1965 DOA: 01/25/2019  PCP: System, Pcp Not In  Admit date: 01/25/2019 Discharge date: 01/26/2019  Admitted From: Home Discharge disposition: Home   Code Status: Full Code  Diet Recommendation: Regular  Recommendations for Outpatient Follow-Up:   1. Follow-up with oncologist as an outpatient 2. Follow-up with primary care provider as an outpatient.  Needs repeat potassium and magnesium level done on Monday 12/7.  Discharge Diagnosis:   Active Problems:   Hypomagnesemia   Symptomatic anemia   Thrombocytopenia (HCC)   Leukopenia  History of Present Illness / Brief narrative:  Ashley Mayo is a 53 y.o. female with medical history significant of metastatic lung cancer, COPD, tobacco abuse usually follows up with G-tube for palliative chemo and XRT, last chemo was 2-1/2 weeks ago.  She lives in [place] so she came to [place] long she does not want to go to [place].  She goes to [facility] only for chemo and XRT. 12/1, patient noticed her right upper extremity was twitching and cramping.  Symptoms did not improve at home and she decided to come to the ED on 12/2.   Of note, per preceding 1 week, patient had multiple episodes of diarrhea which eventually resolved.  She reports that she does not get diarrhea after chemotherapy usually.  Denies abdominal pain.  She is chronically short of breath. Lives at home with her son.  Covid PCR negative.  Chest x-ray showed areas of airspace opacity in the right upper lobe right middle lung and inferior left perihilar regions.  Suspect multifocal pneumonia.  No well-defined mass or adenopathy.  ED Course:  Vital signs in the ER 124/84 pulse is 104 respiration 20 temperature 98 sats 100% on room air. Patient received a dose of Valium for the shakes and her shakes resolved.  Her sodium was 136 potassium 3.6, magnesium level was significantly low at 0.4, BUN 19 creatinine 0.66 calcium  6.2.  Albumin 3.2.  Alkaline phosphatase 128 AST 87 ALT 129 white count 1.8 hemoglobin 6.2 platelet count 6. ED physician ordered blood transfusion and platelet transfusion and calcium and magnesium replacement.  Hospital Course:  Severe hypomagnesemia -multiple electrolyte abnormalities including severe hypomagnesemia, magnesium level 0.4, severe hypocalcemia 6.2, with mild hypokalemia 3. -This was likely secondary to decreased p.o. intake and diarrhea for 1 week.   -Will replete aggressively and recheck levels this evening. -Patient takes Lasix 20 mg daily at home.  I advised her to switch it to as needed because of her risk of poor oral intake and dehydration. -Patient takes potassium supplement at home.  I will start her on magnesium oxide 200 mg daily as well.  I recommended her to check potassium and magnesium level with PCP next Monday, 12/7.  Pancytopenia  -Patient was admitted with a hemoglobin of 6.2, white count of 1.8, platelet count of 6.  This is likely because of chemotherapy.  Last chemotherapy was 2-1/2 weeks ago. -Patient received 2 units of PRBC and 1 unit of platelets. -Labs from this morning showed improvement in hemoglobin to 9, platelets to 49. -WBC count continues to remain low. It is 1.8 yesterday with absolute neutrophil count of 800, total count 1.2 today.  No fever.  Abnormal chest x-ray  -Chest x-ray obtained on admission showed areas of airspace opacity in the right upper lobe, right middle lobe and inferior left perihilar regions raising suspicion of multifocal pneumonia.  Patient does not endorse any respiratory symptoms. She has chronic cough. She is  breathing comfortably on room air.  She was not started on antibiotics on admission.  She remained stable. I wonder if the chest x-ray findings are secondary to metastatic lung cancer and not really an infection. I would not to start on any antibiotics at discharge.    Metastatic lung cancer with ongoing tobacco  abuse  -patient refused to have a nicotine patch placed.  Counseled to stop smoking. Followed up at [facility] for XRT and palliative chemo.    Mildly elevated LFTs  -likely secondary to malignancy follow-up as an outpatient.  Migraine  -continue Adderall and Imitrex  Stable for discharge home today  Subjective:  Seen and examined this morning. Sitting up in bed. Not in distress. Feels ready to go home.  Discharge Exam:   Vitals:   01/26/19 0214 01/26/19 0230 01/26/19 0526 01/26/19 0605  BP: 122/67 135/76 (!) 130/93 (!) 129/91  Pulse: 93 83 89 88  Resp: 16  15 16   Temp: (!) 97.5 F (36.4 C) 98.3 F (36.8 C) 98.7 F (37.1 C) 98.2 F (36.8 C)  TempSrc: Oral Oral Oral Oral  SpO2:  98% 99% 100%  Weight:      Height:        Body mass index is 22.66 kg/m.  General exam: Appears calm and comfortable.  Skin: No rashes, lesions or ulcers. HEENT: Atraumatic, normocephalic, supple neck, no obvious bleeding Lungs: Clear to auscultate bilaterally CVS: Regular rate and rhythm, no murmur GI/Abd soft, nontender, nondistended, bowel sound present CNS: Alert, awake, oriented x3 Psychiatry: Mood appropriate Extremities: No pedal edema, no calf tenderness  Discharge Instructions:  Wound care: None Discharge Instructions    Increase activity slowly   Complete by: As directed      Follow-up Information    Patient's oncologist at [place] Follow up.          Allergies as of 01/26/2019      Reactions   Codeine Hives   Shellfish Allergy Hives      Medication List    TAKE these medications   Adderall XR 30 MG 24 hr capsule Generic drug: amphetamine-dextroamphetamine Take 30 mg by mouth daily.   amphetamine-dextroamphetamine 10 MG tablet Commonly known as: ADDERALL Take 10 mg by mouth every evening.   albuterol (2.5 MG/3ML) 0.083% nebulizer solution Commonly known as: PROVENTIL Take 2.5 mg by nebulization every 6 (six) hours as needed for wheezing or shortness of breath.     albuterol 108 (90 Base) MCG/ACT inhaler Commonly known as: VENTOLIN HFA Inhale 1-2 puffs into the lungs every 6 (six) hours as needed for wheezing or shortness of breath.   diphenhydrAMINE 25 MG tablet Commonly known as: BENADRYL Take 25 mg by mouth at bedtime as needed for sleep.   esomeprazole 40 MG capsule Commonly known as: NEXIUM Take 40 mg by mouth daily.   furosemide 20 MG tablet Commonly known as: LASIX Take 20 mg by mouth daily as needed for fluid.   lidocaine 2 % solution Commonly known as: XYLOCAINE Use as directed 15 mLs in the mouth or throat every 6 (six) hours as needed for mouth pain.   lidocaine 5 % Commonly known as: LIDODERM Place 1-2 patches onto the skin daily as needed (for back pain).   Magnesium 200 MG Tabs Take 1 tablet (200 mg total) by mouth daily.   metoprolol succinate 25 MG 24 hr tablet Commonly known as: TOPROL-XL Take 25 mg by mouth daily.   nystatin 100000 UNIT/ML suspension Commonly known as: MYCOSTATIN Take 5  mLs (500,000 Units total) by mouth 4 (four) times daily. What changed: Another medication with the same name was removed. Continue taking this medication, and follow the directions you see here.   OLANZapine 5 MG tablet Commonly known as: ZYPREXA Take 5 mg by mouth 2 (two) times daily.   ondansetron 8 MG tablet Commonly known as: ZOFRAN Take 8 mg by mouth every 8 (eight) hours as needed for nausea or vomiting.   OxyCONTIN 20 mg 12 hr tablet Generic drug: oxyCODONE Take 20 mg by mouth every 8 (eight) hours.   potassium chloride 10 MEQ CR capsule Commonly known as: MICRO-K Take 10 mEq by mouth 2 (two) times daily.   SUMAtriptan 6 MG/0.5ML Soln injection Commonly known as: IMITREX Inject 6 mg into the skin every 2 (two) hours as needed for migraine.   valACYclovir 500 MG tablet Commonly known as: VALTREX Take 500 mg by mouth 2 (two) times daily as needed (cold sores).       Time coordinating discharge: 35  minutes  The results of significant diagnostics from this hospitalization (including imaging, microbiology, ancillary and laboratory) are listed below for reference.    Procedures and Diagnostic Studies:   Dg Chest 2 View  Result Date: 01/25/2019 CLINICAL DATA:  Wheezing EXAM: CHEST - 2 VIEW COMPARISON:  December 30, 2017 chest radiograph and chest CT FINDINGS: There is ill-defined opacity in the right mid and upper lung regions. There is subtle airspace opacity in the inferior left hilar region as well. The somewhat spiculated area seen previously in the right upper lobe is not appreciated as a discrete structure currently. Note that there is patchy airspace opacity overlying this area currently. There is a degree of underlying fibrotic change, better appreciated on prior CT. Heart size and pulmonary vascularity are normal. No adenopathy. No bone lesions. IMPRESSION: Areas of airspace opacity in the right upper lobe, right mid lung, and inferior left perihilar regions. Suspect multifocal pneumonia. No well-defined mass or adenopathy. Note that the apparent spiculated area in the right upper lobe seen previously on CT is not appreciable as a discrete structure by radiography. Given the appearance of this area on prior CT, repeat CT at this time to assess for stability is warranted. There is a degree of underlying fibrotic change, better seen on prior CT. Cardiac silhouette within normal limits.  No adenopathy evident. Electronically Signed   By: Lowella Grip III M.D.   On: 01/25/2019 13:41     Labs:   Basic Metabolic Panel: Recent Labs  Lab 01/25/19 1244 01/25/19 1631 01/26/19 0725  NA 136 139 138  K 3.6 3.0* 3.6  CL 101 103 102  CO2 23 27 26   GLUCOSE 89 81 95  BUN 19 16 14   CREATININE 0.66 0.73 0.60  CALCIUM 6.2* 6.9* 7.1*  MG 0.4* 2.1  --    GFR Estimated Creatinine Clearance: 69.4 mL/min (by C-G formula based on SCr of 0.6 mg/dL). Liver Function Tests: Recent Labs  Lab  01/25/19 1244 01/25/19 1631 01/26/19 0725  AST 87* 89* 70*  ALT 129* 133* 110*  ALKPHOS 128* 123 129*  BILITOT 0.4 0.5 0.6  PROT 6.1* 6.4* 6.0*  ALBUMIN 3.2* 3.5 3.2*   No results for input(s): LIPASE, AMYLASE in the last 168 hours. No results for input(s): AMMONIA in the last 168 hours. Coagulation profile No results for input(s): INR, PROTIME in the last 168 hours.  CBC: Recent Labs  Lab 01/25/19 1244 01/26/19 0725  WBC 1.8* 1.2*  NEUTROABS  0.8*  --   HGB 6.2* 9.0*  HCT 18.9* 26.7*  MCV 106.8* 99.6  PLT 6* 49*   Cardiac Enzymes: No results for input(s): CKTOTAL, CKMB, CKMBINDEX, TROPONINI in the last 168 hours. BNP: Invalid input(s): POCBNP CBG: No results for input(s): GLUCAP in the last 168 hours. D-Dimer No results for input(s): DDIMER in the last 72 hours. Hgb A1c No results for input(s): HGBA1C in the last 72 hours. Lipid Profile No results for input(s): CHOL, HDL, LDLCALC, TRIG, CHOLHDL, LDLDIRECT in the last 72 hours. Thyroid function studies No results for input(s): TSH, T4TOTAL, T3FREE, THYROIDAB in the last 72 hours.  Invalid input(s): FREET3 Anemia work up No results for input(s): VITAMINB12, FOLATE, FERRITIN, TIBC, IRON, RETICCTPCT in the last 72 hours. Microbiology Recent Results (from the past 240 hour(s))  SARS CORONAVIRUS 2 (TAT 6-24 HRS) Nasopharyngeal Nasopharyngeal Swab     Status: None   Collection Time: 01/25/19  2:46 PM   Specimen: Nasopharyngeal Swab  Result Value Ref Range Status   SARS Coronavirus 2 NEGATIVE NEGATIVE Final    Comment: (NOTE) SARS-CoV-2 target nucleic acids are NOT DETECTED. The SARS-CoV-2 RNA is generally detectable in upper and lower respiratory specimens during the acute phase of infection. Negative results do not preclude SARS-CoV-2 infection, do not rule out co-infections with other pathogens, and should not be used as the sole basis for treatment or other patient management decisions. Negative results must be  combined with clinical observations, patient history, and epidemiological information. The expected result is Negative. Fact Sheet for Patients: SugarRoll.be Fact Sheet for Healthcare Providers: https://www.woods-mathews.com/ This test is not yet approved or cleared by the [place] FDA and  has been authorized for detection and/or diagnosis of SARS-CoV-2 by FDA under an Emergency Use Authorization (EUA). This EUA will remain  in effect (meaning this test can be used) for the duration of the COVID-19 declaration under Section 56 4(b)(1) of the Act, 21 U.S.C. section 360bbb-3(b)(1), unless the authorization is terminated or revoked sooner. Performed at [facility], [place] [address], [place], [place] [address]     Please note: You were cared for by a hospitalist during your hospital stay. Once you are discharged, your primary care physician will handle any further medical issues. Please note that NO REFILLS for any discharge medications will be authorized once you are discharged, as it is imperative that you return to your primary care physician (or establish a relationship with a primary care physician if you do not have one) for your post hospital discharge needs so that they can reassess your need for medications and monitor your lab values.  Signed: Terrilee Croak  [facility] 01/26/2019, 1:19 PM

## 2019-01-27 ENCOUNTER — Encounter (HOSPITAL_COMMUNITY): Payer: Self-pay

## 2019-01-27 ENCOUNTER — Emergency Department (HOSPITAL_COMMUNITY): Payer: Medicaid Other

## 2019-01-27 ENCOUNTER — Inpatient Hospital Stay (HOSPITAL_COMMUNITY)
Admission: EM | Admit: 2019-01-27 | Discharge: 2019-01-28 | DRG: 100 | Disposition: A | Discharge status: Home / Self Care | Payer: Medicaid Other | Attending: Internal Medicine | Admitting: Internal Medicine

## 2019-01-27 ENCOUNTER — Other Ambulatory Visit: Payer: Self-pay

## 2019-01-27 DIAGNOSIS — T451X5A Adverse effect of antineoplastic and immunosuppressive drugs, initial encounter: Secondary | ICD-10-CM | POA: Diagnosis present

## 2019-01-27 DIAGNOSIS — Z885 Allergy status to narcotic agent status: Secondary | ICD-10-CM | POA: Diagnosis not present

## 2019-01-27 DIAGNOSIS — K219 Gastro-esophageal reflux disease without esophagitis: Secondary | ICD-10-CM | POA: Diagnosis present

## 2019-01-27 DIAGNOSIS — R251 Tremor, unspecified: Secondary | ICD-10-CM | POA: Diagnosis present

## 2019-01-27 DIAGNOSIS — C787 Secondary malignant neoplasm of liver and intrahepatic bile duct: Secondary | ICD-10-CM | POA: Diagnosis present

## 2019-01-27 DIAGNOSIS — J449 Chronic obstructive pulmonary disease, unspecified: Secondary | ICD-10-CM | POA: Diagnosis present

## 2019-01-27 DIAGNOSIS — J189 Pneumonia, unspecified organism: Secondary | ICD-10-CM | POA: Diagnosis present

## 2019-01-27 DIAGNOSIS — R252 Cramp and spasm: Secondary | ICD-10-CM | POA: Diagnosis present

## 2019-01-27 DIAGNOSIS — F419 Anxiety disorder, unspecified: Secondary | ICD-10-CM | POA: Diagnosis present

## 2019-01-27 DIAGNOSIS — C7931 Secondary malignant neoplasm of brain: Secondary | ICD-10-CM | POA: Diagnosis present

## 2019-01-27 DIAGNOSIS — R569 Unspecified convulsions: Secondary | ICD-10-CM | POA: Diagnosis present

## 2019-01-27 DIAGNOSIS — Z79899 Other long term (current) drug therapy: Secondary | ICD-10-CM | POA: Diagnosis not present

## 2019-01-27 DIAGNOSIS — G4089 Other seizures: Secondary | ICD-10-CM | POA: Diagnosis not present

## 2019-01-27 DIAGNOSIS — D649 Anemia, unspecified: Secondary | ICD-10-CM | POA: Diagnosis not present

## 2019-01-27 DIAGNOSIS — Z79891 Long term (current) use of opiate analgesic: Secondary | ICD-10-CM

## 2019-01-27 DIAGNOSIS — Z20828 Contact with and (suspected) exposure to other viral communicable diseases: Secondary | ICD-10-CM | POA: Diagnosis present

## 2019-01-27 DIAGNOSIS — G8929 Other chronic pain: Secondary | ICD-10-CM | POA: Diagnosis present

## 2019-01-27 DIAGNOSIS — C3490 Malignant neoplasm of unspecified part of unspecified bronchus or lung: Secondary | ICD-10-CM | POA: Diagnosis present

## 2019-01-27 DIAGNOSIS — F909 Attention-deficit hyperactivity disorder, unspecified type: Secondary | ICD-10-CM | POA: Diagnosis present

## 2019-01-27 DIAGNOSIS — Z86711 Personal history of pulmonary embolism: Secondary | ICD-10-CM | POA: Diagnosis not present

## 2019-01-27 DIAGNOSIS — Z91013 Allergy to seafood: Secondary | ICD-10-CM | POA: Diagnosis not present

## 2019-01-27 DIAGNOSIS — D696 Thrombocytopenia, unspecified: Secondary | ICD-10-CM | POA: Diagnosis present

## 2019-01-27 DIAGNOSIS — C7951 Secondary malignant neoplasm of bone: Secondary | ICD-10-CM | POA: Diagnosis present

## 2019-01-27 DIAGNOSIS — Z87891 Personal history of nicotine dependence: Secondary | ICD-10-CM | POA: Diagnosis not present

## 2019-01-27 DIAGNOSIS — F329 Major depressive disorder, single episode, unspecified: Secondary | ICD-10-CM | POA: Diagnosis present

## 2019-01-27 DIAGNOSIS — D72819 Decreased white blood cell count, unspecified: Secondary | ICD-10-CM | POA: Diagnosis present

## 2019-01-27 DIAGNOSIS — I10 Essential (primary) hypertension: Secondary | ICD-10-CM | POA: Diagnosis present

## 2019-01-27 LAB — CBC WITH DIFFERENTIAL/PLATELET
Abs Immature Granulocytes: 0.01 10*3/uL (ref 0.00–0.07)
Basophils Absolute: 0 10*3/uL (ref 0.0–0.1)
Basophils Relative: 0 %
Eosinophils Absolute: 0 10*3/uL (ref 0.0–0.5)
Eosinophils Relative: 1 %
HCT: 29.9 % — ABNORMAL LOW (ref 36.0–46.0)
Hemoglobin: 9.9 g/dL — ABNORMAL LOW (ref 12.0–15.0)
Immature Granulocytes: 1 %
Lymphocytes Relative: 38 %
Lymphs Abs: 0.5 10*3/uL — ABNORMAL LOW (ref 0.7–4.0)
MCH: 32.8 pg (ref 26.0–34.0)
MCHC: 33.1 g/dL (ref 30.0–36.0)
MCV: 99 fL (ref 80.0–100.0)
Monocytes Absolute: 0.2 10*3/uL (ref 0.1–1.0)
Monocytes Relative: 16 %
Neutro Abs: 0.5 10*3/uL — ABNORMAL LOW (ref 1.7–7.7)
Neutrophils Relative %: 44 %
Platelets: 36 10*3/uL — ABNORMAL LOW (ref 150–400)
RBC: 3.02 MIL/uL — ABNORMAL LOW (ref 3.87–5.11)
RDW: 20 % — ABNORMAL HIGH (ref 11.5–15.5)
WBC: 1.2 10*3/uL — CL (ref 4.0–10.5)
nRBC: 0 % (ref 0.0–0.2)

## 2019-01-27 LAB — BPAM RBC
Blood Product Expiration Date: 202012142359
Blood Product Expiration Date: 202012302359
ISSUE DATE / TIME: 202012022232
ISSUE DATE / TIME: 202012030205
Unit Type and Rh: 5100
Unit Type and Rh: 5100

## 2019-01-27 LAB — TYPE AND SCREEN
ABO/RH(D): O POS
Antibody Screen: NEGATIVE
Unit division: 0
Unit division: 0

## 2019-01-27 LAB — COMPREHENSIVE METABOLIC PANEL
ALT: 98 U/L — ABNORMAL HIGH (ref 0–44)
AST: 51 U/L — ABNORMAL HIGH (ref 15–41)
Albumin: 3.9 g/dL (ref 3.5–5.0)
Alkaline Phosphatase: 175 U/L — ABNORMAL HIGH (ref 38–126)
Anion gap: 12 (ref 5–15)
BUN: 19 mg/dL (ref 6–20)
CO2: 26 mmol/L (ref 22–32)
Calcium: 8.5 mg/dL — ABNORMAL LOW (ref 8.9–10.3)
Chloride: 97 mmol/L — ABNORMAL LOW (ref 98–111)
Creatinine, Ser: 0.81 mg/dL (ref 0.44–1.00)
GFR calc Af Amer: >60 mL/min (ref >60)
GFR calc non Af Amer: >60 mL/min (ref >60)
Glucose, Bld: 97 mg/dL (ref 70–99)
Potassium: 3.9 mmol/L (ref 3.5–5.1)
Sodium: 135 mmol/L (ref 135–145)
Total Bilirubin: 0.6 mg/dL (ref 0.3–1.2)
Total Protein: 7.4 g/dL (ref 6.5–8.1)

## 2019-01-27 LAB — PHOSPHORUS: Phosphorus: 3.9 mg/dL (ref 2.5–4.6)

## 2019-01-27 LAB — MAGNESIUM: Magnesium: 1 mg/dL — ABNORMAL LOW (ref 1.7–2.4)

## 2019-01-27 IMAGING — MR MR HEAD W/O CM
10 of 11 series · 38 of 48 positions shown · non-contrast
Comparison: None available.

CLINICAL DATA: Initial evaluation for acute seizure. History of
metastatic lung cancer.

EXAM:
MRI HEAD WITHOUT CONTRAST
TECHNIQUE: Multiplanar, multiecho pulse sequences of the brain and surrounding
structures were obtained without intravenous contrast.

[Series 3: T1 · sagittal · 5.0mm · 0.47mm/px · 1 of 19 slices shown (1 of 2)]
[im 1/19]
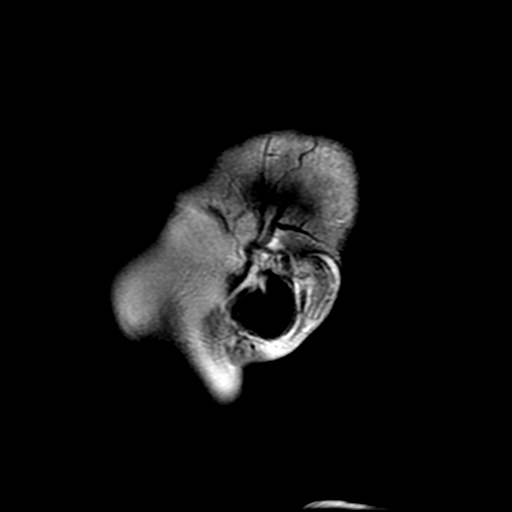

[Series 4: DWI · axial · 3.0mm · 1.09mm/px · z∈[-18,+124]mm · 9 of 98 slices shown (1 of 4)]
[im 1/98]
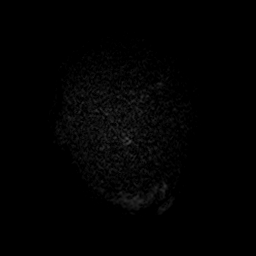
[im 13/98]
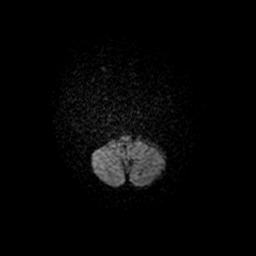
[im 25/98]
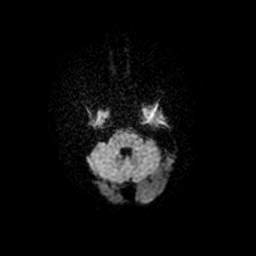
[im 37/98]
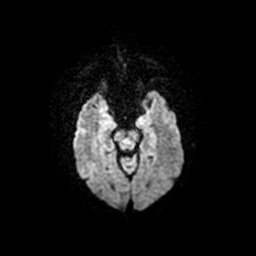
[im 49/98]
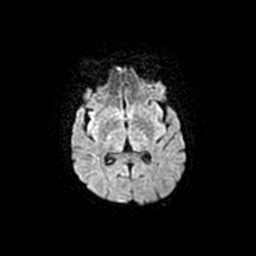
[im 61/98]
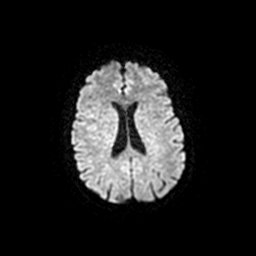
[im 73/98]
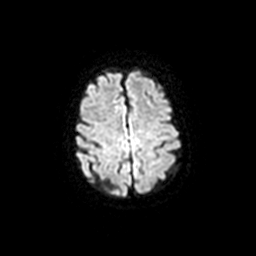
[im 85/98]
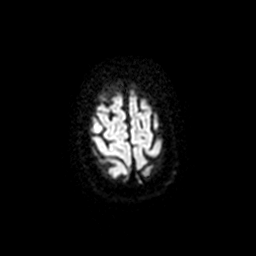
[im 98/98]
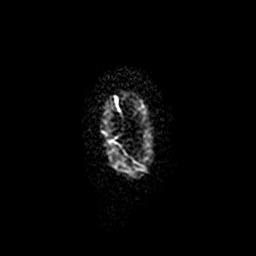

[Series 5: T2 · axial · 5.0mm · 0.86mm/px · z∈[-24,+121]mm · 2 of 22 slices shown (1 of 2)]
[im 1/22]
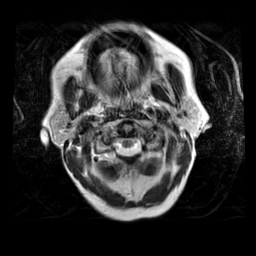
[im 22/22]
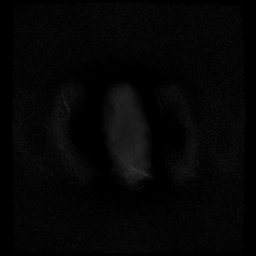

[Series 6: FLAIR · axial · 5.0mm · 0.86mm/px · z∈[-26,+122]mm · 2 of 26 slices shown (1 of 2)]
[im 1/26]
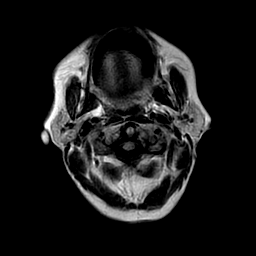
[im 26/26]
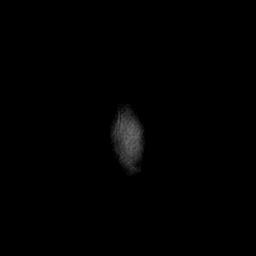

[Series 7: T1 · axial · 1.0mm · 0.47mm/px · z∈[-18,+55]mm · 5 of 138 slices shown (2 of 2)]
[im 1/138]
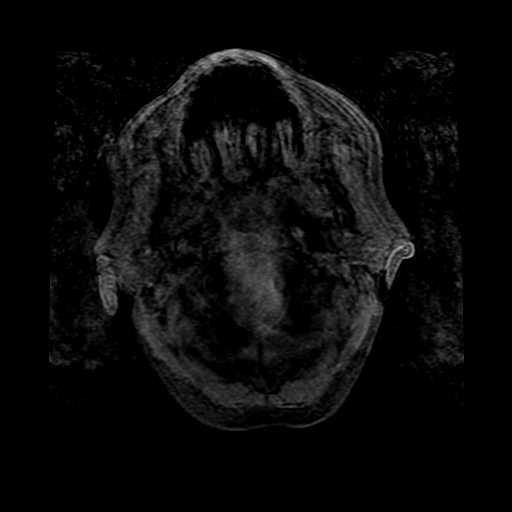
[im 25/138]
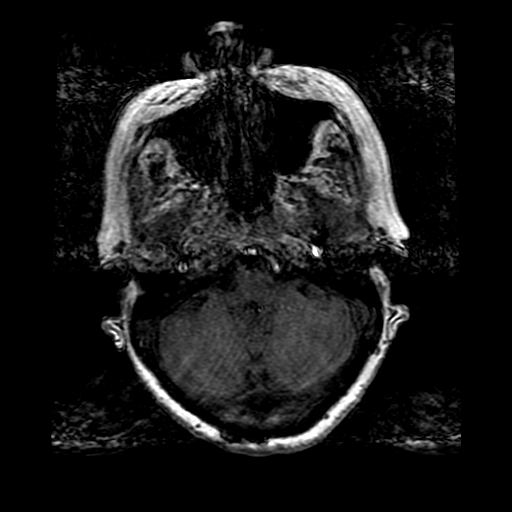
[im 38/138]
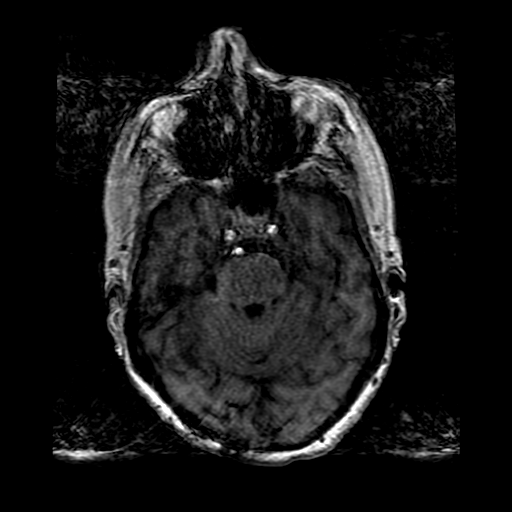
[im 63/138]
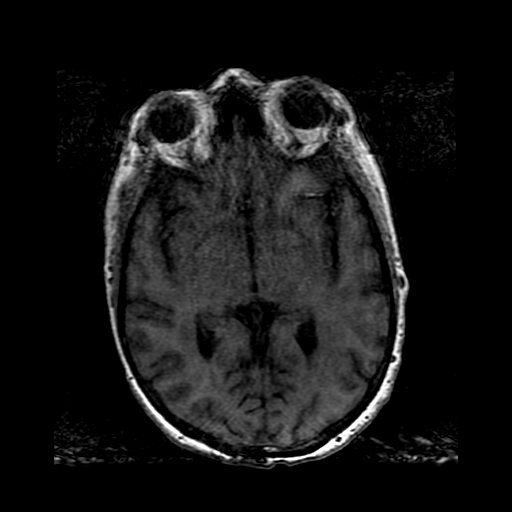
[im 75/138]
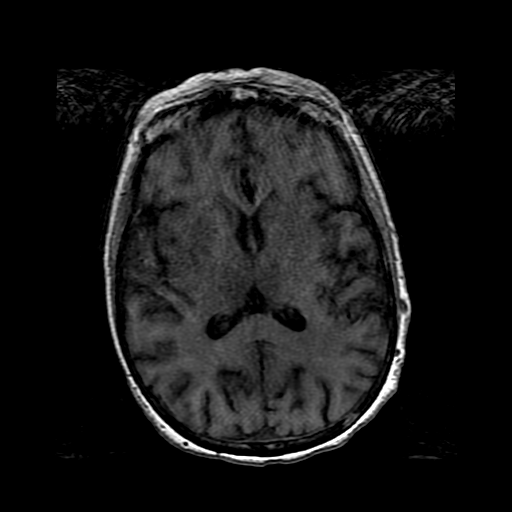

[Series 8: T2 · coronal · 3.0mm · 0.70mm/px · 3 of 33 slices shown (2 of 2)]
[im 1/33]
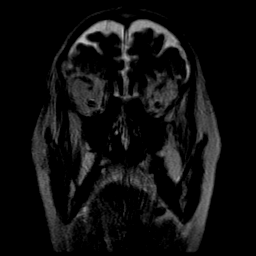
[im 17/33]
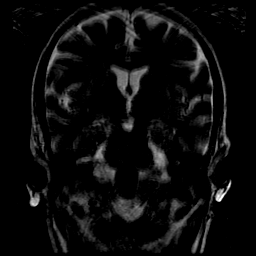
[im 33/33]
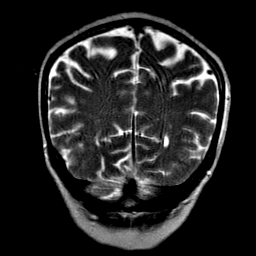

[Series 9: FLAIR · coronal · 3.0mm · 0.35mm/px · 3 of 33 slices shown (2 of 2)]
[im 1/33]
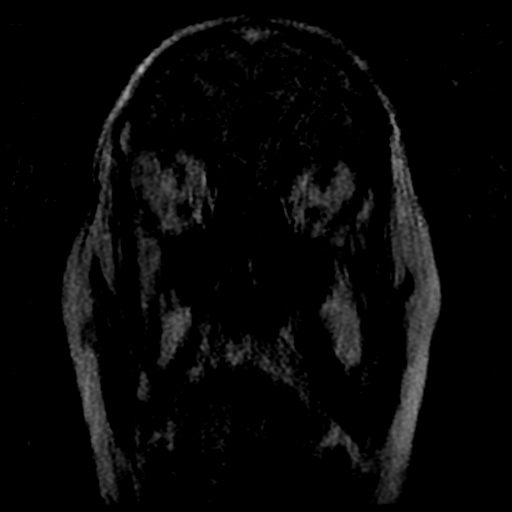
[im 17/33]
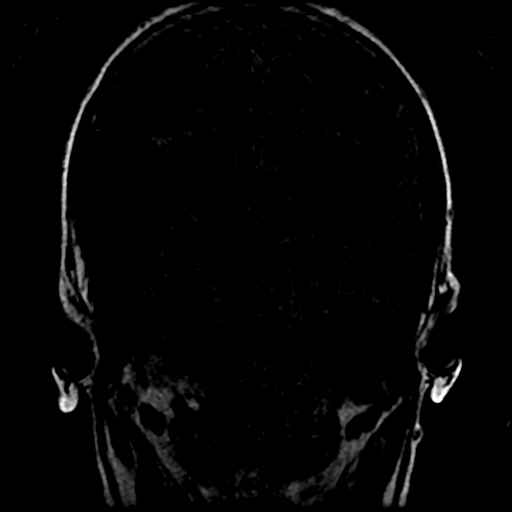
[im 33/33]
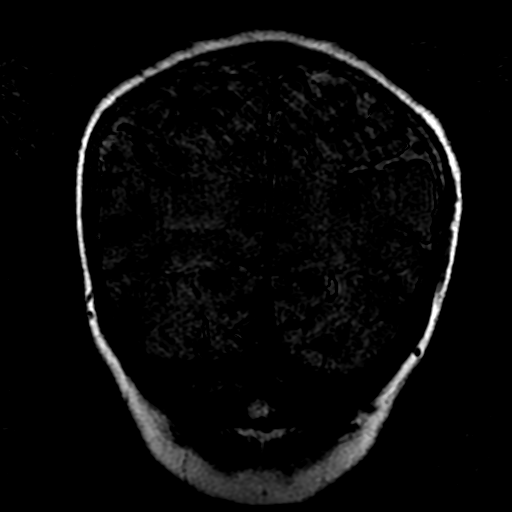

[Series 10: DWI · coronal · 5.0mm · 1.09mm/px · 6 of 66 slices shown (2 of 4)]
[im 1/66]
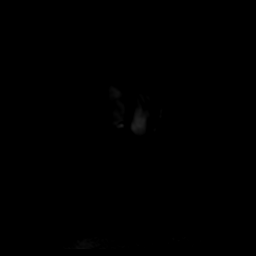
[im 14/66]
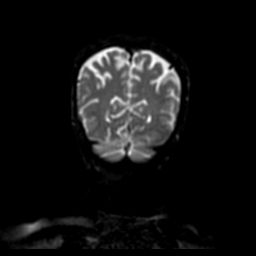
[im 27/66]
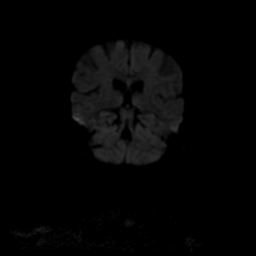
[im 40/66]
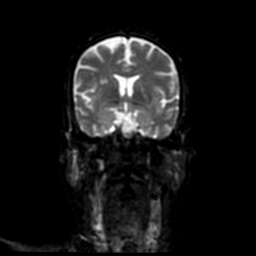
[im 53/66]
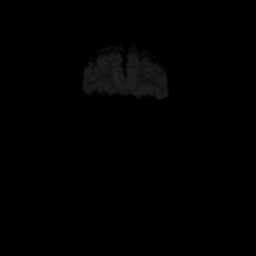
[im 66/66]
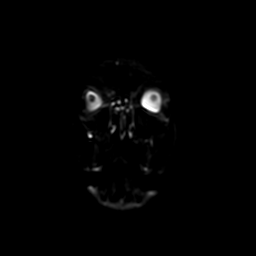

[Series 400: DWI · axial · 3.0mm · 1.09mm/px · z∈[-18,+124]mm · 4 of 47 slices shown (3 of 4)]
[im 1/47]
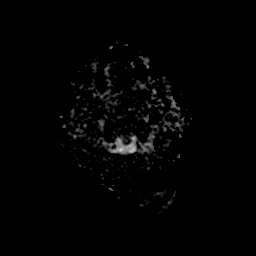
[im 16/47]
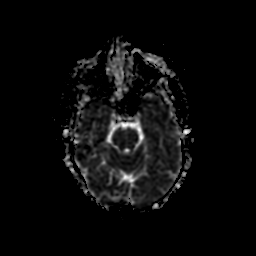
[im 31/47]
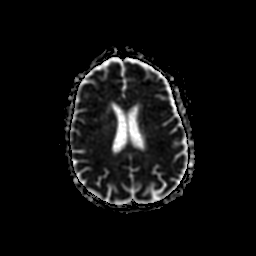
[im 47/47]
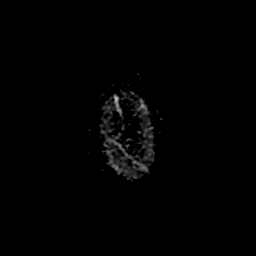

[Series 1000: DWI · coronal · 5.0mm · 1.09mm/px · 3 of 33 slices shown (4 of 4)]
[im 1/33]
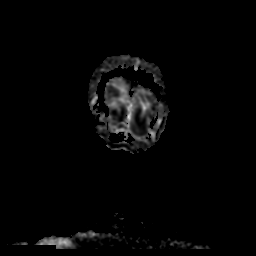
[im 17/33]
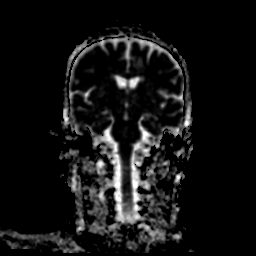
[im 33/33]
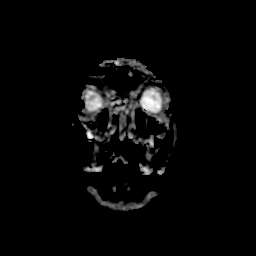

[38 of 48 positions shown; findings below may reference images not displayed]

FINDINGS: Brain: Examination limited by motion artifact and lack of IV
contrast.

Generalized age-related cerebral volume loss. Patchy T2/FLAIR
hyperintensity within the periventricular and deep white matter both
cerebral hemispheres noted, nonspecific, but most commonly related
to chronic small vessel ischemic disease, mild in nature. No
evidence for acute or subacute infarct. Gray-white matter
differentiation maintained. No evidence for acute or chronic
intracranial hemorrhage.

2.6 cm area of increased T2/FLAIR signal abnormality involving the
parasagittal posterior left frontal region likely related to
underlying metastatic lesion (series 6, image 20). Exact
measurements of the underlying lesion limited due to lack of IV
contrast. No significant regional mass effect. Postoperative changes
from prior right occipital craniotomy noted. Underlying FLAIR signal
abnormality could reflect postoperative gliosis and/or
encephalomalacia. Residual tumor within this region not excluded. No
other definite visible intracranial mass lesions identified on this
noncontrast examination. No midline shift or significant mass
effect. No hydrocephalus. No extra-axial fluid collection.

Vascular: Major intracranial vascular flow voids grossly maintained
at the skull base.

Skull and upper cervical spine: Craniocervical junction within
normal limits. Upper cervical spine normal. Bone marrow signal
intensity within normal limits. Prior right occipital craniotomy. 11
mm lesion involving the left occipital calvarium noted (series 7,
image 75), indeterminate. No other focal marrow replacing lesions.

Sinuses/Orbits: Globes and orbital soft tissues within normal
limits. Paranasal sinuses are clear. No mastoid effusion.

Other: None.
IMPRESSION: 1. Limited study due to motion artifact and lack of IV contrast.
2. 2.6 cm area of increased T2/FLAIR signal abnormality involving
the parasagittal posterior left frontal region, likely vasogenic
edema related to underlying metastatic disease. No significant
regional mass effect.
3. Postoperative changes from prior right occipital craniotomy.
Underlying FLAIR signal abnormality could reflect postoperative
gliosis and/or encephalomalacia. Residual tumor not excluded. No
other visible intracranial metastatic lesions identified on this
noncontrast examination.
4. 11 mm lesion involving the left occipital calvarium,
indeterminate. Attention at follow-up recommended.
5. Underlying atrophy with chronic small vessel ischemic disease. No
other acute intracranial abnormality.

## 2019-01-27 IMAGING — DX DG CHEST 1V PORT
1 series · 1 of 1 positions shown · non-contrast
Comparison: Portable exam [CN] hours compared to [DATE]

CLINICAL DATA: Stage IV lung and brain cancer, RIGHT arm and RIGHT
leg trembling, spasms, generalized pain, epistaxis yesterday for 6
hours, on blood thinners, history asthma, COPD, GERD, hypertension

EXAM:
PORTABLE CHEST 1 VIEW

[chest ap]
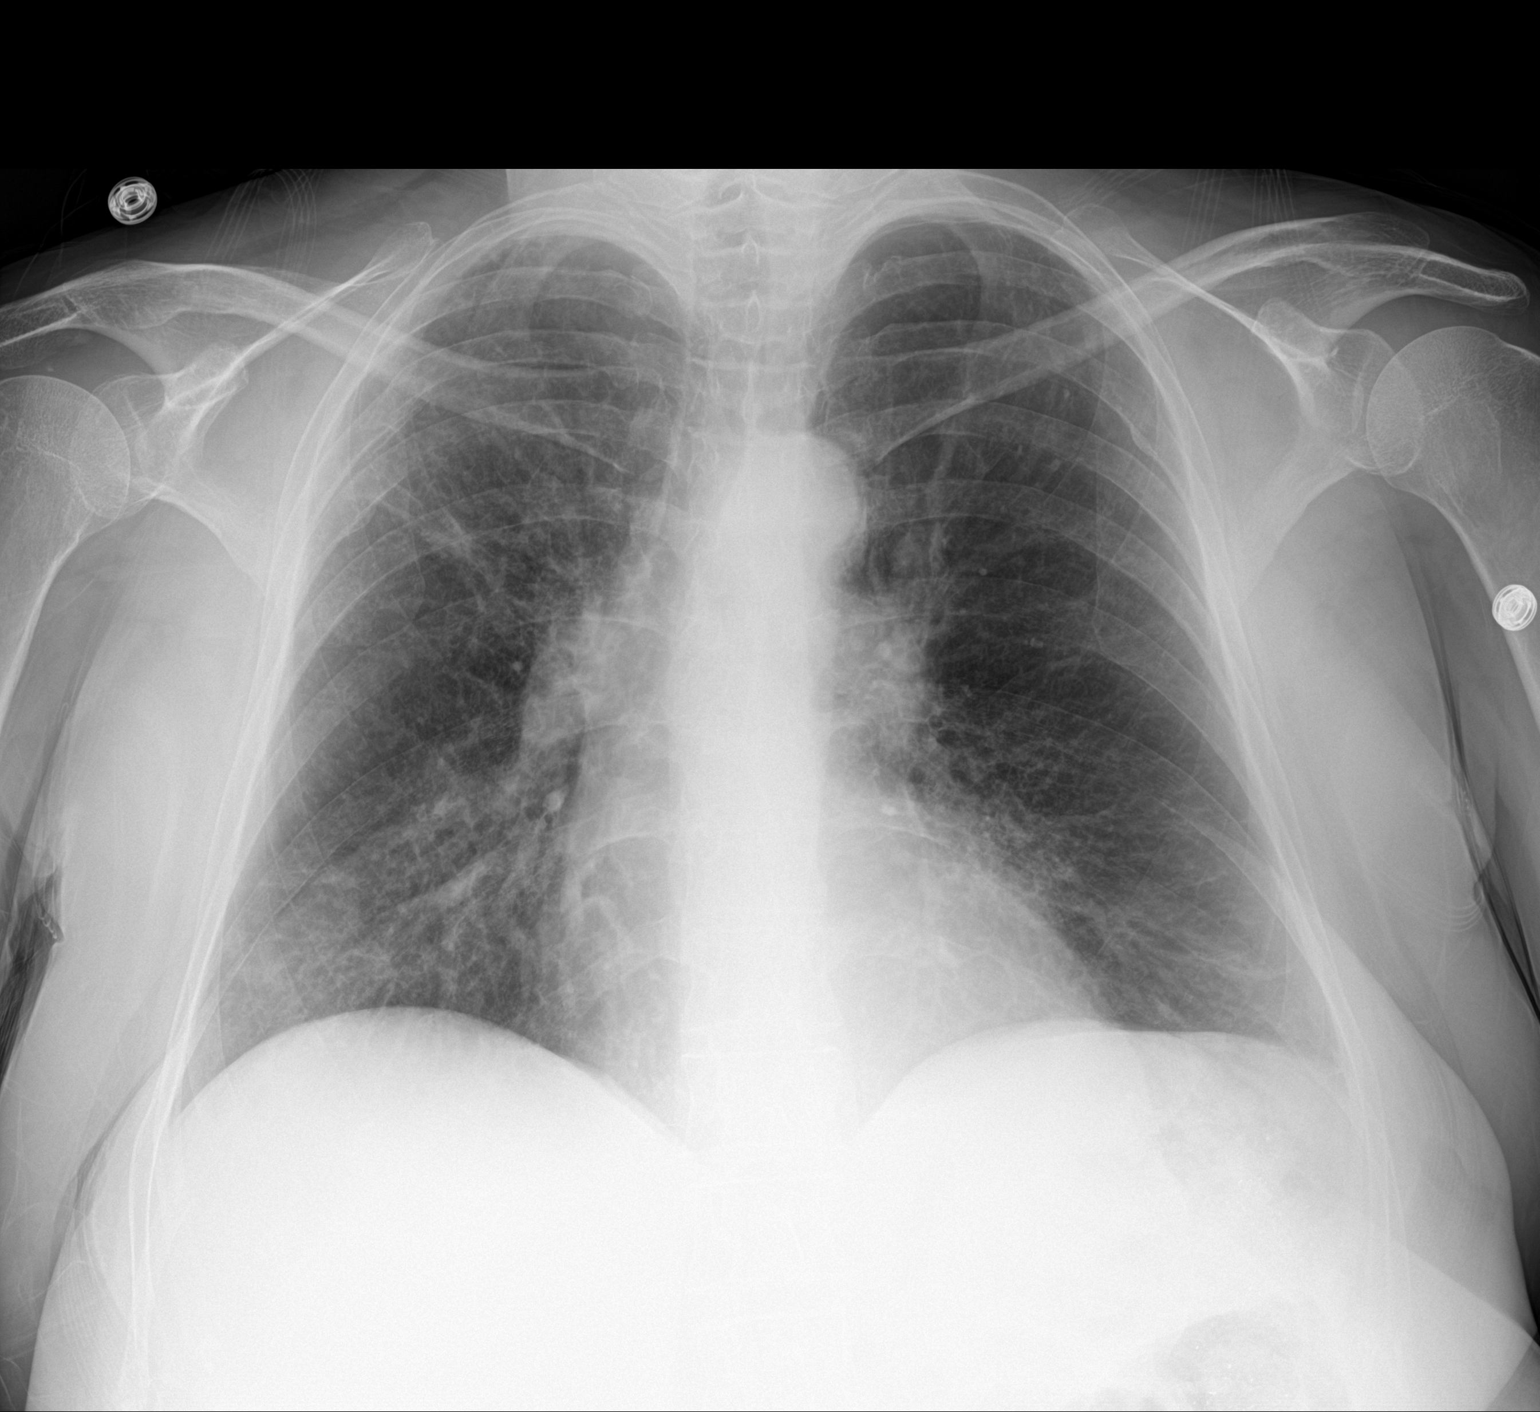

[1 of 1 positions shown; findings below may reference images not displayed]

FINDINGS: Normal heart size, mediastinal contours, and pulmonary vascularity.

Emphysematous and bronchitic changes consistent with COPD.

Minimal atelectasis in RIGHT upper lobe and LEFT base.

RIGHT basilar infiltrate question pneumonia.

Remaining lungs clear.

No pleural effusion or pneumothorax.

Bones demineralized.
IMPRESSION: Changes of COPD with scattered atelectasis and mild RIGHT basilar
infiltrate question pneumonia.

## 2019-01-27 MED ORDER — MAGNESIUM SULFATE IN D5W 1-5 GM/100ML-% IV SOLN
1.0000 g | Freq: Once | INTRAVENOUS | Status: AC
  Administered 2019-01-28: 1 g via INTRAVENOUS
  Filled 2019-01-27: qty 100

## 2019-01-27 MED ORDER — OXYCODONE HCL ER 40 MG PO T12A
40.0000 mg | EXTENDED_RELEASE_TABLET | Freq: Two times a day (BID) | ORAL | Status: DC
  Administered 2019-01-27: 40 mg via ORAL
  Filled 2019-01-27 (×2): qty 4

## 2019-01-27 MED ORDER — LEVETIRACETAM IN NACL 1000 MG/100ML IV SOLN
1000.0000 mg | Freq: Once | INTRAVENOUS | Status: AC
  Administered 2019-01-27: 1000 mg via INTRAVENOUS
  Filled 2019-01-27: qty 100

## 2019-01-27 MED ORDER — MAGNESIUM SULFATE 50 % IJ SOLN: 1.0000 g | Freq: Once | INTRAMUSCULAR | Status: DC

## 2019-01-27 MED ORDER — DIAZEPAM 2 MG PO TABS
2.0000 mg | ORAL_TABLET | Freq: Once | ORAL | Status: AC
  Administered 2019-01-27: 2 mg via ORAL
  Filled 2019-01-27: qty 1

## 2019-01-27 NOTE — ED Notes (Signed)
ED Provider at bedside. 

## 2019-01-27 NOTE — ED Triage Notes (Signed)
Pt reports that she has stage 4 brain and lung cancer. Pt reports low platelets and abnormal labs per pt. Pt presents c/o Rt arm and Rt leg trembling, and spasms. Pt reports that she came in on 01/25/19 for similar symptoms. Pt reports that she was admitted and was discharged yesterday. Pt c/o of generalized pain. Pt reports symptoms are intermittent. Pt reports h/o of bloody nose yesterday for 6 hours and is on blood thinners.

## 2019-01-27 NOTE — ED Notes (Signed)
CRITICAL VALUE STICKER  CRITICAL VALUE: WBC 1.2  RECEIVER (on-site recipient of call):  DATE & TIME NOTIFIED: 12 04 20  [id number]  MESSENGER (representative from lab): Nunzio Cory   MD NOTIFIED: Francia Greaves MD  [place]: [phone]

## 2019-01-27 NOTE — ED Provider Notes (Signed)
[place] DEPT Provider Note   CSN: [id number] Arrival date & time: 01/27/19  1736     History   Chief Complaint Chief Complaint  Patient presents with  . arm spasms  . leg spasms  . Cancer Patient  . Tremors    HPI Ashley Mayo is a 53 y.o. female.     53 year old female with prior medical history as detailed below presents for evaluation of recurrent spasms and tremors of the right arm and right leg.  Patient reports recent admission with discharge yesterday from this facility for similar complaint.  Prior admission treated her metabolic derangements (hypomagnesia, hypocalcemia) and the patient reported that she is feeling better when she was discharged yesterday.  Over the course of today she reports multiple episodes of shaking tremors of the right arm and leg.  Symptoms last approximately a minute and a half.  She denies other seizure-like episode.  She denies prior history of known seizure.  She does report a history of brain mets with metastatic lung cancer.  Per oncology care is primarily at [facility].  MRI of the brain performed most recently at [facility] showed right occipital lobe and left frontal lobe lesions.  The history is provided by the patient and medical records.  Illness Location:  "shaking tremor of right arm and right leg" Severity:  Mild Onset quality:  Gradual Duration:  3 days Timing:  Constant Progression:  Worsening Chronicity:  New   Past Medical History:  Diagnosis Date  . Anxiety   . Asthma   . Cancer ([place])    lung cancer  . COPD (chronic obstructive pulmonary disease) ([place])   . Depression   . GERD (gastroesophageal reflux disease)   . Hypertension   . Migraine     Patient Active Problem List   Diagnosis Date Noted  . ADHD 01/27/2019  . History of pulmonary embolism 01/27/2019  . Focal seizure ([place]) 01/27/2019  . Hypomagnesemia 01/25/2019  . Symptomatic anemia   . Thrombocytopenia ([place])   . Leukopenia    . Perforated ear drum, bilateral 01/25/2016  . Pneumonia 01/23/2016  . Healthcare-associated pneumonia 01/23/2016  . Hypokalemia 01/23/2016  . Anxiety 01/23/2016  . Depression 01/23/2016  . Metastatic lung cancer (metastasis from lung to other site) ([place]) 01/23/2016  . Brain metastasis ([place]) 01/23/2016  . Essential hypertension 01/23/2016    Past Surgical History:  Procedure Laterality Date  . ABDOMINAL HYSTERECTOMY    . CESAREAN SECTION    . ORIF right hip fracture       OB History   No obstetric history on file.      Home Medications    Prior to Admission medications   Medication Sig Start Date End Date Taking? Authorizing Provider  ADDERALL XR 30 MG 24 hr capsule Take 30 mg by mouth daily.   Yes [provider]  albuterol (PROVENTIL HFA;VENTOLIN HFA) 108 (90 Base) MCG/ACT inhaler Inhale 1-2 puffs into the lungs every 6 (six) hours as needed for wheezing or shortness of breath.   Yes [provider]  albuterol (PROVENTIL) (2.5 MG/3ML) 0.083% nebulizer solution Take 2.5 mg by nebulization every 6 (six) hours as needed for wheezing or shortness of breath.    Yes [provider]  amphetamine-dextroamphetamine (ADDERALL) 10 MG tablet Take 10 mg by mouth every evening.   Yes [provider]  diphenhydrAMINE (BENADRYL) 25 MG tablet Take 25 mg by mouth at bedtime as needed for sleep.   Yes [provider]  esomeprazole (  NEXIUM) 40 MG capsule Take 40 mg by mouth daily. 12/02/17  Yes [provider]  furosemide (LASIX) 20 MG tablet Take 20 mg by mouth daily as needed for fluid.    Yes [provider]  lidocaine (LIDODERM) 5 % Place 1-2 patches onto the skin daily as needed (for back pain).    Yes [provider]  lidocaine (XYLOCAINE) 2 % solution Use as directed 15 mLs in the mouth or throat every 6 (six) hours as needed for mouth pain.  11/24/18  Yes [provider]  Magnesium 200 MG TABS Take 1 tablet  (200 mg total) by mouth daily. 01/26/19  Yes Dahal, Marlowe Aschoff, MD  metoprolol succinate (TOPROL-XL) 25 MG 24 hr tablet Take 25 mg by mouth daily.   Yes [provider]  nystatin (MYCOSTATIN) 100000 UNIT/ML suspension Take 5 mLs (500,000 Units total) by mouth 4 (four) times daily. 12/30/17  Yes Tacy Learn, PA-C  OLANZapine (ZYPREXA) 5 MG tablet Take 5 mg by mouth 2 (two) times daily.   Yes [provider]  ondansetron (ZOFRAN) 8 MG tablet Take 8 mg by mouth every 8 (eight) hours as needed for nausea or vomiting.  11/01/18  Yes [provider]  OXYCONTIN 20 MG 12 hr tablet Take 20 mg by mouth every 8 (eight) hours. 12/20/17  Yes [provider]  potassium chloride (MICRO-K) 10 MEQ CR capsule Take 10 mEq by mouth 2 (two) times daily.  01/06/19  Yes [provider]  SUMAtriptan (IMITREX) 6 MG/0.5ML SOLN injection Inject 6 mg into the skin every 2 (two) hours as needed for migraine.  11/30/18  Yes [provider]  valACYclovir (VALTREX) 500 MG tablet Take 500 mg by mouth 2 (two) times daily as needed (cold sores).    Yes [provider]    Family History Family History  Problem Relation Age of Onset  . Breast cancer Mother     Social History Social History   Tobacco Use  . Smoking status: Former Smoker    Packs/day: 0.25    Years: 35.00    Pack years: 8.75    Types: Cigarettes  . Smokeless tobacco: Never Used  Substance Use Topics  . Alcohol use: No  . Drug use: No     Allergies   Codeine and Shellfish allergy   Review of Systems Review of Systems  All other systems reviewed and are negative.    Physical Exam Updated Vital Signs BP (!) 131/118   Pulse (!) 104   Temp 98.2 F (36.8 C) (Oral)   Resp 19   Ht 5\' 4"  (1.626 m)   Wt 59.9 kg   SpO2 98%   BMI 22.66 kg/m   Physical Exam Vitals signs and nursing note reviewed.  Constitutional:      General: She is not in acute distress.    Appearance: Normal  appearance. She is well-developed.  HENT:     Head: Normocephalic and atraumatic.  Eyes:     Conjunctiva/sclera: Conjunctivae normal.     Pupils: Pupils are equal, round, and reactive to light.  Neck:     Musculoskeletal: Normal range of motion and neck supple.  Cardiovascular:     Rate and Rhythm: Normal rate and regular rhythm.     Heart sounds: Normal heart sounds.  Pulmonary:     Effort: Pulmonary effort is normal. No respiratory distress.     Breath sounds: Normal breath sounds.  Abdominal:     General: There is no  distension.     Palpations: Abdomen is soft.     Tenderness: There is no abdominal tenderness.  Musculoskeletal: Normal range of motion.        General: No deformity.  Skin:    General: Skin is warm and dry.  Neurological:     Mental Status: She is alert and oriented to person, place, and time.      ED Treatments / Results  Labs (all labs ordered are listed, but only abnormal results are displayed) Labs Reviewed  COMPREHENSIVE METABOLIC PANEL - Abnormal; Notable for the following components:      Result Value   Chloride 97 (*)    Calcium 8.5 (*)    AST 51 (*)    ALT 98 (*)    Alkaline Phosphatase 175 (*)    All other components within normal limits  CBC WITH DIFFERENTIAL/PLATELET - Abnormal; Notable for the following components:   WBC 1.2 (*)    RBC 3.02 (*)    Hemoglobin 9.9 (*)    HCT 29.9 (*)    RDW 20.0 (*)    Platelets 36 (*)    Neutro Abs 0.5 (*)    Lymphs Abs 0.5 (*)    All other components within normal limits  MAGNESIUM - Abnormal; Notable for the following components:   Magnesium 1.0 (*)    All other components within normal limits  PHOSPHORUS    EKG EKG Interpretation  Date/Time:  Friday January 27 2019 19:38:42 EST Ventricular Rate:  98 PR Interval:    QRS Duration: 82 QT Interval:  367 QTC Calculation: 469 R Axis:   23 Text Interpretation: Sinus rhythm Multiple ventricular premature complexes Baseline wander in lead(s) V6  Confirmed by Dene Gentry [phone]) on 01/27/2019 8:00:32 PM   Radiology Dg Chest Port 1 View  Result Date: 01/27/2019 CLINICAL DATA:  Stage IV lung and brain cancer, RIGHT arm and RIGHT leg trembling, spasms, generalized pain, epistaxis yesterday for 6 hours, on blood thinners, history asthma, COPD, GERD, hypertension EXAM: PORTABLE CHEST 1 VIEW COMPARISON:  Portable exam 1820 hours compared to 01/25/2019 FINDINGS: Normal heart size, mediastinal contours, and pulmonary vascularity. Emphysematous and bronchitic changes consistent with COPD. Minimal atelectasis in RIGHT upper lobe and LEFT base. RIGHT basilar infiltrate question pneumonia. Remaining lungs clear. No pleural effusion or pneumothorax. Bones demineralized. IMPRESSION: Changes of COPD with scattered atelectasis and mild RIGHT basilar infiltrate question pneumonia. Electronically Signed   By: Lavonia Dana M.D.   On: 01/27/2019 19:07    Procedures Procedures (including critical care time)  Medications Ordered in ED Medications  oxyCODONE (OXYCONTIN) 12 hr tablet 40 mg (40 mg Oral Given 01/27/19 2005)  magnesium sulfate IVPB 1 g 100 mL (has no administration in time range)  diazepam (VALIUM) tablet 2 mg (2 mg Oral Given 01/27/19 2006)  levETIRAcetam (KEPPRA) IVPB 1000 mg/100 mL premix (0 mg Intravenous Stopped 01/27/19 2155)     Initial Impression / Assessment and Plan / ED Course  I have reviewed the triage vital signs and the nursing notes.  Pertinent labs & imaging results that were available during my care of the patient were reviewed by me and considered in my medical decision making (see chart for details).        MDM  Screen complete  Ashley Mayo was evaluated in Emergency Department on 01/27/2019 for the symptoms described in the history of present illness. She was evaluated in the context of the global COVID-19 pandemic, which necessitated consideration that the patient might be  at risk for infection with the  SARS-CoV-2 virus that causes COVID-19. Institutional protocols and algorithms that pertain to the evaluation of patients at risk for COVID-19 are in a state of rapid change based on information released by regulatory bodies including the CDC and federal and state organizations. These policies and algorithms were followed during the patient's care in the ED.   Patient is presenting for evaluation of shaking tremors of the right arm and leg.  Given patient's prior history of metastatic brain disease I am strongly suspicious of possible seizure-like activity.  Discussed with neurology (Dr Aroor).  He recommends Keppra load and repeat MRI of the brain.  Observation admission with the hospitalist service recommended.  Hospitalist service (Dr Si Raider) is aware of case and will evaluate for admission.  Final Clinical Impressions(s) / ED Diagnoses   Final diagnoses:  Seizure [facility])    ED Discharge Orders    None       Valarie Merino, MD 01/27/19 2207

## 2019-01-27 NOTE — ED Notes (Signed)
Francia Greaves, M.D.

## 2019-01-27 NOTE — H&P (Signed)
History and Physical    Ashley Mayo ZOX:[id number] DOB: [birth year] DOA: 01/27/2019  PCP: System, Pcp Not In  Patient coming from: home   Chief Complaint: uncontrollable shaking of right and left leg   HPI: Ashley Mayo is a 53 y.o. female with medical history significant for lung cancer metastatic to brain, spine, and liver; hypertension; copd; adhd; provoked PE currently on lovenox, presenting with above.  Presented with this problem two days ago, thought to be 2/2 severe electrolyte imbalance, namely hypomagenesemia. Also found to be pancytopenic that admission, treated with IV Mg as well as  2 u prbcs and 1 u platelets.  Problem began several days ago. Experiences repeated bouts of uncontrollable shaking of right arm and right leg. Shakes so hard it is painful. No loss of consciousness. New problem, denies history of seizures. Denies fever. Does have intermittent nose bleeds, denies other bleeding. Does endorse more cough than baseline, perhaps slightly worsened sob. Denies fevers. Denies recent med changes. Last chemo about 3 weeks ago, receives every 4 weeks.   ED Course: labs, neuro consult, keppra load  Review of Systems: As per HPI otherwise 10 point review of systems negative.    Past Medical History:  Diagnosis Date  . Anxiety   . Asthma   . Cancer ([place])    lung cancer  . COPD (chronic obstructive pulmonary disease) ([place])   . Depression   . GERD (gastroesophageal reflux disease)   . Hypertension   . Migraine     Past Surgical History:  Procedure Laterality Date  . ABDOMINAL HYSTERECTOMY    . CESAREAN SECTION    . ORIF right hip fracture       reports that she has quit smoking. Her smoking use included cigarettes. She has a 8.75 pack-year smoking history. She has never used smokeless tobacco. She reports that she does not drink alcohol or use drugs.  Allergies  Allergen Reactions  . Codeine Hives  . Shellfish Allergy Hives    Family History  Problem  Relation Age of Onset  . Breast cancer Mother     Prior to Admission medications   Medication Sig Start Date End Date Taking? Authorizing Provider  ADDERALL XR 30 MG 24 hr capsule Take 30 mg by mouth daily.   Yes [provider]  albuterol (PROVENTIL HFA;VENTOLIN HFA) 108 (90 Base) MCG/ACT inhaler Inhale 1-2 puffs into the lungs every 6 (six) hours as needed for wheezing or shortness of breath.   Yes [provider]  albuterol (PROVENTIL) (2.5 MG/3ML) 0.083% nebulizer solution Take 2.5 mg by nebulization every 6 (six) hours as needed for wheezing or shortness of breath.    Yes [provider]  amphetamine-dextroamphetamine (ADDERALL) 10 MG tablet Take 10 mg by mouth every evening.   Yes [provider]  diphenhydrAMINE (BENADRYL) 25 MG tablet Take 25 mg by mouth at bedtime as needed for sleep.   Yes [provider]  esomeprazole (NEXIUM) 40 MG capsule Take 40 mg by mouth daily. 12/02/17  Yes [provider]  furosemide (LASIX) 20 MG tablet Take 20 mg by mouth daily as needed for fluid.    Yes [provider]  lidocaine (LIDODERM) 5 % Place 1-2 patches onto the skin daily as needed (for back pain).    Yes [provider]  lidocaine (XYLOCAINE) 2 % solution Use as directed 15 mLs in the mouth or throat every 6 (six) hours as needed for mouth pain.  11/24/18  Yes [provider]  Magnesium 200 MG TABS Take 1 tablet (200 mg total) by mouth daily. 01/26/19  Yes Dahal, Marlowe Aschoff, MD  metoprolol succinate (TOPROL-XL) 25 MG 24 hr tablet Take 25 mg by mouth daily.   Yes [provider]  nystatin (MYCOSTATIN) 100000 UNIT/ML suspension Take 5 mLs (500,000 Units total) by mouth 4 (four) times daily. 12/30/17  Yes Tacy Learn, PA-C  OLANZapine (ZYPREXA) 5 MG tablet Take 5 mg by mouth 2 (two) times daily.   Yes [provider]  ondansetron (ZOFRAN) 8 MG tablet Take 8 mg by mouth every 8 (eight) hours as needed for  nausea or vomiting.  11/01/18  Yes [provider]  OXYCONTIN 20 MG 12 hr tablet Take 20 mg by mouth every 8 (eight) hours. 12/20/17  Yes [provider]  potassium chloride (MICRO-K) 10 MEQ CR capsule Take 10 mEq by mouth 2 (two) times daily.  01/06/19  Yes [provider]  SUMAtriptan (IMITREX) 6 MG/0.5ML SOLN injection Inject 6 mg into the skin every 2 (two) hours as needed for migraine.  11/30/18  Yes [provider]  valACYclovir (VALTREX) 500 MG tablet Take 500 mg by mouth 2 (two) times daily as needed (cold sores).    Yes [provider]    Physical Exam: Vitals:   01/27/19 1743 01/27/19 1757 01/27/19 2000  BP: (!) 163/119  (!) 131/118  Pulse: (!) 128  (!) 104  Resp: 18  19  Temp: 98.2 F (36.8 C)    TempSrc: Oral    SpO2: 98%  98%  Weight:  59.9 kg   Height:  5\' 4"  (1.626 m)     Constitutional: No acute distress Head: Atraumatic Eyes: Conjunctiva clear ENM: Moist mucous membranes. Normal dentition.  Neck: Supple Respiratory: scattered rhonchi Cardiovascular: Regular rate and rhythm. No murmurs/rubs/gallops. Abdomen: Non-tender, non-distended. No masses. No rebound or guarding. Positive bowel sounds. Musculoskeletal: No joint deformity upper and lower extremities. Normal ROM, no contractures. Normal muscle tone.  Skin: No rashes, lesions, or ulcers.  Extremities: No peripheral edema. Palpable peripheral pulses. Neurologic: Alert, moving all 4 extremities. Psychiatric: Normal insight and judgement.   Labs on Admission: I have personally reviewed following labs and imaging studies  CBC: Recent Labs  Lab 01/25/19 1244 01/26/19 0725 01/27/19 1956  WBC 1.8* 1.2* 1.2*  NEUTROABS 0.8*  --  0.5*  HGB 6.2* 9.0* 9.9*  HCT 18.9* 26.7* 29.9*  MCV 106.8* 99.6 99.0  PLT 6* 49* 36*   Basic Metabolic Panel: Recent Labs  Lab 01/25/19 1244 01/25/19 1631 01/26/19 0725 01/27/19 1956  NA 136 139 138 135  K 3.6 3.0* 3.6 3.9  CL  101 103 102 97*  CO2 23 27 26 26   GLUCOSE 89 81 95 97  BUN 19 16 14 19   CREATININE 0.66 0.73 0.60 0.81  CALCIUM 6.2* 6.9* 7.1* 8.5*  MG 0.4* 2.1  --  1.0*  PHOS  --   --   --  3.9   GFR: Estimated Creatinine Clearance: 68.6 mL/min (by C-G formula based on SCr of 0.81 mg/dL). Liver Function Tests: Recent Labs  Lab 01/25/19 1244 01/25/19 1631 01/26/19 0725 01/27/19 1956  AST 87* 89* 70* 51*  ALT 129* 133* 110* 98*  ALKPHOS 128* 123 129* 175*  BILITOT 0.4 0.5 0.6 0.6  PROT 6.1* 6.4* 6.0* 7.4  ALBUMIN 3.2* 3.5 3.2* 3.9   No results for input(s): LIPASE, AMYLASE in the last 168 hours. No results for input(s): AMMONIA in the last 168 hours. Coagulation  Profile: No results for input(s): INR, PROTIME in the last 168 hours. Cardiac Enzymes: No results for input(s): CKTOTAL, CKMB, CKMBINDEX, TROPONINI in the last 168 hours. BNP (last 3 results) No results for input(s): PROBNP in the last 8760 hours. HbA1C: No results for input(s): HGBA1C in the last 72 hours. CBG: No results for input(s): GLUCAP in the last 168 hours. Lipid Profile: No results for input(s): CHOL, HDL, LDLCALC, TRIG, CHOLHDL, LDLDIRECT in the last 72 hours. Thyroid Function Tests: No results for input(s): TSH, T4TOTAL, FREET4, T3FREE, THYROIDAB in the last 72 hours. Anemia Panel: No results for input(s): VITAMINB12, FOLATE, FERRITIN, TIBC, IRON, RETICCTPCT in the last 72 hours. Urine analysis:    Component Value Date/Time   COLORURINE YELLOW 05/12/2016 0815   APPEARANCEUR TURBID (A) 05/12/2016 0815   LABSPEC 1.019 05/12/2016 0815   PHURINE 6.0 05/12/2016 0815   GLUCOSEU NEGATIVE 05/12/2016 0815   HGBUR MODERATE (A) 05/12/2016 0815   BILIRUBINUR NEGATIVE 05/12/2016 0815   KETONESUR NEGATIVE 05/12/2016 0815   PROTEINUR 100 (A) 05/12/2016 0815   NITRITE NEGATIVE 05/12/2016 0815   LEUKOCYTESUR LARGE (A) 05/12/2016 0815    Radiological Exams on Admission: Dg Chest Port 1 View  Result Date: 01/27/2019  CLINICAL DATA:  Stage IV lung and brain cancer, RIGHT arm and RIGHT leg trembling, spasms, generalized pain, epistaxis yesterday for 6 hours, on blood thinners, history asthma, COPD, GERD, hypertension EXAM: PORTABLE CHEST 1 VIEW COMPARISON:  Portable exam 1820 hours compared to 01/25/2019 FINDINGS: Normal heart size, mediastinal contours, and pulmonary vascularity. Emphysematous and bronchitic changes consistent with COPD. Minimal atelectasis in RIGHT upper lobe and LEFT base. RIGHT basilar infiltrate question pneumonia. Remaining lungs clear. No pleural effusion or pneumothorax. Bones demineralized. IMPRESSION: Changes of COPD with scattered atelectasis and mild RIGHT basilar infiltrate question pneumonia. Electronically Signed   By: Lavonia Dana M.D.   On: 01/27/2019 19:07    EKG: Independently reviewed. 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 Neuro consulted, advised keppra load and MRI. - continue keppra bid - f/u mri and further neuro recs. May also need steroids  # Hypomagnesemia - thought to be 2/2 recent chemo-induced diarrhea, now resolved. Mg 1 on presentation, received 1 g in ED - will continue home mg and order another 2 g IV  # Pancytopenia - wbc 1.2, plts 36, h 9.9, all more or less stable from discharge yesterday, this likely 2/2 malignancy and chemotherapy. No fever. - monitor  # Community acquired pneumonia: cxr with r sided infiltrate. Pt afebrile but does endorse worsened cough from baseline. covid negative yesterday. Normal WOB - will opt to treat w/ ceftriaxone and azithromycin  # History of pulmonary embolism - given plts of 36, holding home lovenox  for now, consider onc consult for guidance regarding risk/benefit of continuing  # Chronic pain - cont home opioids  # htn - here bp wnl - cont home metoprolol  # Adhd - cont home adderall  # Mood disorder - cont home olanzapine  # nicotine abuse - nicotine patch  DVT prophylaxis: scds Code Status: full  Family Communication: daughter  Disposition Plan: tbd  Consults called: neurology  Admission status: med/surg  Desma Maxim MD [facility] Pager [phone]  If 7PM-7AM, please contact night-coverage [email or web address] Password [email or web address]  01/27/2019, 10:05 PM

## 2019-01-28 DIAGNOSIS — D649 Anemia, unspecified: Secondary | ICD-10-CM

## 2019-01-28 DIAGNOSIS — R569 Unspecified convulsions: Secondary | ICD-10-CM

## 2019-01-28 LAB — COMPREHENSIVE METABOLIC PANEL
ALT: 73 U/L — ABNORMAL HIGH (ref 0–44)
AST: 38 U/L (ref 15–41)
Albumin: 3 g/dL — ABNORMAL LOW (ref 3.5–5.0)
Alkaline Phosphatase: 146 U/L — ABNORMAL HIGH (ref 38–126)
Anion gap: 12 (ref 5–15)
BUN: 15 mg/dL (ref 6–20)
CO2: 26 mmol/L (ref 22–32)
Calcium: 8.1 mg/dL — ABNORMAL LOW (ref 8.9–10.3)
Chloride: 97 mmol/L — ABNORMAL LOW (ref 98–111)
Creatinine, Ser: 0.67 mg/dL (ref 0.44–1.00)
GFR calc Af Amer: >60 mL/min (ref >60)
GFR calc non Af Amer: >60 mL/min (ref >60)
Glucose, Bld: 111 mg/dL — ABNORMAL HIGH (ref 70–99)
Potassium: 3.9 mmol/L (ref 3.5–5.1)
Sodium: 135 mmol/L (ref 135–145)
Total Bilirubin: 0.4 mg/dL (ref 0.3–1.2)
Total Protein: 5.9 g/dL — ABNORMAL LOW (ref 6.5–8.1)

## 2019-01-28 LAB — SARS CORONAVIRUS 2 (TAT 6-24 HRS): SARS Coronavirus 2: NEGATIVE

## 2019-01-28 LAB — CBC
HCT: 25.9 % — ABNORMAL LOW (ref 36.0–46.0)
Hemoglobin: 8.5 g/dL — ABNORMAL LOW (ref 12.0–15.0)
MCH: 33.2 pg (ref 26.0–34.0)
MCHC: 32.8 g/dL (ref 30.0–36.0)
MCV: 101.2 fL — ABNORMAL HIGH (ref 80.0–100.0)
Platelets: 29 10*3/uL — CL (ref 150–400)
RBC: 2.56 MIL/uL — ABNORMAL LOW (ref 3.87–5.11)
RDW: 19.8 % — ABNORMAL HIGH (ref 11.5–15.5)
WBC: 0.9 10*3/uL — CL (ref 4.0–10.5)
nRBC: 0 % (ref 0.0–0.2)

## 2019-01-28 LAB — MAGNESIUM: Magnesium: 2.2 mg/dL (ref 1.7–2.4)

## 2019-01-28 MED ORDER — METOPROLOL SUCCINATE ER 25 MG PO TB24
25.0000 mg | ORAL_TABLET | Freq: Every day | ORAL | Status: DC
  Administered 2019-01-28: 25 mg via ORAL
  Filled 2019-01-28: qty 1

## 2019-01-28 MED ORDER — NICOTINE 14 MG/24HR TD PT24
14.0000 mg | MEDICATED_PATCH | Freq: Every day | TRANSDERMAL | Status: DC
  Filled 2019-01-28: qty 1

## 2019-01-28 MED ORDER — ALBUTEROL SULFATE HFA 108 (90 BASE) MCG/ACT IN AERS: 1.0000 | INHALATION_SPRAY | Freq: Four times a day (QID) | RESPIRATORY_TRACT | Status: DC | PRN

## 2019-01-28 MED ORDER — PANTOPRAZOLE SODIUM 40 MG PO TBEC
40.0000 mg | DELAYED_RELEASE_TABLET | Freq: Every day | ORAL | Status: DC
  Administered 2019-01-28: 40 mg via ORAL
  Filled 2019-01-28: qty 1

## 2019-01-28 MED ORDER — OLANZAPINE 5 MG PO TABS
5.0000 mg | ORAL_TABLET | Freq: Two times a day (BID) | ORAL | Status: DC
  Administered 2019-01-28 (×2): 5 mg via ORAL
  Filled 2019-01-28 (×2): qty 1

## 2019-01-28 MED ORDER — AMPHETAMINE-DEXTROAMPHETAMINE 20 MG PO TABS: 10.0000 mg | ORAL_TABLET | Freq: Every evening | ORAL | Status: DC

## 2019-01-28 MED ORDER — SODIUM CHLORIDE 0.9% FLUSH: 3.0000 mL | INTRAVENOUS | Status: DC | PRN

## 2019-01-28 MED ORDER — OXYCODONE HCL ER 20 MG PO T12A
20.0000 mg | EXTENDED_RELEASE_TABLET | Freq: Three times a day (TID) | ORAL | Status: DC
  Administered 2019-01-28 (×2): 20 mg via ORAL
  Filled 2019-01-28 (×2): qty 1

## 2019-01-28 MED ORDER — ALBUTEROL SULFATE (2.5 MG/3ML) 0.083% IN NEBU
2.5000 mg | INHALATION_SOLUTION | Freq: Four times a day (QID) | RESPIRATORY_TRACT | Status: DC | PRN
  Administered 2019-01-28: 2.5 mg via RESPIRATORY_TRACT
  Filled 2019-01-28: qty 3

## 2019-01-28 MED ORDER — SODIUM CHLORIDE 0.9% FLUSH
3.0000 mL | Freq: Two times a day (BID) | INTRAVENOUS | Status: DC
  Administered 2019-01-28 (×2): 3 mL via INTRAVENOUS

## 2019-01-28 MED ORDER — ONDANSETRON HCL 8 MG PO TABS: 8.0000 mg | ORAL_TABLET | Freq: Three times a day (TID) | ORAL | Status: DC | PRN

## 2019-01-28 MED ORDER — LEVETIRACETAM 500 MG PO TABS
500.0000 mg | ORAL_TABLET | Freq: Two times a day (BID) | ORAL | Status: DC
  Administered 2019-01-28: 500 mg via ORAL
  Filled 2019-01-28: qty 1

## 2019-01-28 MED ORDER — OXYCODONE HCL 5 MG PO TABS
20.0000 mg | ORAL_TABLET | Freq: Four times a day (QID) | ORAL | Status: DC | PRN
  Administered 2019-01-28 (×2): 20 mg via ORAL
  Filled 2019-01-28 (×2): qty 4

## 2019-01-28 MED ORDER — POTASSIUM CHLORIDE CRYS ER 10 MEQ PO TBCR
10.0000 meq | EXTENDED_RELEASE_TABLET | Freq: Every day | ORAL | Status: DC
  Administered 2019-01-28: 10 meq via ORAL
  Filled 2019-01-28: qty 1

## 2019-01-28 MED ORDER — AMPHETAMINE-DEXTROAMPHET ER 10 MG PO CP24
30.0000 mg | ORAL_CAPSULE | Freq: Every day | ORAL | Status: DC
  Administered 2019-01-28: 30 mg via ORAL
  Filled 2019-01-28: qty 3

## 2019-01-28 MED ORDER — MAGNESIUM OXIDE 400 (241.3 MG) MG PO TABS
200.0000 mg | ORAL_TABLET | Freq: Every day | ORAL | Status: DC
  Administered 2019-01-28: 200 mg via ORAL
  Filled 2019-01-28: qty 1

## 2019-01-28 MED ORDER — OXYCODONE HCL ER 40 MG PO T12A: 40.0000 mg | EXTENDED_RELEASE_TABLET | Freq: Once | ORAL | Status: DC

## 2019-01-28 MED ORDER — SODIUM CHLORIDE 0.9 % IV SOLN
1.0000 g | Freq: Every day | INTRAVENOUS | Status: DC
  Administered 2019-01-28: 1 g via INTRAVENOUS
  Filled 2019-01-28: qty 1
  Filled 2019-01-28: qty 10

## 2019-01-28 MED ORDER — MAGNESIUM SULFATE 2 GM/50ML IV SOLN
2.0000 g | Freq: Once | INTRAVENOUS | Status: AC
  Administered 2019-01-28: 2 g via INTRAVENOUS
  Filled 2019-01-28: qty 50

## 2019-01-28 MED ORDER — LEVETIRACETAM 500 MG PO TABS: 500.0000 mg | ORAL_TABLET | Freq: Two times a day (BID) | ORAL | 0 refills | Status: AC

## 2019-01-28 MED ORDER — SODIUM CHLORIDE 0.9 % IV SOLN: 250.0000 mL | INTRAVENOUS | Status: DC | PRN

## 2019-01-28 MED ORDER — AZITHROMYCIN 250 MG PO TABS
500.0000 mg | ORAL_TABLET | Freq: Every day | ORAL | Status: DC
  Administered 2019-01-28: 500 mg via ORAL
  Filled 2019-01-28: qty 2

## 2019-01-28 NOTE — ED Notes (Signed)
ED TO INPATIENT HANDOFF REPORT  Name/Age/Gender Ashley Mayo 53 y.o. female  Code Status Code Status History    Date Active Date Inactive Code Status Order ID Comments User Context   01/25/2019 1530 01/26/2019 1705 Full Code [id number]  Georgette Shell, MD ED   01/23/2016 1853 01/27/2016 1616 Full Code [id number]  Mariel Aloe, MD ED   Advance Care Planning Activity      Home/SNF/Other Home  Chief Complaint chemo pt; spasms on right side  Level of Care/Admitting Diagnosis ED Disposition    ED Disposition Condition [facility] Area: [facility] [[id number]]  Level of Care: Med-Surg [16]  Covid Evaluation: Asymptomatic Screening Protocol (No Symptoms)  Diagnosis: Focal seizure [facility]) [[id number]]  Admitting Physician: Eston Esters  Attending Physician: Gwynne Edinger [[id number]]  Estimated length of stay: past midnight tomorrow  Certification:: I certify this patient will need inpatient services for at least 2 midnights  PT Class (Do Not Modify): Inpatient [101]  PT Acc Code (Do Not Modify): Private [1]       Medical History Past Medical History:  Diagnosis Date  . Anxiety   . Asthma   . Cancer ([place])    lung cancer  . COPD (chronic obstructive pulmonary disease) ([place])   . Depression   . GERD (gastroesophageal reflux disease)   . Hypertension   . Migraine     Allergies Allergies  Allergen Reactions  . Codeine Hives  . Shellfish Allergy Hives    IV Location/Drains/Wounds Patient Lines/Drains/Airways Status   Active Line/Drains/Airways    Name:   Placement date:   Placement time:   Site:   Days:   Peripheral IV 01/27/19 Left Antecubital   01/27/19    2113    Antecubital   1   Peripheral IV 01/28/19 Right Antecubital   01/28/19    0003    Antecubital   less than 1          Labs/Imaging Results for orders placed or performed during the hospital encounter of 01/27/19 (from the past 48 hour(s))  Comprehensive  metabolic panel     Status: Abnormal   Collection Time: 01/27/19  7:56 PM  Result Value Ref Range   Sodium 135 135 - 145 mmol/L   Potassium 3.9 3.5 - 5.1 mmol/L   Chloride 97 (L) 98 - 111 mmol/L   CO2 26 22 - 32 mmol/L   Glucose, Bld 97 70 - 99 mg/dL   BUN 19 6 - 20 mg/dL   Creatinine, Ser 0.81 0.44 - 1.00 mg/dL   Calcium 8.5 (L) 8.9 - 10.3 mg/dL   Total Protein 7.4 6.5 - 8.1 g/dL   Albumin 3.9 3.5 - 5.0 g/dL   AST 51 (H) 15 - 41 U/L   ALT 98 (H) 0 - 44 U/L   Alkaline Phosphatase 175 (H) 38 - 126 U/L   Total Bilirubin 0.6 0.3 - 1.2 mg/dL   GFR calc non Af Amer >60 >60 mL/min   GFR calc Af Amer >60 >60 mL/min   Anion gap 12 5 - 15    Comment: Performed at [facility], [place] [address], [place], [place] [address]  CBC with Differential     Status: Abnormal   Collection Time: 01/27/19  7:56 PM  Result Value Ref Range   WBC 1.2 (LL) 4.0 - 10.5 K/uL    Comment: This critical result has verified and been called to [facility]. RN by Sarita Bottom  on 12 04 2020 at 2041, and has been read back. CRITICAL RESULT VERIFIED   RBC 3.02 (L) 3.87 - 5.11 MIL/uL   Hemoglobin 9.9 (L) 12.0 - 15.0 g/dL   HCT 29.9 (L) 36.0 - 46.0 %   MCV 99.0 80.0 - 100.0 fL   MCH 32.8 26.0 - 34.0 pg   MCHC 33.1 30.0 - 36.0 g/dL   RDW 20.0 (H) 11.5 - 15.5 %   Platelets 36 (L) 150 - 400 K/uL    Comment: REPEATED TO VERIFY SPECIMEN CHECKED FOR CLOTS Immature Platelet Fraction may be clinically indicated, consider ordering this additional test [id number] CONSISTENT WITH PREVIOUS RESULT    nRBC 0.0 0.0 - 0.2 %   Neutrophils Relative % 44 %   Neutro Abs 0.5 (L) 1.7 - 7.7 K/uL   Lymphocytes Relative 38 %   Lymphs Abs 0.5 (L) 0.7 - 4.0 K/uL   Monocytes Relative 16 %   Monocytes Absolute 0.2 0.1 - 1.0 K/uL   Eosinophils Relative 1 %   Eosinophils Absolute 0.0 0.0 - 0.5 K/uL   Basophils Relative 0 %   Basophils Absolute 0.0 0.0 - 0.1 K/uL   Immature Granulocytes 1 %   Abs Immature Granulocytes  0.01 0.00 - 0.07 K/uL    Comment: Performed at [facility], [place] [address] [place], [place] [address]  Magnesium     Status: Abnormal   Collection Time: 01/27/19  7:56 PM  Result Value Ref Range   Magnesium 1.0 (L) 1.7 - 2.4 mg/dL    Comment: Performed at [facility], [place] [address], [place], [place] [address]  Phosphorus     Status: None   Collection Time: 01/27/19  7:56 PM  Result Value Ref Range   Phosphorus 3.9 2.5 - 4.6 mg/dL    Comment: Performed at [facility], [place] [address], [place], [place] [address]   Mr Brain Wo Contrast (neuro Protocol)  Result Date: 01/27/2019 CLINICAL DATA:  Initial evaluation for acute seizure. History of metastatic lung cancer. EXAM: MRI HEAD WITHOUT CONTRAST TECHNIQUE: Multiplanar, multiecho pulse sequences of the brain and surrounding structures were obtained without intravenous contrast. COMPARISON:  None available. FINDINGS: Brain: Examination limited by motion artifact and lack of IV contrast. Generalized age-related cerebral volume loss. Patchy T2/FLAIR hyperintensity within the periventricular and deep white matter both cerebral hemispheres noted, nonspecific, but most commonly related to chronic small vessel ischemic disease, mild in nature. No evidence for acute or subacute infarct. Gray-white matter differentiation maintained. No evidence for acute or chronic intracranial hemorrhage. 2.6 cm area of increased T2/FLAIR signal abnormality involving the parasagittal posterior left frontal region likely related to underlying metastatic lesion (series 6, image 20). Exact measurements of the underlying lesion limited due to lack of IV contrast. No significant regional mass effect. Postoperative changes from prior right occipital craniotomy noted. Underlying FLAIR signal abnormality could reflect postoperative gliosis and/or encephalomalacia. Residual tumor within this region not excluded. No other  definite visible intracranial mass lesions identified on this noncontrast examination. No midline shift or significant mass effect. No hydrocephalus. No extra-axial fluid collection. Vascular: Major intracranial vascular flow voids grossly maintained at the skull base. Skull and upper cervical spine: Craniocervical junction within normal limits. Upper cervical spine normal. Bone marrow signal intensity within normal limits. Prior right occipital craniotomy. 11 mm lesion involving the left occipital calvarium noted (series 7, image 75), indeterminate. No other focal marrow replacing lesions. Sinuses/Orbits: Globes and orbital soft tissues within normal limits. Paranasal  sinuses are clear. No mastoid effusion. Other: None. IMPRESSION: 1. Limited study due to motion artifact and lack of IV contrast. 2. 2.6 cm area of increased T2/FLAIR signal abnormality involving the parasagittal posterior left frontal region, likely vasogenic edema related to underlying metastatic disease. No significant regional mass effect. 3. Postoperative changes from prior right occipital craniotomy. Underlying FLAIR signal abnormality could reflect postoperative gliosis and/or encephalomalacia. Residual tumor not excluded. No other visible intracranial metastatic lesions identified on this noncontrast examination. 4. 11 mm lesion involving the left occipital calvarium, indeterminate. Attention at follow-up recommended. 5. Underlying atrophy with chronic small vessel ischemic disease. No other acute intracranial abnormality. Electronically Signed   By: Jeannine Boga M.D.   On: 01/27/2019 23:33   Dg Chest Port 1 View  Result Date: 01/27/2019 CLINICAL DATA:  Stage IV lung and brain cancer, RIGHT arm and RIGHT leg trembling, spasms, generalized pain, epistaxis yesterday for 6 hours, on blood thinners, history asthma, COPD, GERD, hypertension EXAM: PORTABLE CHEST 1 VIEW COMPARISON:  Portable exam 1820 hours compared to 01/25/2019  FINDINGS: Normal heart size, mediastinal contours, and pulmonary vascularity. Emphysematous and bronchitic changes consistent with COPD. Minimal atelectasis in RIGHT upper lobe and LEFT base. RIGHT basilar infiltrate question pneumonia. Remaining lungs clear. No pleural effusion or pneumothorax. Bones demineralized. IMPRESSION: Changes of COPD with scattered atelectasis and mild RIGHT basilar infiltrate question pneumonia. Electronically Signed   By: Lavonia Dana M.D.   On: 01/27/2019 19:07    Pending Labs Unresulted Labs (From admission, onward)    Start     Ordered   01/27/19 2342  SARS CORONAVIRUS 2 (TAT 6-24 HRS) Nasopharyngeal Nasopharyngeal Swab  (Asymptomatic/Tier 3)  Once,   STAT    Question Answer Comment  Is this test for diagnosis or screening Screening   Symptomatic for COVID-19 as defined by CDC No   Hospitalized for COVID-19 No   Admitted to ICU for COVID-19 No   Previously tested for COVID-19 Yes   Resident in a congregate (group) care setting No   Employed in healthcare setting No   Pregnant No      01/27/19 2341   Signed and Held  CBC  Tomorrow morning,   R     Signed and Held   Signed and Held  Comprehensive metabolic panel  Tomorrow morning,   R     Signed and Held   Signed and Held  Magnesium  Once,   R     Signed and Held          Vitals/Pain Today's Vitals   01/27/19 1743 01/27/19 1757 01/27/19 2000 01/28/19 0000  BP: (!) 163/119  (!) 131/118 119/89  Pulse: (!) 128  (!) 104 98  Resp: 18  19 18   Temp: 98.2 F (36.8 C)     TempSrc: Oral     SpO2: 98%  98% 100%  Weight:  59.9 kg    Height:  5\' 4"  (1.626 m)    PainSc:  7       Isolation Precautions No active isolations  Medications Medications  oxyCODONE (OXYCONTIN) 12 hr tablet 40 mg (40 mg Oral Given 01/27/19 2005)  oxyCODONE (Oxy IR/ROXICODONE) immediate release tablet 20 mg (20 mg Oral Given 01/28/19 0028)  diazepam (VALIUM) tablet 2 mg (2 mg Oral Given 01/27/19 2006)  magnesium sulfate IVPB 1 g  100 mL (1 g Intravenous New Bag/Given 01/28/19 0002)  levETIRAcetam (KEPPRA) IVPB 1000 mg/100 mL premix (0 mg Intravenous Stopped 01/27/19 2155)    Mobility  walks

## 2019-01-28 NOTE — Discharge Summary (Signed)
Physician Discharge Summary  Ashley Mayo XFG:[id number] DOB: 1965-11-30 DOA: 01/27/2019  PCP: System, Pcp Not In  Admit date: 01/27/2019 Discharge date: 01/28/2019  Admitted From: Home Discharge disposition: Home   Code Status: Full Code  Diet Recommendation: Regular diet   Recommendations for Outpatient Follow-Up:   1. Follow-up with PCP as outpatient 2. Follow-up with neurology as an outpatient.  Referral given.  Discharge Diagnosis:   Principal Problem:   Focal seizure ([place]) Active Problems:   Pneumonia   Depression   Metastatic lung cancer (metastasis from lung to other site) [name])   Essential hypertension   Thrombocytopenia (HCC)   Leukopenia   ADHD   History of pulmonary embolism    History of Present Illness / Brief narrative:  Ashley Mayo is a 53 y.o. female with past medical history significant for metastatic lung cancer with brain mets followed at [facility], COPD, hypertension, migraine presents to the emergency department for recurrent spasms in tremoring of her right arm and right leg that started on 12/3. In the ER, right arm and right leg jerking for witnessed by ED physician lasting for about a minute and a half.  He was loaded with Keppra. Admitted under hospitalist service.  Neurology consultation appreciated.  Hospital Course:  Focal seizure -Patient has known metastatic lung cancer with brain mets performed on 01/02/2019 showing improvement in multiple intracranial enhancing lesions stable left frontal lobe lesion.  -There is increase in size of left parietal occipital calvarial lesion concerning for progression of osseous metastatic disease. -Patient denies any history of seizure and was not on any antiepileptic at home. -MRI brain was obtained on this admission with findings as below. -Keppra 500 mg twice daily started by neurology.  Patient feels better and is ready to go home today.  Hypomagnesemia  -Secondary to chemo induced diarrhea.   Improved with replacement.  In the last discharge, I gave her a prescription for magnesium oral supplement.  Pancytopenia -Secondary to chemotherapy.  Stable.  Chronic pain - cont home opioids  HTN - here bp wnl. cont home metoprolol  ADHD- cont home adderall  Mood disorder - cont home olanzapine  Stable for discharge home.Marland Kitchen  Spoke with patient's daughter at bedside   MRI brain:  1. Limited study due to motion artifact and lack of IV contrast. 2. 2.6 cm area of increased T2/FLAIR signal abnormality involving the parasagittal posterior left frontal region, likely vasogenic edema related to underlying metastatic disease. No significant regional mass effect. 3. Postoperative changes from prior right occipital craniotomy. Underlying FLAIR signal abnormality could reflect postoperative gliosis and/or encephalomalacia. Residual tumor not excluded. No other visible intracranial metastatic lesions identified on this noncontrast examination. 4. 11 mm lesion involving the left occipital calvarium, indeterminate. Attention at follow-up recommended. 5. Underlying atrophy with chronic small vessel ischemic disease. No other acute intracranial abnormality.   Subjective:  Seen and examined this morning.  Middle-aged Caucasian female.  Seizures controlled after Keppra was started.  Feels ready to go home today.  Discharge Exam:   Vitals:   01/28/19 0000 01/28/19 0137 01/28/19 0622 01/28/19 1223  BP: 119/89 120/81 125/85 100/62  Pulse: 98 91 95 (!) 101  Resp: 18 16 16 16   Temp:  98.5 F (36.9 C) 97.9 F (36.6 C) 99.1 F (37.3 C)  TempSrc:  Oral Oral Oral  SpO2: 100% 94% 99% 96%  Weight:      Height:        Body mass index is 22.66 kg/m.  General exam: Appears  calm and comfortable.  Skin: No rashes, lesions or ulcers. HEENT: Atraumatic, normocephalic, supple neck, no obvious bleeding Lungs: Clear to auscultate bilaterally CVS: Regular rate and rhythm, no murmur GI/Abd  soft, nontender, nondistended, bowel sound present CNS: Alert, awake monitor x3 Psychiatry: Mood appropriate Extremities: No pedal edema, no calf tenderness  Discharge Instructions:  Wound care: None Discharge Instructions    Ambulatory referral to Neurology-Dr Sethi   Complete by: As directed    An appointment is requested in approximately: 4 weeks   Diet - low sodium heart healthy   Complete by: As directed    Diet general   Complete by: As directed    Increase activity slowly   Complete by: As directed    Increase activity slowly   Complete by: As directed      Follow-up Information    Garvin Fila, MD Follow up.   Specialties: Neurology, Radiology Contact information: [place] [address] [phone]          Allergies as of 01/28/2019      Reactions   Codeine Hives   Shellfish Allergy Hives      Medication List    TAKE these medications   Adderall XR 30 MG 24 hr capsule Generic drug: amphetamine-dextroamphetamine Take 30 mg by mouth daily.   amphetamine-dextroamphetamine 10 MG tablet Commonly known as: ADDERALL Take 10 mg by mouth every evening.   albuterol (2.5 MG/3ML) 0.083% nebulizer solution Commonly known as: PROVENTIL Take 2.5 mg by nebulization every 6 (six) hours as needed for wheezing or shortness of breath.   albuterol 108 (90 Base) MCG/ACT inhaler Commonly known as: VENTOLIN HFA Inhale 1-2 puffs into the lungs every 6 (six) hours as needed for wheezing or shortness of breath.   diphenhydrAMINE 25 MG tablet Commonly known as: BENADRYL Take 25 mg by mouth at bedtime as needed for sleep.   esomeprazole 40 MG capsule Commonly known as: NEXIUM Take 40 mg by mouth daily.   furosemide 20 MG tablet Commonly known as: LASIX Take 20 mg by mouth daily as needed for fluid.   levETIRAcetam 500 MG tablet Commonly known as: KEPPRA Take 1 tablet (500 mg total) by mouth 2 (two) times daily.   lidocaine 2 %  solution Commonly known as: XYLOCAINE Use as directed 15 mLs in the mouth or throat every 6 (six) hours as needed for mouth pain.   lidocaine 5 % Commonly known as: LIDODERM Place 1-2 patches onto the skin daily as needed (for back pain).   Magnesium 200 MG Tabs Take 1 tablet (200 mg total) by mouth daily.   metoprolol succinate 25 MG 24 hr tablet Commonly known as: TOPROL-XL Take 25 mg by mouth daily.   nystatin 100000 UNIT/ML suspension Commonly known as: MYCOSTATIN Take 5 mLs (500,000 Units total) by mouth 4 (four) times daily.   OLANZapine 5 MG tablet Commonly known as: ZYPREXA Take 5 mg by mouth 2 (two) times daily.   ondansetron 8 MG tablet Commonly known as: ZOFRAN Take 8 mg by mouth every 8 (eight) hours as needed for nausea or vomiting.   OxyCONTIN 20 mg 12 hr tablet Generic drug: oxyCODONE Take 20 mg by mouth every 8 (eight) hours.   potassium chloride 10 MEQ CR capsule Commonly known as: MICRO-K Take 10 mEq by mouth 2 (two) times daily.   SUMAtriptan 6 MG/0.5ML Soln injection Commonly known as: IMITREX Inject 6 mg into the skin every 2 (two) hours as needed for migraine.  valACYclovir 500 MG tablet Commonly known as: VALTREX Take 500 mg by mouth 2 (two) times daily as needed (cold sores).       Time coordinating discharge: 25 minutes  The results of significant diagnostics from this hospitalization (including imaging, microbiology, ancillary and laboratory) are listed below for reference.    Procedures and Diagnostic Studies:   Mr Brain Wo Contrast (neuro Protocol)  Result Date: 01/27/2019 CLINICAL DATA:  Initial evaluation for acute seizure. History of metastatic lung cancer. EXAM: MRI HEAD WITHOUT CONTRAST TECHNIQUE: Multiplanar, multiecho pulse sequences of the brain and surrounding structures were obtained without intravenous contrast. COMPARISON:  None available. FINDINGS: Brain: Examination limited by motion artifact and lack of IV contrast.  Generalized age-related cerebral volume loss. Patchy T2/FLAIR hyperintensity within the periventricular and deep white matter both cerebral hemispheres noted, nonspecific, but most commonly related to chronic small vessel ischemic disease, mild in nature. No evidence for acute or subacute infarct. Gray-white matter differentiation maintained. No evidence for acute or chronic intracranial hemorrhage. 2.6 cm area of increased T2/FLAIR signal abnormality involving the parasagittal posterior left frontal region likely related to underlying metastatic lesion (series 6, image 20). Exact measurements of the underlying lesion limited due to lack of IV contrast. No significant regional mass effect. Postoperative changes from prior right occipital craniotomy noted. Underlying FLAIR signal abnormality could reflect postoperative gliosis and/or encephalomalacia. Residual tumor within this region not excluded. No other definite visible intracranial mass lesions identified on this noncontrast examination. No midline shift or significant mass effect. No hydrocephalus. No extra-axial fluid collection. Vascular: Major intracranial vascular flow voids grossly maintained at the skull base. Skull and upper cervical spine: Craniocervical junction within normal limits. Upper cervical spine normal. Bone marrow signal intensity within normal limits. Prior right occipital craniotomy. 11 mm lesion involving the left occipital calvarium noted (series 7, image 75), indeterminate. No other focal marrow replacing lesions. Sinuses/Orbits: Globes and orbital soft tissues within normal limits. Paranasal sinuses are clear. No mastoid effusion. Other: None. IMPRESSION: 1. Limited study due to motion artifact and lack of IV contrast. 2. 2.6 cm area of increased T2/FLAIR signal abnormality involving the parasagittal posterior left frontal region, likely vasogenic edema related to underlying metastatic disease. No significant regional mass effect. 3.  Postoperative changes from prior right occipital craniotomy. Underlying FLAIR signal abnormality could reflect postoperative gliosis and/or encephalomalacia. Residual tumor not excluded. No other visible intracranial metastatic lesions identified on this noncontrast examination. 4. 11 mm lesion involving the left occipital calvarium, indeterminate. Attention at follow-up recommended. 5. Underlying atrophy with chronic small vessel ischemic disease. No other acute intracranial abnormality. Electronically Signed   By: Jeannine Boga M.D.   On: 01/27/2019 23:33   Dg Chest Port 1 View  Result Date: 01/27/2019 CLINICAL DATA:  Stage IV lung and brain cancer, RIGHT arm and RIGHT leg trembling, spasms, generalized pain, epistaxis yesterday for 6 hours, on blood thinners, history asthma, COPD, GERD, hypertension EXAM: PORTABLE CHEST 1 VIEW COMPARISON:  Portable exam 1820 hours compared to 01/25/2019 FINDINGS: Normal heart size, mediastinal contours, and pulmonary vascularity. Emphysematous and bronchitic changes consistent with COPD. Minimal atelectasis in RIGHT upper lobe and LEFT base. RIGHT basilar infiltrate question pneumonia. Remaining lungs clear. No pleural effusion or pneumothorax. Bones demineralized. IMPRESSION: Changes of COPD with scattered atelectasis and mild RIGHT basilar infiltrate question pneumonia. Electronically Signed   By: Lavonia Dana M.D.   On: 01/27/2019 19:07     Labs:   Basic Metabolic Panel: Recent Labs  Lab 01/25/19 1244  01/25/19 1631 01/26/19 0725 01/27/19 1956 01/28/19 0504  NA 136 139 138 135 135  K 3.6 3.0* 3.6 3.9 3.9  CL 101 103 102 97* 97*  CO2 23 27 26 26 26   GLUCOSE 89 81 95 97 111*  BUN 19 16 14 19 15   CREATININE 0.66 0.73 0.60 0.81 0.67  CALCIUM 6.2* 6.9* 7.1* 8.5* 8.1*  MG 0.4* 2.1  --  1.0* 2.2  PHOS  --   --   --  3.9  --    GFR Estimated Creatinine Clearance: 69.4 mL/min (by C-G formula based on SCr of 0.67 mg/dL). Liver Function Tests: Recent  Labs  Lab 01/25/19 1244 01/25/19 1631 01/26/19 0725 01/27/19 1956 01/28/19 0504  AST 87* 89* 70* 51* 38  ALT 129* 133* 110* 98* 73*  ALKPHOS 128* 123 129* 175* 146*  BILITOT 0.4 0.5 0.6 0.6 0.4  PROT 6.1* 6.4* 6.0* 7.4 5.9*  ALBUMIN 3.2* 3.5 3.2* 3.9 3.0*   No results for input(s): LIPASE, AMYLASE in the last 168 hours. No results for input(s): AMMONIA in the last 168 hours. Coagulation profile No results for input(s): INR, PROTIME in the last 168 hours.  CBC: Recent Labs  Lab 01/25/19 1244 01/26/19 0725 01/27/19 1956 01/28/19 0504  WBC 1.8* 1.2* 1.2* 0.9*  NEUTROABS 0.8*  --  0.5*  --   HGB 6.2* 9.0* 9.9* 8.5*  HCT 18.9* 26.7* 29.9* 25.9*  MCV 106.8* 99.6 99.0 101.2*  PLT 6* 49* 36* 29*   Cardiac Enzymes: No results for input(s): CKTOTAL, CKMB, CKMBINDEX, TROPONINI in the last 168 hours. BNP: Invalid input(s): POCBNP CBG: No results for input(s): GLUCAP in the last 168 hours. D-Dimer No results for input(s): DDIMER in the last 72 hours. Hgb A1c No results for input(s): HGBA1C in the last 72 hours. Lipid Profile No results for input(s): CHOL, HDL, LDLCALC, TRIG, CHOLHDL, LDLDIRECT in the last 72 hours. Thyroid function studies No results for input(s): TSH, T4TOTAL, T3FREE, THYROIDAB in the last 72 hours.  Invalid input(s): FREET3 Anemia work up No results for input(s): VITAMINB12, FOLATE, FERRITIN, TIBC, IRON, RETICCTPCT in the last 72 hours. Microbiology Recent Results (from the past 240 hour(s))  SARS CORONAVIRUS 2 (TAT 6-24 HRS) Nasopharyngeal Nasopharyngeal Swab     Status: None   Collection Time: 01/25/19  2:46 PM   Specimen: Nasopharyngeal Swab  Result Value Ref Range Status   SARS Coronavirus 2 NEGATIVE NEGATIVE Final    Comment: (NOTE) SARS-CoV-2 target nucleic acids are NOT DETECTED. The SARS-CoV-2 RNA is generally detectable in upper and lower respiratory specimens during the acute phase of infection. Negative results do not preclude SARS-CoV-2  infection, do not rule out co-infections with other pathogens, and should not be used as the sole basis for treatment or other patient management decisions. Negative results must be combined with clinical observations, patient history, and epidemiological information. The expected result is Negative. Fact Sheet for Patients: SugarRoll.be Fact Sheet for Healthcare Providers: https://www.woods-mathews.com/ This test is not yet approved or cleared by the [place] FDA and  has been authorized for detection and/or diagnosis of SARS-CoV-2 by FDA under an Emergency Use Authorization (EUA). This EUA will remain  in effect (meaning this test can be used) for the duration of the COVID-19 declaration under Section 56 4(b)(1) of the Act, 21 U.S.C. section 360bbb-3(b)(1), unless the authorization is terminated or revoked sooner. Performed at [facility], [place] [address] [place], [place] [address]   SARS CORONAVIRUS 2 (TAT 6-24 HRS) Nasopharyngeal  Nasopharyngeal Swab     Status: None   Collection Time: 01/27/19 11:57 PM   Specimen: Nasopharyngeal Swab  Result Value Ref Range Status   SARS Coronavirus 2 NEGATIVE NEGATIVE Final    Comment: (NOTE) SARS-CoV-2 target nucleic acids are NOT DETECTED. The SARS-CoV-2 RNA is generally detectable in upper and lower respiratory specimens during the acute phase of infection. Negative results do not preclude SARS-CoV-2 infection, do not rule out co-infections with other pathogens, and should not be used as the sole basis for treatment or other patient management decisions. Negative results must be combined with clinical observations, patient history, and epidemiological information. The expected result is Negative. Fact Sheet for Patients: SugarRoll.be Fact Sheet for Healthcare Providers: https://www.woods-mathews.com/ This test is not yet approved or cleared by  the [place] FDA and  has been authorized for detection and/or diagnosis of SARS-CoV-2 by FDA under an Emergency Use Authorization (EUA). This EUA will remain  in effect (meaning this test can be used) for the duration of the COVID-19 declaration under Section 56 4(b)(1) of the Act, 21 U.S.C. section 360bbb-3(b)(1), unless the authorization is terminated or revoked sooner. Performed at [facility], [place] [address], [place], [place] [address]     Please note: You were cared for by a hospitalist during your hospital stay. Once you are discharged, your primary care physician will handle any further medical issues. Please note that NO REFILLS for any discharge medications will be authorized once you are discharged, as it is imperative that you return to your primary care physician (or establish a relationship with a primary care physician if you do not have one) for your post hospital discharge needs so that they can reassess your need for medications and monitor your lab values.  Signed: Terrilee Croak  [facility] 01/28/2019, 1:52 PM

## 2019-01-28 NOTE — Consult Note (Signed)
Requesting Physician: Dr. Francia Greaves    Chief Complaint:   History obtained from: Patient and Chart   HPI:                                                                                                                                       Ashley Mayo is a 53 y.o. female with past medical history significant for metastatic lung cancer with brain mets followed at [facility], COPD, hypertension, migraine presents to the emergency department for recurrent spasms in tremoring of her right arm and right leg that started on 12/3.  She was evaluated in the ED at [facility] and right arm and right leg jerking witnessed by ED physician lasting for about a minute and a half.  Discussed with neurology(myself) and patient was loaded with Keppra and has stopped since.     Patient has known metastatic lung cancer with brain mets performed on 01/02/2019 showing provement in multiple intracranial enhancing lesions stable left frontal lobe lesion.  There is increase in size of left parietal occipital calvarial lesion concerning for progression of osseous metastatic disease.  Denies having history of seizures.      Past Medical History:  Diagnosis Date  . Anxiety   . Asthma   . Cancer ([place])    lung cancer  . COPD (chronic obstructive pulmonary disease) ([place])   . Depression   . GERD (gastroesophageal reflux disease)   . Hypertension   . Migraine     Past Surgical History:  Procedure Laterality Date  . ABDOMINAL HYSTERECTOMY    . CESAREAN SECTION    . ORIF right hip fracture      Family History  Problem Relation Age of Onset  . Breast cancer Mother    Social History:  reports that she has quit smoking. Her smoking use included cigarettes. She has a 8.75 pack-year smoking history. She has never used smokeless tobacco. She reports that she does not drink alcohol or use drugs.  Allergies:  Allergies  Allergen Reactions  . Codeine Hives  . Shellfish Allergy Hives    Medications:                                                                                                                         I reviewed home medications   ROS:  14 systems reviewed and negative except above    Examination:                                                                                                      General: Appears well-developed and well-nourished.  Psych: Affect appropriate to situation Eyes: No scleral injection HENT: No OP obstrucion Head: Normocephalic.  Cardiovascular: Normal rate and regular rhythm.  Respiratory: Effort normal and breath sounds normal to anterior ascultation GI: Soft.  No distension. There is no tenderness.  Skin: WDI    Neurological Examination Mental Status: Drowsy but was easily arousable, oriented but not cooperative if he is just woken from her sleep, thought content appropriate.  Speech fluent without evidence of aphasia. Able to follow 3 step commands without difficulty. Cranial Nerves: II: Visual fields grossly normal,  III,IV, VI: ptosis not present, extra-ocular motions intact bilaterally, pupils equal, round, reactive to light and accommodation V,VII: smile symmetric, facial light touch sensation normal bilaterally VIII: hearing normal bilaterally IX,X: uvula rises symmetrically XI: bilateral shoulder shrug XII: midline tongue extension Motor: Right : Upper extremity   5/5    Left:     Upper extremity   5/5  Lower extremity   4/5     Lower extremity   4+/5 Tone and bulk:normal tone throughout; no atrophy noted Sensory: Pinprick and light touch intact throughout, bilaterally Deep Tendon Reflexes: 2+ and symmetric throughout Plantars: Right: downgoing   Left: downgoing Cerebellar: normal finger-to-nose, normal rapid alternating movements and normal heel-to-shin test Gait: normal gait and station      Lab Results: Basic Metabolic Panel: Recent Labs  Lab 01/25/19 1244 01/25/19 1631 01/26/19 0725 01/27/19 1956 01/28/19 0504  NA 136 139 138 135 135  K 3.6 3.0* 3.6 3.9 3.9  CL 101 103 102 97* 97*  CO2 23 27 26 26 26   GLUCOSE 89 81 95 97 111*  BUN 19 16 14 19 15   CREATININE 0.66 0.73 0.60 0.81 0.67  CALCIUM 6.2* 6.9* 7.1* 8.5* 8.1*  MG 0.4* 2.1  --  1.0* 2.2  PHOS  --   --   --  3.9  --     CBC: Recent Labs  Lab 01/25/19 1244 01/26/19 0725 01/27/19 1956  WBC 1.8* 1.2* 1.2*  NEUTROABS 0.8*  --  0.5*  HGB 6.2* 9.0* 9.9*  HCT 18.9* 26.7* 29.9*  MCV 106.8* 99.6 99.0  PLT 6* 49* 36*    Coagulation Studies: No results for input(s): LABPROT, INR in the last 72 hours.  Imaging: Mr Brain Wo Contrast (neuro Protocol)  Result Date: 01/27/2019 CLINICAL DATA:  Initial evaluation for acute seizure. History of metastatic lung cancer. EXAM: MRI HEAD WITHOUT CONTRAST TECHNIQUE: Multiplanar, multiecho pulse sequences of the brain and surrounding structures were obtained without intravenous contrast. COMPARISON:  None available. FINDINGS: Brain: Examination limited by motion artifact and lack of IV contrast. Generalized age-related cerebral volume loss. Patchy T2/FLAIR hyperintensity within the periventricular and deep white matter both cerebral hemispheres noted, nonspecific, but most commonly related to chronic small vessel ischemic disease, mild in nature. No evidence  for acute or subacute infarct. Gray-white matter differentiation maintained. No evidence for acute or chronic intracranial hemorrhage. 2.6 cm area of increased T2/FLAIR signal abnormality involving the parasagittal posterior left frontal region likely related to underlying metastatic lesion (series 6, image 20). Exact measurements of the underlying lesion limited due to lack of IV contrast. No significant regional mass effect. Postoperative changes from prior right occipital craniotomy noted. Underlying FLAIR signal  abnormality could reflect postoperative gliosis and/or encephalomalacia. Residual tumor within this region not excluded. No other definite visible intracranial mass lesions identified on this noncontrast examination. No midline shift or significant mass effect. No hydrocephalus. No extra-axial fluid collection. Vascular: Major intracranial vascular flow voids grossly maintained at the skull base. Skull and upper cervical spine: Craniocervical junction within normal limits. Upper cervical spine normal. Bone marrow signal intensity within normal limits. Prior right occipital craniotomy. 11 mm lesion involving the left occipital calvarium noted (series 7, image 75), indeterminate. No other focal marrow replacing lesions. Sinuses/Orbits: Globes and orbital soft tissues within normal limits. Paranasal sinuses are clear. No mastoid effusion. Other: None. IMPRESSION: 1. Limited study due to motion artifact and lack of IV contrast. 2. 2.6 cm area of increased T2/FLAIR signal abnormality involving the parasagittal posterior left frontal region, likely vasogenic edema related to underlying metastatic disease. No significant regional mass effect. 3. Postoperative changes from prior right occipital craniotomy. Underlying FLAIR signal abnormality could reflect postoperative gliosis and/or encephalomalacia. Residual tumor not excluded. No other visible intracranial metastatic lesions identified on this noncontrast examination. 4. 11 mm lesion involving the left occipital calvarium, indeterminate. Attention at follow-up recommended. 5. Underlying atrophy with chronic small vessel ischemic disease. No other acute intracranial abnormality. Electronically Signed   By: Jeannine Boga M.D.   On: 01/27/2019 23:33   Dg Chest Port 1 View  Result Date: 01/27/2019 CLINICAL DATA:  Stage IV lung and brain cancer, RIGHT arm and RIGHT leg trembling, spasms, generalized pain, epistaxis yesterday for 6 hours, on blood thinners, history  asthma, COPD, GERD, hypertension EXAM: PORTABLE CHEST 1 VIEW COMPARISON:  Portable exam 1820 hours compared to 01/25/2019 FINDINGS: Normal heart size, mediastinal contours, and pulmonary vascularity. Emphysematous and bronchitic changes consistent with COPD. Minimal atelectasis in RIGHT upper lobe and LEFT base. RIGHT basilar infiltrate question pneumonia. Remaining lungs clear. No pleural effusion or pneumothorax. Bones demineralized. IMPRESSION: Changes of COPD with scattered atelectasis and mild RIGHT basilar infiltrate question pneumonia. Electronically Signed   By: Lavonia Dana M.D.   On: 01/27/2019 19:07     I have reviewed the above imaging : MRI brain  1. Limited study due to motion artifact and lack of IV contrast. 2. 2.6 cm area of increased T2/FLAIR signal abnormality involving the parasagittal posterior left frontal region, likely vasogenic edema related to underlying metastatic disease. No significant regional mass effect. 3. Postoperative changes from prior right occipital craniotomy. Underlying FLAIR signal abnormality could reflect postoperative gliosis and/or encephalomalacia. Residual tumor not excluded. No other visible intracranial metastatic lesions identified on this noncontrast examination. 4. 11 mm lesion involving the left occipital calvarium, indeterminate. Attention at follow-up recommended. 5. Underlying atrophy with chronic small vessel ischemic disease. No other acute intracranial abnormality.   ASSESSMENT AND PLAN  53 year old female with metastatic lung cancer with brain mets presenting with intermittent right arm spasms and tremor concerning for focal seizures secondary due to left frontal metastatic lesion. MRI brain shows some mild vasogenic edema.  However unable to compare prior MRI done at [facility] to see if this is  increased.  Patient was loaded with 1 g of Keppra and since then has not had no further seizures.  On my assessment no obvious tremors noted, mild  weakness in the right side compared to the left.   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

## 2019-01-28 NOTE — Progress Notes (Signed)
CRITICAL VALUE ALERT  Critical Value: WBC 0.9 (was 1.2), PLT 29(was 36)  Date & Time Notied:  12/5 0637  Provider Notified: floor cov  Orders Received/Actions taken:

## 2019-07-06 ENCOUNTER — Emergency Department (HOSPITAL_COMMUNITY): Payer: Medicaid Other

## 2019-07-06 ENCOUNTER — Encounter (HOSPITAL_COMMUNITY): Payer: Self-pay

## 2019-07-06 ENCOUNTER — Inpatient Hospital Stay (HOSPITAL_COMMUNITY)
Admission: EM | Admit: 2019-07-06 | Discharge: 2019-07-11 | DRG: 871 | Disposition: A | Discharge status: Home / Self Care | Payer: Medicaid Other | Attending: Internal Medicine | Admitting: Internal Medicine

## 2019-07-06 ENCOUNTER — Inpatient Hospital Stay (HOSPITAL_COMMUNITY): Payer: Medicaid Other

## 2019-07-06 ENCOUNTER — Inpatient Hospital Stay (HOSPITAL_COMMUNITY)
Admit: 2019-07-06 | Discharge: 2019-07-06 | Disposition: A | Discharge status: Home / Self Care | Payer: Medicaid Other | Attending: Orthopedic Surgery | Admitting: Orthopedic Surgery

## 2019-07-06 DIAGNOSIS — C7931 Secondary malignant neoplasm of brain: Secondary | ICD-10-CM | POA: Diagnosis present

## 2019-07-06 DIAGNOSIS — Z9911 Dependence on respirator [ventilator] status: Secondary | ICD-10-CM | POA: Diagnosis not present

## 2019-07-06 DIAGNOSIS — G40802 Other epilepsy, not intractable, without status epilepticus: Secondary | ICD-10-CM | POA: Diagnosis present

## 2019-07-06 DIAGNOSIS — Z8701 Personal history of pneumonia (recurrent): Secondary | ICD-10-CM

## 2019-07-06 DIAGNOSIS — E872 Acidosis: Secondary | ICD-10-CM | POA: Diagnosis not present

## 2019-07-06 DIAGNOSIS — Z79899 Other long term (current) drug therapy: Secondary | ICD-10-CM

## 2019-07-06 DIAGNOSIS — C3490 Malignant neoplasm of unspecified part of unspecified bronchus or lung: Secondary | ICD-10-CM | POA: Diagnosis not present

## 2019-07-06 DIAGNOSIS — C7951 Secondary malignant neoplasm of bone: Secondary | ICD-10-CM | POA: Diagnosis present

## 2019-07-06 DIAGNOSIS — F909 Attention-deficit hyperactivity disorder, unspecified type: Secondary | ICD-10-CM | POA: Diagnosis present

## 2019-07-06 DIAGNOSIS — Z20822 Contact with and (suspected) exposure to covid-19: Secondary | ICD-10-CM | POA: Diagnosis present

## 2019-07-06 DIAGNOSIS — K219 Gastro-esophageal reflux disease without esophagitis: Secondary | ICD-10-CM | POA: Diagnosis present

## 2019-07-06 DIAGNOSIS — Z803 Family history of malignant neoplasm of breast: Secondary | ICD-10-CM

## 2019-07-06 DIAGNOSIS — C3411 Malignant neoplasm of upper lobe, right bronchus or lung: Secondary | ICD-10-CM | POA: Diagnosis present

## 2019-07-06 DIAGNOSIS — J9621 Acute and chronic respiratory failure with hypoxia: Secondary | ICD-10-CM

## 2019-07-06 DIAGNOSIS — C3491 Malignant neoplasm of unspecified part of right bronchus or lung: Secondary | ICD-10-CM | POA: Diagnosis not present

## 2019-07-06 DIAGNOSIS — J189 Pneumonia, unspecified organism: Secondary | ICD-10-CM

## 2019-07-06 DIAGNOSIS — R54 Age-related physical debility: Secondary | ICD-10-CM | POA: Diagnosis present

## 2019-07-06 DIAGNOSIS — R4182 Altered mental status, unspecified: Secondary | ICD-10-CM | POA: Diagnosis not present

## 2019-07-06 DIAGNOSIS — R6520 Severe sepsis without septic shock: Secondary | ICD-10-CM

## 2019-07-06 DIAGNOSIS — D689 Coagulation defect, unspecified: Secondary | ICD-10-CM | POA: Diagnosis present

## 2019-07-06 DIAGNOSIS — J440 Chronic obstructive pulmonary disease with acute lower respiratory infection: Secondary | ICD-10-CM | POA: Diagnosis present

## 2019-07-06 DIAGNOSIS — A419 Sepsis, unspecified organism: Secondary | ICD-10-CM

## 2019-07-06 DIAGNOSIS — D539 Nutritional anemia, unspecified: Secondary | ICD-10-CM | POA: Diagnosis present

## 2019-07-06 DIAGNOSIS — F419 Anxiety disorder, unspecified: Secondary | ICD-10-CM | POA: Diagnosis not present

## 2019-07-06 DIAGNOSIS — J13 Pneumonia due to Streptococcus pneumoniae: Secondary | ICD-10-CM | POA: Diagnosis present

## 2019-07-06 DIAGNOSIS — Z79891 Long term (current) use of opiate analgesic: Secondary | ICD-10-CM

## 2019-07-06 DIAGNOSIS — Z885 Allergy status to narcotic agent status: Secondary | ICD-10-CM

## 2019-07-06 DIAGNOSIS — I952 Hypotension due to drugs: Secondary | ICD-10-CM | POA: Diagnosis not present

## 2019-07-06 DIAGNOSIS — G43909 Migraine, unspecified, not intractable, without status migrainosus: Secondary | ICD-10-CM | POA: Diagnosis present

## 2019-07-06 DIAGNOSIS — C787 Secondary malignant neoplasm of liver and intrahepatic bile duct: Secondary | ICD-10-CM | POA: Diagnosis present

## 2019-07-06 DIAGNOSIS — G893 Neoplasm related pain (acute) (chronic): Secondary | ICD-10-CM | POA: Diagnosis present

## 2019-07-06 DIAGNOSIS — L8990 Pressure ulcer of unspecified site, unspecified stage: Secondary | ICD-10-CM | POA: Insufficient documentation

## 2019-07-06 DIAGNOSIS — I10 Essential (primary) hypertension: Secondary | ICD-10-CM | POA: Diagnosis present

## 2019-07-06 DIAGNOSIS — G9341 Metabolic encephalopathy: Secondary | ICD-10-CM | POA: Diagnosis present

## 2019-07-06 DIAGNOSIS — Z86711 Personal history of pulmonary embolism: Secondary | ICD-10-CM | POA: Diagnosis present

## 2019-07-06 DIAGNOSIS — E876 Hypokalemia: Secondary | ICD-10-CM | POA: Diagnosis present

## 2019-07-06 DIAGNOSIS — F329 Major depressive disorder, single episode, unspecified: Secondary | ICD-10-CM | POA: Diagnosis present

## 2019-07-06 DIAGNOSIS — D696 Thrombocytopenia, unspecified: Secondary | ICD-10-CM | POA: Diagnosis present

## 2019-07-06 DIAGNOSIS — R6521 Severe sepsis with septic shock: Secondary | ICD-10-CM | POA: Diagnosis present

## 2019-07-06 DIAGNOSIS — M8458XA Pathological fracture in neoplastic disease, other specified site, initial encounter for fracture: Secondary | ICD-10-CM | POA: Diagnosis present

## 2019-07-06 DIAGNOSIS — Z90710 Acquired absence of both cervix and uterus: Secondary | ICD-10-CM

## 2019-07-06 DIAGNOSIS — T426X5A Adverse effect of other antiepileptic and sedative-hypnotic drugs, initial encounter: Secondary | ICD-10-CM | POA: Diagnosis not present

## 2019-07-06 DIAGNOSIS — R0603 Acute respiratory distress: Secondary | ICD-10-CM | POA: Diagnosis present

## 2019-07-06 DIAGNOSIS — Z87891 Personal history of nicotine dependence: Secondary | ICD-10-CM

## 2019-07-06 DIAGNOSIS — Z923 Personal history of irradiation: Secondary | ICD-10-CM

## 2019-07-06 DIAGNOSIS — Z7901 Long term (current) use of anticoagulants: Secondary | ICD-10-CM

## 2019-07-06 DIAGNOSIS — J9600 Acute respiratory failure, unspecified whether with hypoxia or hypercapnia: Secondary | ICD-10-CM

## 2019-07-06 DIAGNOSIS — Z9221 Personal history of antineoplastic chemotherapy: Secondary | ICD-10-CM

## 2019-07-06 LAB — COMPREHENSIVE METABOLIC PANEL
ALT: 25 U/L (ref 0–44)
AST: 52 U/L — ABNORMAL HIGH (ref 15–41)
Albumin: 2.7 g/dL — ABNORMAL LOW (ref 3.5–5.0)
Alkaline Phosphatase: 210 U/L — ABNORMAL HIGH (ref 38–126)
Anion gap: 19 — ABNORMAL HIGH (ref 5–15)
BUN: 19 mg/dL (ref 6–20)
CO2: 20 mmol/L — ABNORMAL LOW (ref 22–32)
Calcium: 8.1 mg/dL — ABNORMAL LOW (ref 8.9–10.3)
Chloride: 92 mmol/L — ABNORMAL LOW (ref 98–111)
Creatinine, Ser: 0.75 mg/dL (ref 0.44–1.00)
GFR calc Af Amer: >60 mL/min (ref >60)
GFR calc non Af Amer: >60 mL/min (ref >60)
Glucose, Bld: 71 mg/dL (ref 70–99)
Potassium: 3.1 mmol/L — ABNORMAL LOW (ref 3.5–5.1)
Sodium: 131 mmol/L — ABNORMAL LOW (ref 135–145)
Total Bilirubin: 1.1 mg/dL (ref 0.3–1.2)
Total Protein: 7.2 g/dL (ref 6.5–8.1)

## 2019-07-06 LAB — CBC WITH DIFFERENTIAL/PLATELET
Abs Immature Granulocytes: 0.05 10*3/uL (ref 0.00–0.07)
Basophils Absolute: 0 10*3/uL (ref 0.0–0.1)
Basophils Relative: 0 %
Eosinophils Absolute: 0 10*3/uL (ref 0.0–0.5)
Eosinophils Relative: 0 %
HCT: 36.8 % (ref 36.0–46.0)
Hemoglobin: 12.1 g/dL (ref 12.0–15.0)
Immature Granulocytes: 0 %
Lymphocytes Relative: 2 %
Lymphs Abs: 0.3 10*3/uL — ABNORMAL LOW (ref 0.7–4.0)
MCH: 36.4 pg — ABNORMAL HIGH (ref 26.0–34.0)
MCHC: 32.9 g/dL (ref 30.0–36.0)
MCV: 110.8 fL — ABNORMAL HIGH (ref 80.0–100.0)
Monocytes Absolute: 0.3 10*3/uL (ref 0.1–1.0)
Monocytes Relative: 3 %
Neutro Abs: 11.3 10*3/uL — ABNORMAL HIGH (ref 1.7–7.7)
Neutrophils Relative %: 95 %
Platelets: 135 10*3/uL — ABNORMAL LOW (ref 150–400)
RBC: 3.32 MIL/uL — ABNORMAL LOW (ref 3.87–5.11)
RDW: 17.2 % — ABNORMAL HIGH (ref 11.5–15.5)
WBC: 12 10*3/uL — ABNORMAL HIGH (ref 4.0–10.5)
nRBC: 0.5 % — ABNORMAL HIGH (ref 0.0–0.2)

## 2019-07-06 LAB — URINALYSIS, ROUTINE W REFLEX MICROSCOPIC
Bacteria, UA: NONE SEEN
Bilirubin Urine: NEGATIVE
Glucose, UA: NEGATIVE mg/dL
Ketones, ur: 80 mg/dL — AB
Leukocytes,Ua: NEGATIVE
Nitrite: NEGATIVE
Protein, ur: 100 mg/dL — AB
Specific Gravity, Urine: 1.017 (ref 1.005–1.030)
pH: 6 (ref 5.0–8.0)

## 2019-07-06 LAB — HEMOGLOBIN A1C
Hgb A1c MFr Bld: 5.5 % (ref 4.8–5.6)
Mean Plasma Glucose: 111.15 mg/dL

## 2019-07-06 LAB — CBC
HCT: 32.8 % — ABNORMAL LOW (ref 36.0–46.0)
Hemoglobin: 10.3 g/dL — ABNORMAL LOW (ref 12.0–15.0)
MCH: 36 pg — ABNORMAL HIGH (ref 26.0–34.0)
MCHC: 31.4 g/dL (ref 30.0–36.0)
MCV: 114.7 fL — ABNORMAL HIGH (ref 80.0–100.0)
Platelets: 121 10*3/uL — ABNORMAL LOW (ref 150–400)
RBC: 2.86 MIL/uL — ABNORMAL LOW (ref 3.87–5.11)
RDW: 17.2 % — ABNORMAL HIGH (ref 11.5–15.5)
WBC: 7.7 10*3/uL (ref 4.0–10.5)
nRBC: 0.8 % — ABNORMAL HIGH (ref 0.0–0.2)

## 2019-07-06 LAB — PROTIME-INR
INR: 1.5 — ABNORMAL HIGH (ref 0.8–1.2)
Prothrombin Time: 17.9 seconds — ABNORMAL HIGH (ref 11.4–15.2)

## 2019-07-06 LAB — BLOOD GAS, ARTERIAL
Acid-base deficit: 3.5 mmol/L — ABNORMAL HIGH (ref 0.0–2.0)
Acid-base deficit: 4.2 mmol/L — ABNORMAL HIGH (ref 0.0–2.0)
Bicarbonate: 19.7 mmol/L — ABNORMAL LOW (ref 20.0–28.0)
Bicarbonate: 19.7 mmol/L — ABNORMAL LOW (ref 20.0–28.0)
Drawn by: 270211
FIO2: 100
MECHVT: 430 mL
O2 Saturation: 94 %
O2 Saturation: 98.7 %
PEEP: 5 cmH2O
Patient temperature: 98.6
Patient temperature: 99.4
RATE: 20 resp/min
pCO2 arterial: 31.1 mmHg — ABNORMAL LOW (ref 32.0–48.0)
pCO2 arterial: 34.7 mmHg (ref 32.0–48.0)
pH, Arterial: 7.418 (ref 7.350–7.450)
pH, Arterial: 7.375 (ref 7.350–7.450)
pO2, Arterial: 84.7 mmHg (ref 83.0–108.0)
pO2, Arterial: 360 mmHg — ABNORMAL HIGH (ref 83.0–108.0)

## 2019-07-06 LAB — APTT: aPTT: 53 seconds — ABNORMAL HIGH (ref 24–36)

## 2019-07-06 LAB — LACTIC ACID, PLASMA
Lactic Acid, Venous: 2 mmol/L (ref 0.5–1.9)
Lactic Acid, Venous: 1.8 mmol/L (ref 0.5–1.9)
Lactic Acid, Venous: 1.4 mmol/L (ref 0.5–1.9)

## 2019-07-06 LAB — SARS CORONAVIRUS 2 BY RT PCR (HOSPITAL ORDER, PERFORMED IN ~~LOC~~ HOSPITAL LAB): SARS Coronavirus 2: NEGATIVE

## 2019-07-06 LAB — GLUCOSE, CAPILLARY
Glucose-Capillary: 73 mg/dL (ref 70–99)
Glucose-Capillary: 76 mg/dL (ref 70–99)
Glucose-Capillary: 80 mg/dL (ref 70–99)
Glucose-Capillary: 81 mg/dL (ref 70–99)

## 2019-07-06 LAB — CREATININE, SERUM
Creatinine, Ser: 0.63 mg/dL (ref 0.44–1.00)
GFR calc Af Amer: >60 mL/min (ref >60)
GFR calc non Af Amer: >60 mL/min (ref >60)

## 2019-07-06 LAB — STREP PNEUMONIAE URINARY ANTIGEN: Strep Pneumo Urinary Antigen: POSITIVE — AB

## 2019-07-06 LAB — MRSA PCR SCREENING: MRSA by PCR: NEGATIVE

## 2019-07-06 LAB — CORTISOL: Cortisol, Plasma: 24.9 ug/dL

## 2019-07-06 LAB — PROCALCITONIN: Procalcitonin: 29.87 ng/mL

## 2019-07-06 LAB — HCG, QUANTITATIVE, PREGNANCY: hCG, Beta Chain, Quant, S: 1 m[IU]/mL (ref <5)

## 2019-07-06 LAB — POC SARS CORONAVIRUS 2 AG -  ED: SARS Coronavirus 2 Ag: NEGATIVE

## 2019-07-06 IMAGING — DX DG CHEST 1V PORT
1 series · 1 of 1 positions shown · non-contrast
Comparison: Chest x-ray dated [DATE].

CLINICAL DATA: Sepsis.  Hypoxia.

EXAM:
PORTABLE CHEST 1 VIEW

[chest ap]
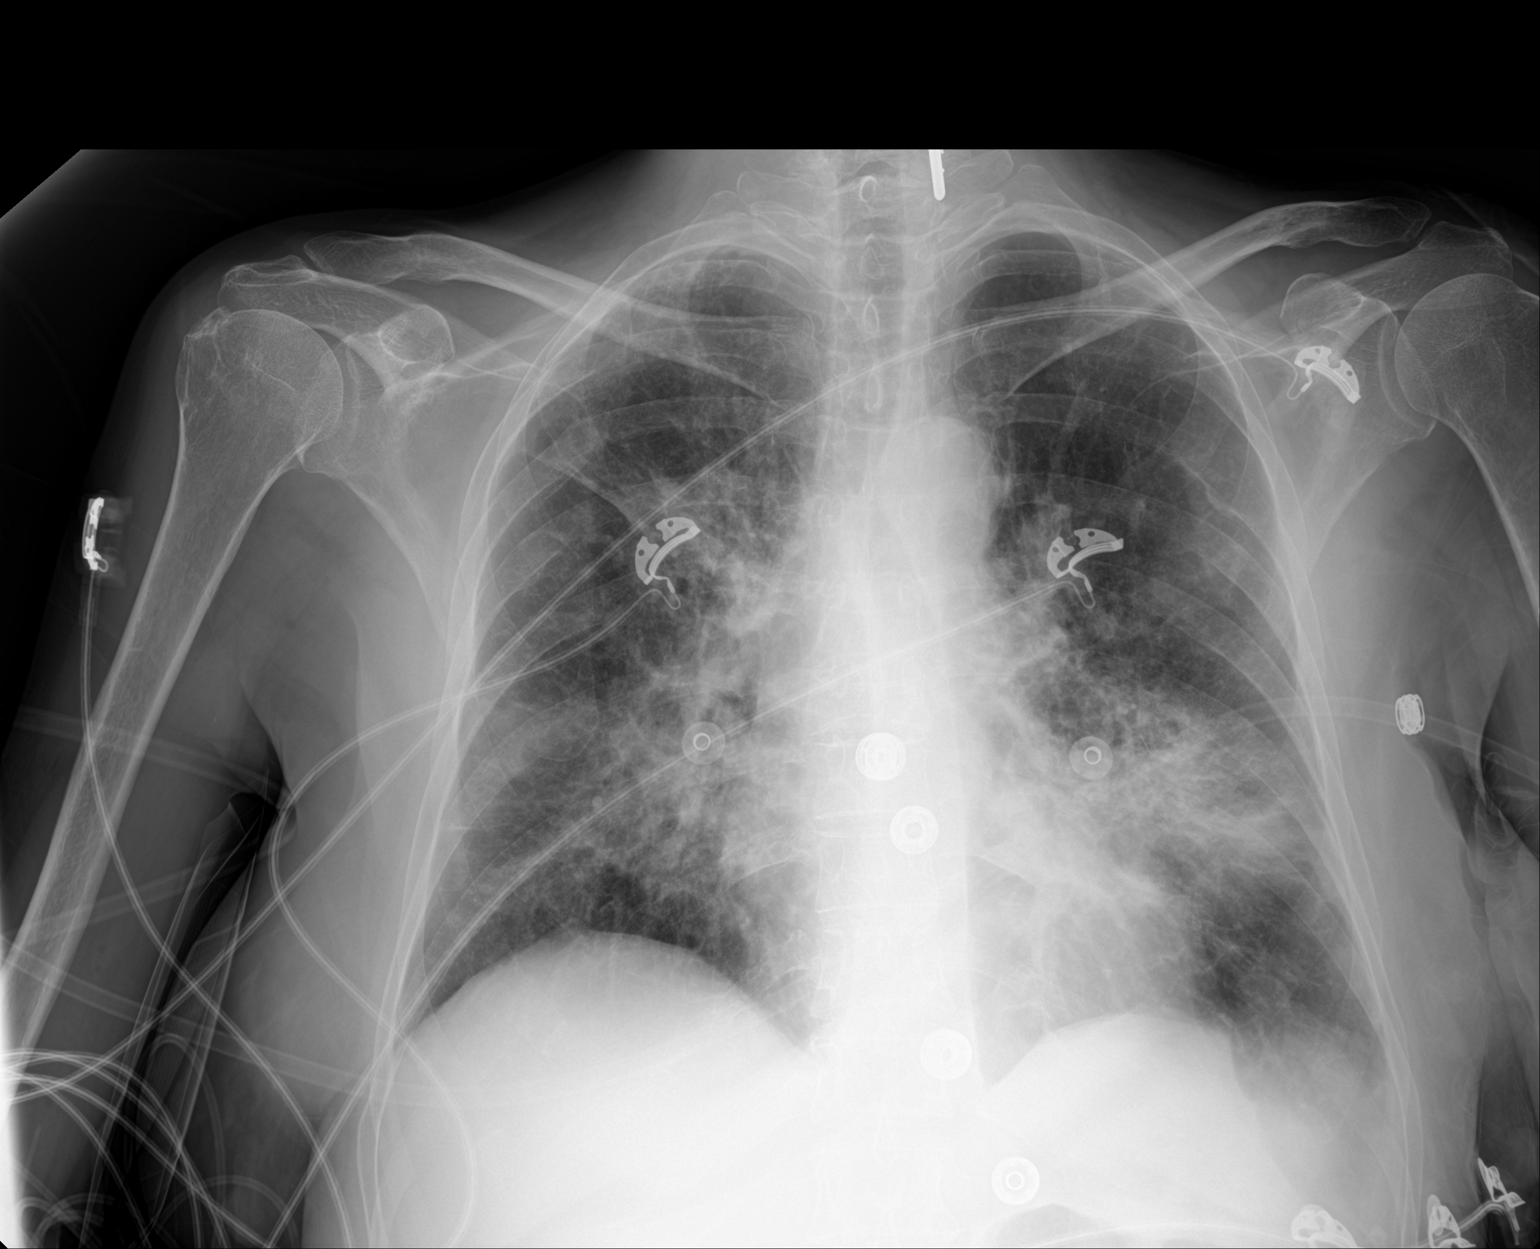

[1 of 1 positions shown; findings below may reference images not displayed]

FINDINGS: The heart size and mediastinal contours are within normal limits.
Normal pulmonary vascularity. The lungs remain hyperinflated with
emphysematous changes and scattered interstitial coarsening. New
patchy consolidation in the lingula, right middle lobe, and right
upper lobe. No pleural effusion or pneumothorax. No acute osseous
abnormality.
IMPRESSION: 1. New multifocal pneumonia.
2. COPD.

## 2019-07-06 IMAGING — DX DG ABDOMEN 1V
1 series · 1 of 1 positions shown · non-contrast
Comparison: None.

CLINICAL DATA: Check gastric catheter placement

EXAM:
ABDOMEN - 1 VIEW

[abdomen kub]
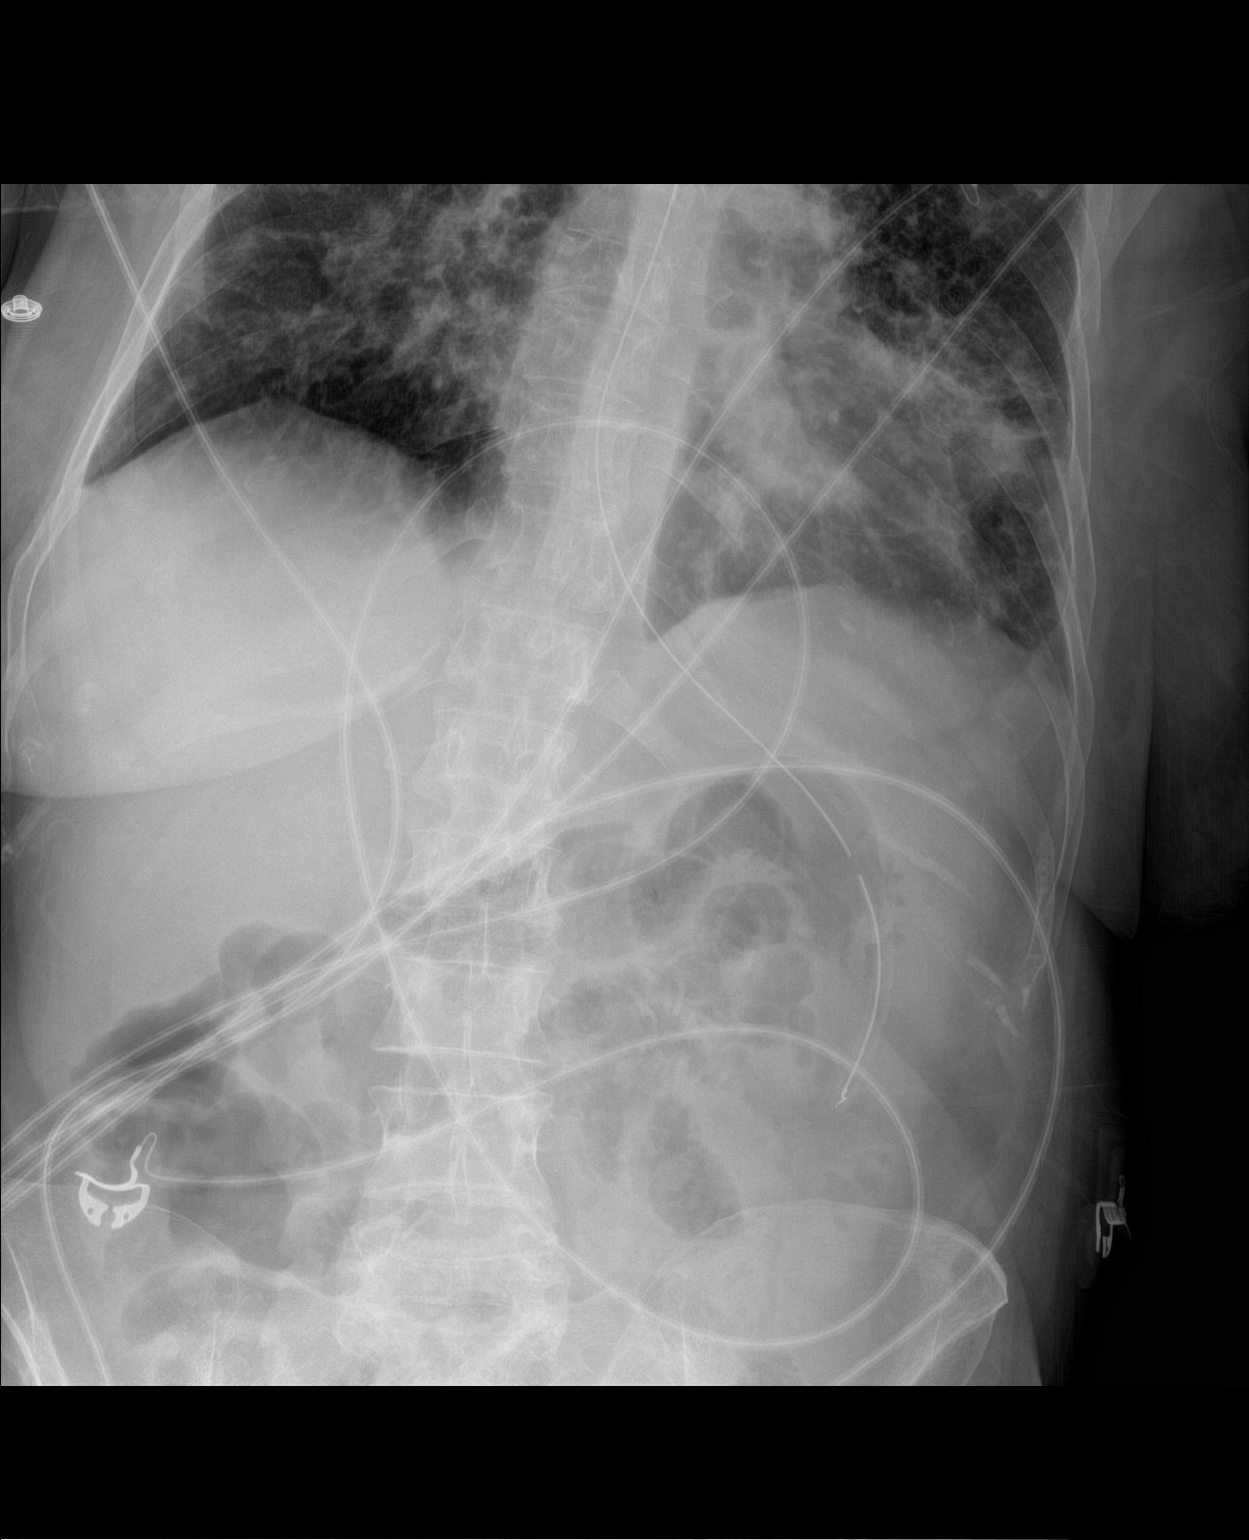

[1 of 1 positions shown; findings below may reference images not displayed]

FINDINGS: Gastric catheter is noted within the mid stomach. Stable bilateral
pulmonary infiltrates are seen. Scattered large and small bowel gas
is noted.
IMPRESSION: Gastric catheter within the stomach.

## 2019-07-06 IMAGING — DX DG CHEST 1V PORT
2 series · 2 of 2 positions shown · non-contrast
Comparison: [DATE]

CLINICAL DATA: Endotracheal placement

EXAM:
PORTABLE CHEST 1 VIEW

[chest ap (1 of 2)]
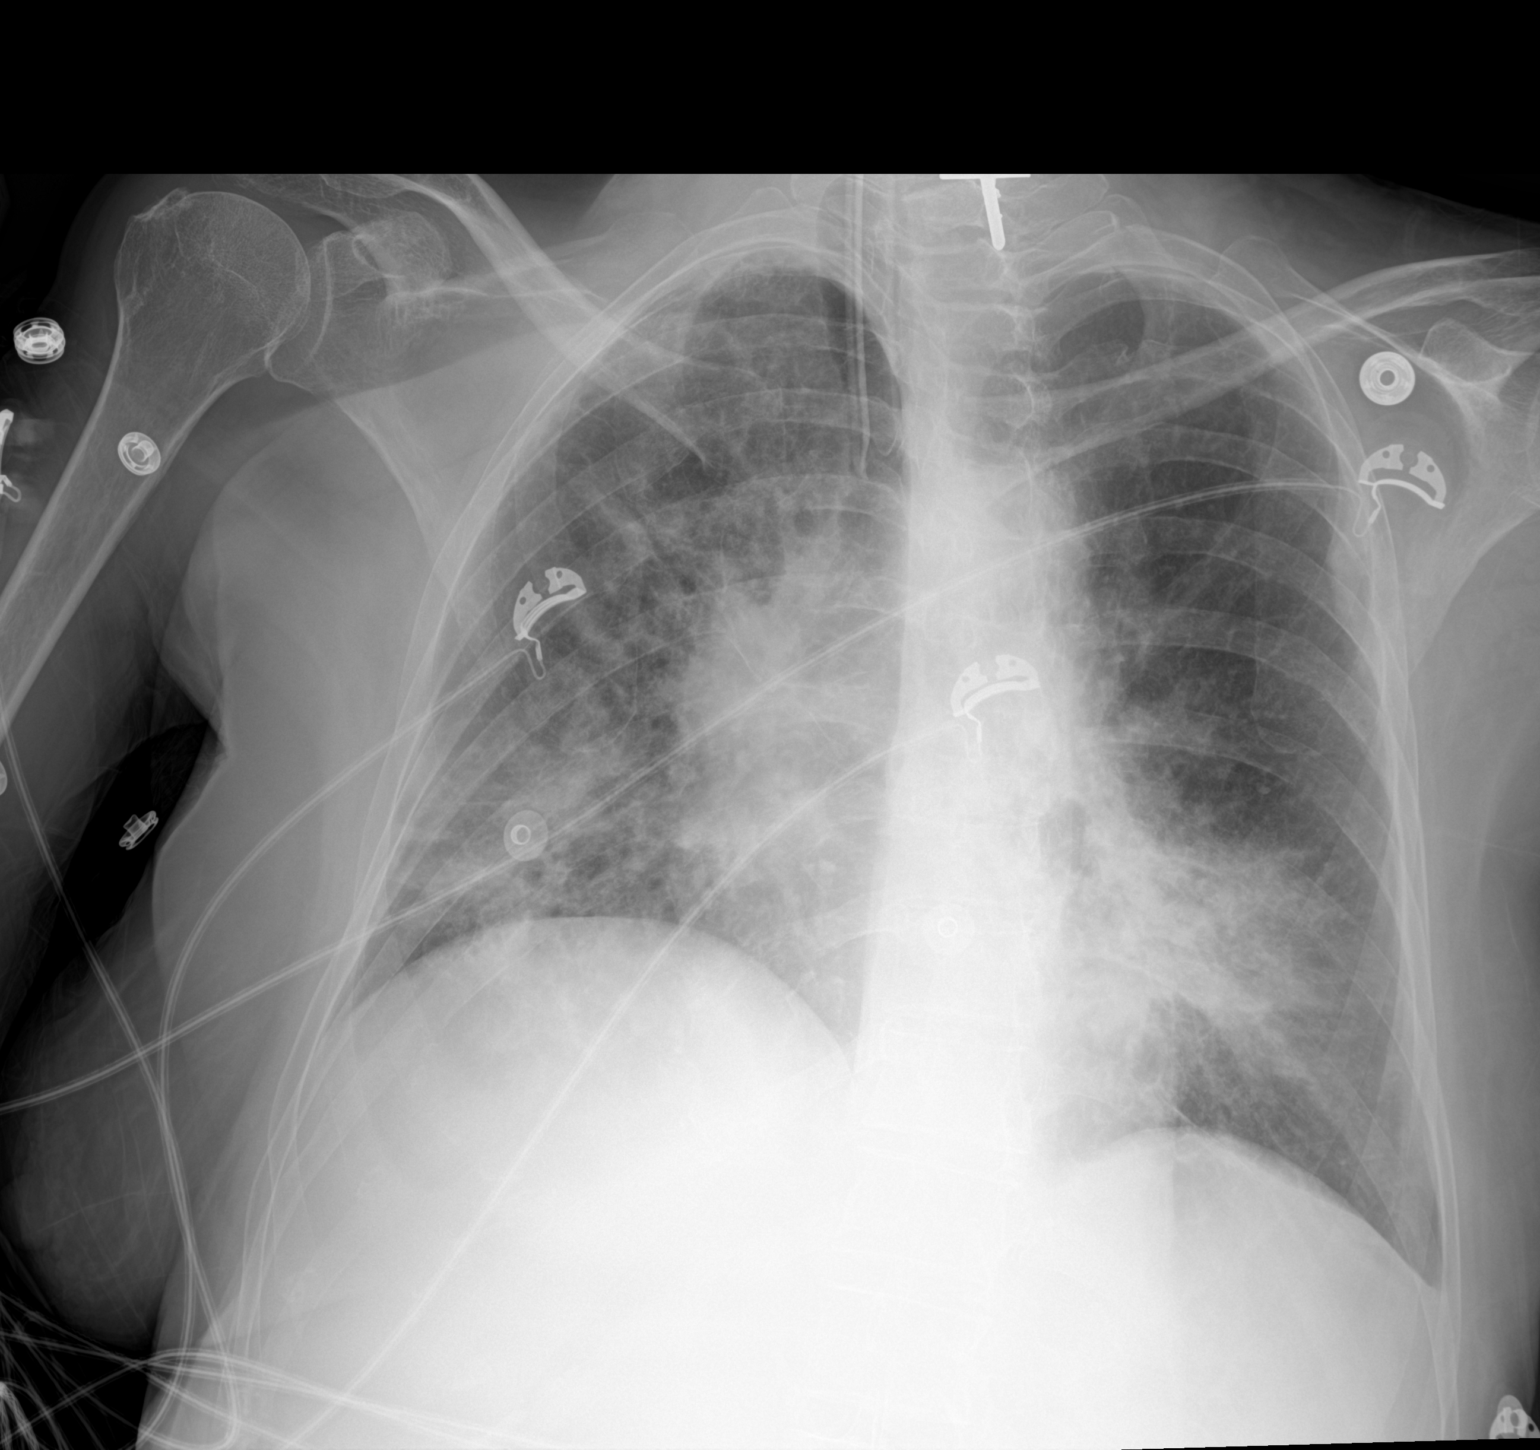

[chest ap (2 of 2)]
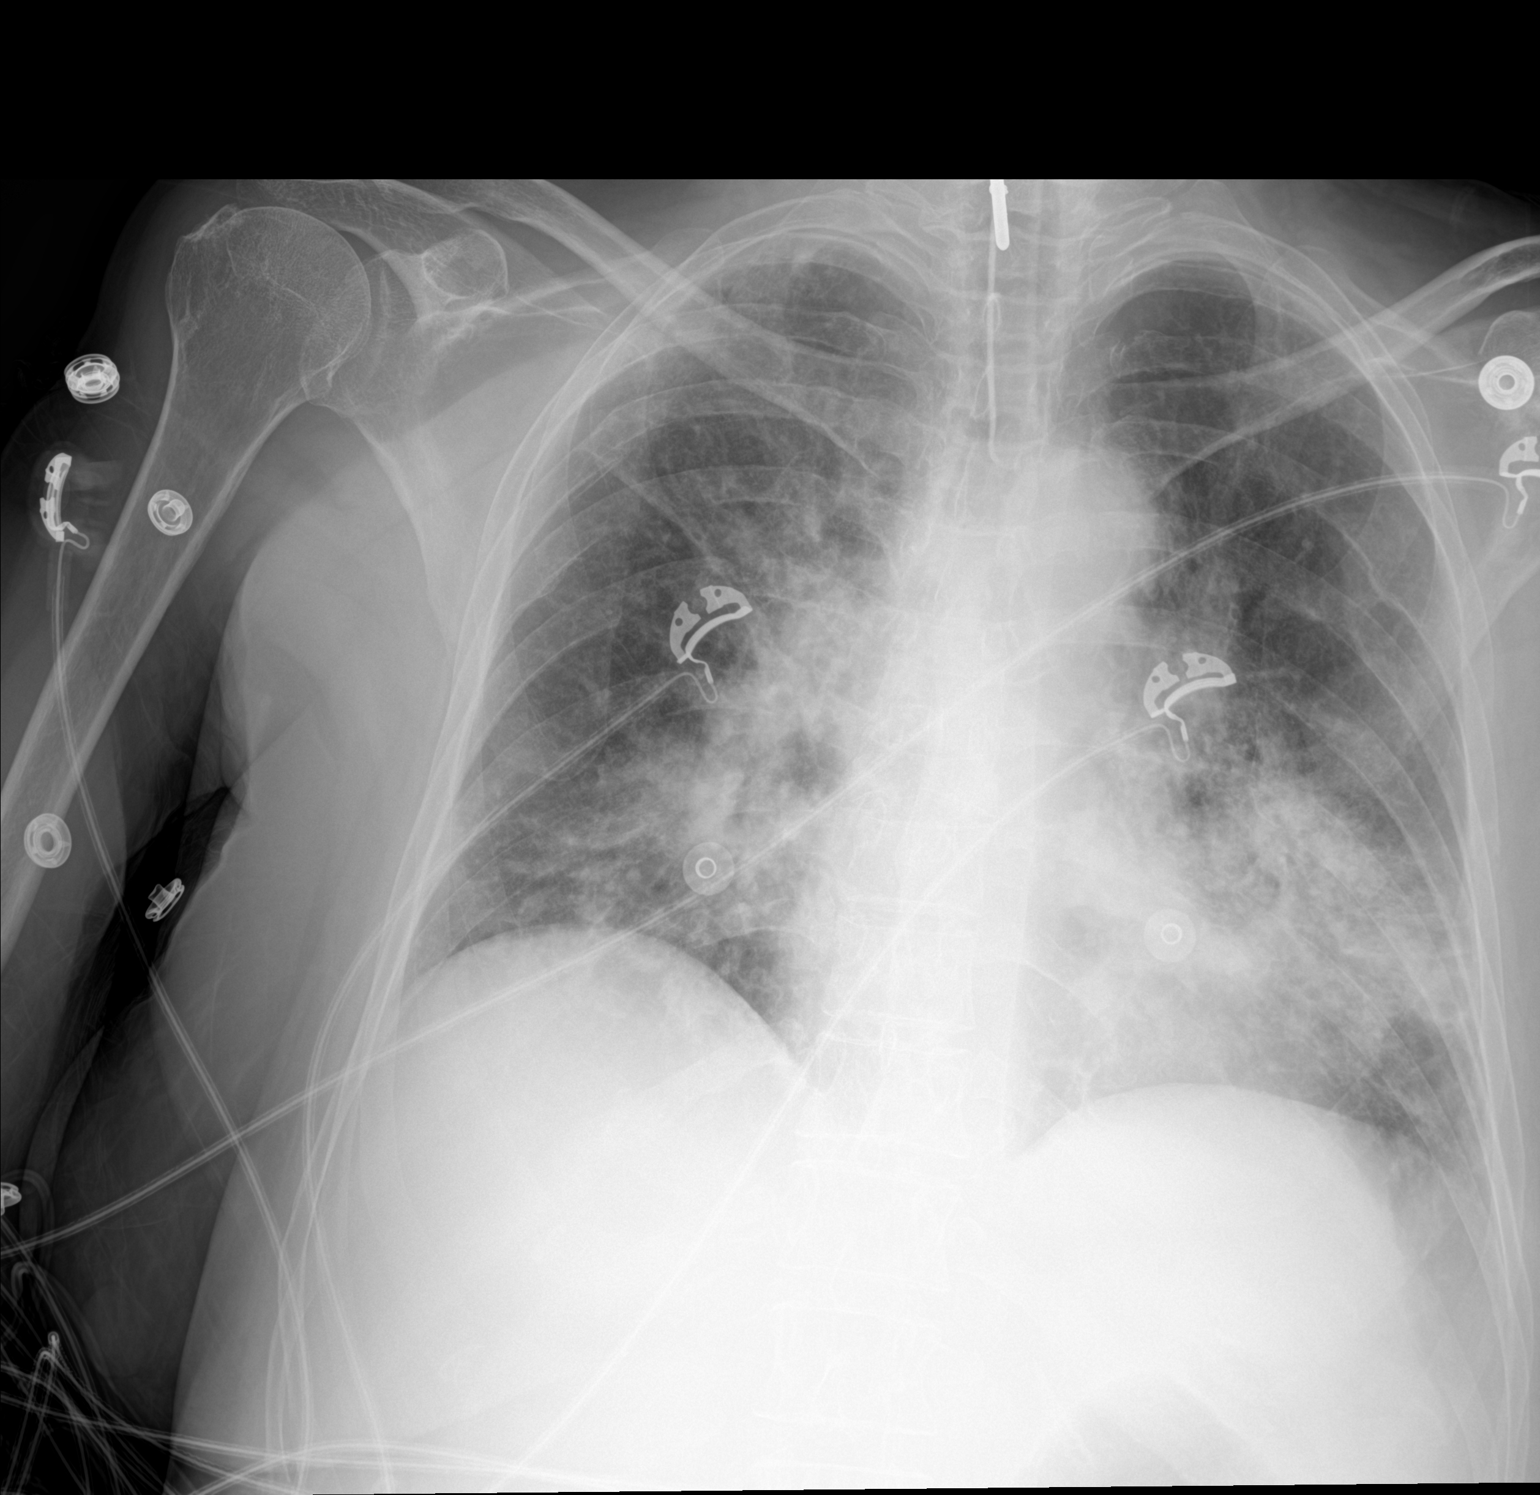

[2 of 2 positions shown; findings below may reference images not displayed]

FINDINGS: Endotracheal tube tip is 3.5 cm above the carina. Widespread
bilateral pulmonary infiltrates as seen previously.
IMPRESSION: Endotracheal tube tip 3.5 cm above the carina.

## 2019-07-06 MED ORDER — SUCCINYLCHOLINE CHLORIDE 200 MG/10ML IV SOSY
PREFILLED_SYRINGE | INTRAVENOUS | Status: AC
  Filled 2019-07-06: qty 10

## 2019-07-06 MED ORDER — DOCUSATE SODIUM 50 MG/5ML PO LIQD
100.0000 mg | Freq: Two times a day (BID) | ORAL | Status: DC
  Administered 2019-07-06 – 2019-07-09 (×6): 100 mg via ORAL
  Filled 2019-07-06 (×6): qty 10

## 2019-07-06 MED ORDER — CHLORHEXIDINE GLUCONATE 0.12% ORAL RINSE (MEDLINE KIT)
15.0000 mL | Freq: Two times a day (BID) | OROMUCOSAL | Status: DC
  Administered 2019-07-06 – 2019-07-09 (×6): 15 mL via OROMUCOSAL

## 2019-07-06 MED ORDER — FENTANYL CITRATE (PF) 100 MCG/2ML IJ SOLN
100.0000 ug | INTRAMUSCULAR | Status: DC | PRN
  Administered 2019-07-06: 100 ug via INTRAVENOUS
  Filled 2019-07-06 (×2): qty 2

## 2019-07-06 MED ORDER — SODIUM CHLORIDE 0.9 % IV BOLUS
1000.0000 mL | Freq: Once | INTRAVENOUS | Status: AC
  Administered 2019-07-06: 1000 mL via INTRAVENOUS

## 2019-07-06 MED ORDER — LORAZEPAM 2 MG/ML IJ SOLN
0.5000 mg | Freq: Once | INTRAMUSCULAR | Status: AC
  Administered 2019-07-06: 0.5 mg via INTRAVENOUS
  Filled 2019-07-06: qty 1

## 2019-07-06 MED ORDER — DEXMEDETOMIDINE HCL IN NACL 200 MCG/50ML IV SOLN
0.0000 ug/kg/h | INTRAVENOUS | Status: DC
  Administered 2019-07-06: 0.5 ug/kg/h via INTRAVENOUS
  Administered 2019-07-06: 0.4 ug/kg/h via INTRAVENOUS
  Administered 2019-07-07: 0.5 ug/kg/h via INTRAVENOUS
  Administered 2019-07-08 – 2019-07-09 (×2): 0.3 ug/kg/h via INTRAVENOUS
  Filled 2019-07-06 (×6): qty 50

## 2019-07-06 MED ORDER — SODIUM CHLORIDE 0.9 % IV SOLN: 250.0000 mL | INTRAVENOUS | Status: DC

## 2019-07-06 MED ORDER — MIDAZOLAM 50MG/50ML (1MG/ML) PREMIX INFUSION: 0.5000 mg/h | INTRAVENOUS | Status: DC

## 2019-07-06 MED ORDER — SUCCINYLCHOLINE CHLORIDE 20 MG/ML IJ SOLN
1.5000 mg/kg | Freq: Once | INTRAMUSCULAR | Status: AC
  Administered 2019-07-06: 76.6 mg via INTRAVENOUS
  Filled 2019-07-06: qty 3.83

## 2019-07-06 MED ORDER — VANCOMYCIN HCL IN DEXTROSE 1-5 GM/200ML-% IV SOLN
1000.0000 mg | Freq: Once | INTRAVENOUS | Status: AC
  Administered 2019-07-06: 1000 mg via INTRAVENOUS
  Filled 2019-07-06: qty 200

## 2019-07-06 MED ORDER — MIDAZOLAM HCL 2 MG/2ML IJ SOLN
2.0000 mg | INTRAMUSCULAR | Status: DC | PRN
  Filled 2019-07-06: qty 2

## 2019-07-06 MED ORDER — PROPOFOL 500 MG/50ML IV EMUL
INTRAVENOUS | Status: AC
  Filled 2019-07-06: qty 50

## 2019-07-06 MED ORDER — VANCOMYCIN HCL 750 MG/150ML IV SOLN
750.0000 mg | Freq: Two times a day (BID) | INTRAVENOUS | Status: DC
  Administered 2019-07-06: 750 mg via INTRAVENOUS
  Filled 2019-07-06 (×2): qty 150

## 2019-07-06 MED ORDER — LACTATED RINGERS IV SOLN: INTRAVENOUS | Status: DC

## 2019-07-06 MED ORDER — ETOMIDATE 2 MG/ML IV SOLN
INTRAVENOUS | Status: AC
  Filled 2019-07-06: qty 20

## 2019-07-06 MED ORDER — ORAL CARE MOUTH RINSE
15.0000 mL | OROMUCOSAL | Status: DC
  Administered 2019-07-06 – 2019-07-09 (×19): 15 mL via OROMUCOSAL

## 2019-07-06 MED ORDER — ACETAMINOPHEN 160 MG/5ML PO SOLN
650.0000 mg | Freq: Once | ORAL | Status: AC
  Administered 2019-07-06: 650 mg
  Filled 2019-07-06: qty 20.3

## 2019-07-06 MED ORDER — LIDOCAINE HCL (CARDIAC) PF 100 MG/5ML IV SOSY
PREFILLED_SYRINGE | INTRAVENOUS | Status: AC
  Filled 2019-07-06: qty 5

## 2019-07-06 MED ORDER — SODIUM CHLORIDE 0.9 % IV SOLN
2.0000 g | Freq: Three times a day (TID) | INTRAVENOUS | Status: DC
  Administered 2019-07-06 – 2019-07-07 (×2): 2 g via INTRAVENOUS
  Filled 2019-07-06 (×2): qty 2

## 2019-07-06 MED ORDER — ENOXAPARIN SODIUM 40 MG/0.4ML ~~LOC~~ SOLN
40.0000 mg | SUBCUTANEOUS | Status: DC
  Administered 2019-07-06 – 2019-07-10 (×5): 40 mg via SUBCUTANEOUS
  Filled 2019-07-06 (×5): qty 0.4

## 2019-07-06 MED ORDER — PANTOPRAZOLE SODIUM 40 MG IV SOLR
40.0000 mg | Freq: Every day | INTRAVENOUS | Status: DC
  Administered 2019-07-06 – 2019-07-09 (×4): 40 mg via INTRAVENOUS
  Filled 2019-07-06 (×3): qty 40

## 2019-07-06 MED ORDER — OLANZAPINE 5 MG PO TABS
5.0000 mg | ORAL_TABLET | Freq: Two times a day (BID) | ORAL | Status: DC
  Administered 2019-07-06 – 2019-07-09 (×8): 5 mg
  Filled 2019-07-06 (×9): qty 1

## 2019-07-06 MED ORDER — LACTATED RINGERS IV BOLUS
1000.0000 mL | Freq: Once | INTRAVENOUS | Status: AC
  Administered 2019-07-06: 1000 mL via INTRAVENOUS

## 2019-07-06 MED ORDER — ETOMIDATE 2 MG/ML IV SOLN
0.3000 mg/kg | Freq: Once | INTRAVENOUS | Status: AC
  Administered 2019-07-06: 15.3 mg via INTRAVENOUS

## 2019-07-06 MED ORDER — LEVETIRACETAM 100 MG/ML PO SOLN
500.0000 mg | Freq: Two times a day (BID) | ORAL | Status: DC
  Administered 2019-07-06 – 2019-07-09 (×8): 500 mg
  Filled 2019-07-06 (×10): qty 5

## 2019-07-06 MED ORDER — MIDAZOLAM HCL 2 MG/2ML IJ SOLN
2.0000 mg | INTRAMUSCULAR | Status: AC | PRN
  Administered 2019-07-06 (×2): 2 mg via INTRAVENOUS
  Filled 2019-07-06: qty 2

## 2019-07-06 MED ORDER — SODIUM CHLORIDE 0.9 % IV SOLN
2.0000 g | Freq: Once | INTRAVENOUS | Status: AC
  Administered 2019-07-06: 2 g via INTRAVENOUS
  Filled 2019-07-06: qty 2

## 2019-07-06 MED ORDER — FENTANYL CITRATE (PF) 100 MCG/2ML IJ SOLN
50.0000 ug | INTRAMUSCULAR | Status: DC | PRN
  Administered 2019-07-07 (×2): 50 ug via INTRAVENOUS
  Filled 2019-07-06 (×3): qty 2

## 2019-07-06 MED ORDER — POLYETHYLENE GLYCOL 3350 17 G PO PACK
17.0000 g | PACK | Freq: Every day | ORAL | Status: DC
  Administered 2019-07-07 – 2019-07-09 (×3): 17 g via ORAL
  Filled 2019-07-06 (×5): qty 1

## 2019-07-06 MED ORDER — FENTANYL CITRATE (PF) 100 MCG/2ML IJ SOLN
INTRAMUSCULAR | Status: AC
  Administered 2019-07-06: 100 ug via INTRAVENOUS
  Filled 2019-07-06: qty 2

## 2019-07-06 MED ORDER — MIDAZOLAM HCL 2 MG/2ML IJ SOLN
INTRAMUSCULAR | Status: AC
  Administered 2019-07-06: 2 mg via INTRAVENOUS
  Filled 2019-07-06: qty 4

## 2019-07-06 MED ORDER — CHLORHEXIDINE GLUCONATE CLOTH 2 % EX PADS
6.0000 | MEDICATED_PAD | Freq: Every day | CUTANEOUS | Status: DC
  Administered 2019-07-07 – 2019-07-09 (×3): 6 via TOPICAL

## 2019-07-06 MED ORDER — FENTANYL CITRATE (PF) 100 MCG/2ML IJ SOLN
100.0000 ug | INTRAMUSCULAR | Status: DC | PRN
  Administered 2019-07-06: 100 ug via INTRAVENOUS

## 2019-07-06 MED ORDER — POTASSIUM CHLORIDE 20 MEQ/15ML (10%) PO SOLN
40.0000 meq | ORAL | Status: AC
  Administered 2019-07-06 (×2): 40 meq
  Filled 2019-07-06 (×2): qty 30

## 2019-07-06 MED ORDER — ROCURONIUM BROMIDE 10 MG/ML (PF) SYRINGE
PREFILLED_SYRINGE | INTRAVENOUS | Status: AC
  Filled 2019-07-06: qty 10

## 2019-07-06 MED ORDER — OXYCODONE HCL 5 MG/5ML PO SOLN
10.0000 mg | ORAL | Status: DC
  Administered 2019-07-06 – 2019-07-08 (×11): 10 mg
  Filled 2019-07-06 (×11): qty 10

## 2019-07-06 MED ORDER — FENTANYL 2500MCG IN NS 250ML (10MCG/ML) PREMIX INFUSION: 0.0000 ug/h | INTRAVENOUS | Status: DC

## 2019-07-06 MED ORDER — METRONIDAZOLE IN NACL 5-0.79 MG/ML-% IV SOLN
500.0000 mg | Freq: Once | INTRAVENOUS | Status: AC
  Administered 2019-07-06: 500 mg via INTRAVENOUS
  Filled 2019-07-06: qty 100

## 2019-07-06 MED ORDER — PHENYLEPHRINE HCL-NACL 10-0.9 MG/250ML-% IV SOLN
25.0000 ug/min | INTRAVENOUS | Status: DC
  Administered 2019-07-06: 25 ug/min via INTRAVENOUS
  Administered 2019-07-06: 110 ug/min via INTRAVENOUS
  Administered 2019-07-06: 95 ug/min via INTRAVENOUS
  Administered 2019-07-06: 120 ug/min via INTRAVENOUS
  Administered 2019-07-06: 75 ug/min via INTRAVENOUS
  Administered 2019-07-07: 70 ug/min via INTRAVENOUS
  Administered 2019-07-07: 65 ug/min via INTRAVENOUS
  Administered 2019-07-07: 120 ug/min via INTRAVENOUS
  Administered 2019-07-07: 50 ug/min via INTRAVENOUS
  Administered 2019-07-07: 120 ug/min via INTRAVENOUS
  Administered 2019-07-07: 60 ug/min via INTRAVENOUS
  Administered 2019-07-07: 70 ug/min via INTRAVENOUS
  Administered 2019-07-07: 60 ug/min via INTRAVENOUS
  Administered 2019-07-07: 120 ug/min via INTRAVENOUS
  Administered 2019-07-08: 40 ug/min via INTRAVENOUS
  Administered 2019-07-08: 30 ug/min via INTRAVENOUS
  Administered 2019-07-08 (×2): 40 ug/min via INTRAVENOUS
  Filled 2019-07-06 (×24): qty 250

## 2019-07-06 MED ORDER — FENTANYL CITRATE (PF) 100 MCG/2ML IJ SOLN
50.0000 ug | INTRAMUSCULAR | Status: DC | PRN
  Administered 2019-07-07 (×2): 100 ug via INTRAVENOUS
  Administered 2019-07-07: 50 ug via INTRAVENOUS
  Administered 2019-07-08 (×2): 100 ug via INTRAVENOUS
  Administered 2019-07-08: 150 ug via INTRAVENOUS
  Administered 2019-07-08 (×2): 100 ug via INTRAVENOUS
  Administered 2019-07-08: 150 ug via INTRAVENOUS
  Administered 2019-07-08: 100 ug via INTRAVENOUS
  Administered 2019-07-08: 200 ug via INTRAVENOUS
  Administered 2019-07-08: 150 ug via INTRAVENOUS
  Administered 2019-07-09: 200 ug via INTRAVENOUS
  Administered 2019-07-09 (×5): 100 ug via INTRAVENOUS
  Administered 2019-07-09: 200 ug via INTRAVENOUS
  Administered 2019-07-09: 100 ug via INTRAVENOUS
  Administered 2019-07-09 (×2): 200 ug via INTRAVENOUS
  Administered 2019-07-09 (×2): 100 ug via INTRAVENOUS
  Administered 2019-07-10 (×2): 200 ug via INTRAVENOUS
  Filled 2019-07-06 (×2): qty 2
  Filled 2019-07-06 (×2): qty 4
  Filled 2019-07-06 (×4): qty 2
  Filled 2019-07-06: qty 4
  Filled 2019-07-06: qty 2
  Filled 2019-07-06 (×3): qty 4
  Filled 2019-07-06: qty 2
  Filled 2019-07-06: qty 4
  Filled 2019-07-06 (×3): qty 2
  Filled 2019-07-06: qty 4
  Filled 2019-07-06 (×3): qty 2
  Filled 2019-07-06: qty 4
  Filled 2019-07-06: qty 2
  Filled 2019-07-06: qty 4

## 2019-07-06 MED ORDER — INSULIN ASPART 100 UNIT/ML ~~LOC~~ SOLN
0.0000 [IU] | SUBCUTANEOUS | Status: DC
  Filled 2019-07-06: qty 0.06

## 2019-07-06 MED ORDER — ACETAMINOPHEN 650 MG RE SUPP
650.0000 mg | Freq: Once | RECTAL | Status: AC
  Administered 2019-07-06: 650 mg via RECTAL
  Filled 2019-07-06: qty 1

## 2019-07-06 NOTE — H&P (Signed)
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  Maintained on maintenance therapy (pemetrexed extended to Q4W intervals). admitted 5/13 with sepsis and working diagnosis of pneumonia  History of present illness    54 year old female with history of stage IV lung cancer as mentioned below,  presents to the emergency room on 5/13 with change in mental status, decreased activity tolerance, worsening weakness.  On initial exam found to be tachypneic, hypoxic requiring BiPAP, chest x-ray consistent with multifocal pneumonia initial lactate 2, white blood cell count 12.  Decline in spite of antibiotics, IV hydration and BiPAP therapy requiring intubation pulmonary asked to admit following intubation Past Medical History   Lung cancer, adenocarcinoma of the right upper lobe with metastasis to brain bone, liver completed GT radiosurgery to 4 lesions in brain 2017,completed 4 cycles of 2nd-line standard chemotherapy with carboplatin and pemetrexed. Bevacizumab was held second to PEs/lovenox Rx. She had radiographic decrease in primary lung mass. Decreased RUL lung mass noted in Dec 2020 and initiated maintenance Pemetrexed.   prior pulmonary emboli, depression, GERD, hypertension, asthma, COPD anxiety. Significant Hospital Events     Consults:    Procedures:  oett 5/13>>>  Significant Diagnostic Tests:  eeg 5/13>>>  Micro Data:  sars 5/13: neg rvp 5/13>>> resp 5/13>> bcx2 5/13>>> u strep 5/13>> u legionella 5/13>>>   Antimicrobials:  Cefepime 5/13>>> vanc /13>>   Interim history/subjective:  Now sedated. Intermittently agitated  Objective   Blood pressure (Abnormal) 126/91, pulse  (Abnormal) 138, temperature (Abnormal) 101 F (38.3 C), temperature source Rectal, resp. rate (Abnormal) 40, height 5\' 4"  (1.626 m), weight 51 kg, SpO2 100 %.    Vent Mode: PRVC FiO2 (%):  [50 %-100 %] 100 % Set Rate:  [10 bmp-20 bmp] 20 bmp Vt Set:  [430 mL] 430 mL PEEP:  [5 cmH20] 5 cmH20 Plateau Pressure:  [9 cmH20] 9 cmH20  No intake or output data in the 24 hours ending 07/06/19 1242 Filed Weights   07/06/19 1130  Weight: 51 kg    Examination: General: sedated on vent HENT: perrl, ncat, no jvd, orally intubated Lungs: diffuse and coarse rhonchi w/ tactile frem Cardiovascular: tachycardic rrr w.out mrg Abdomen: soft not tender no om  Extremities: warm and dry pulses strong Neuro: sedated  GU: foley being placed  Resolved Hospital Problem list     Assessment & Plan:  Acute hypoxic respiratory failure in setting of multifocal PNA pcxr personally reviewed ett good position. Bilateral and multifocal airspace disease Plan ful vent support Pad protocol vap bundle Send resp viral studies, resp culture urine antigens Empiric vanc and cefepime Am cxr and abg   Severe sepsis w/ lactic acidosis  Plan Ensure she receives 30 mL/kg predicted body weight Maintenance IV fluids Repeat lactic acid Hold antihypertensives ICU admission Telemetry monitoring Check cortisol Antibiotics as above  Acute metabolic encephalopathy in the setting of sepsis/septic  Plan Supportive care Cont home zyprexa  PAD protocol RASS goal; -2 Precedex and prn fent Holding adderall   Elevated LFT: Has history of liver metastasis Plan We will continue to monitor  Hypokalemia Plan Replace and recheck  Stage IV adenocarcinoma w/ mets to brain, spine and liver  Plan F/u at  [facility]   Macrocytic anemia  Plan Trend cbc Transfuse for symptomatic anemia or hemoglobin less than 7  Mild coagulopathy: sepsis vs underlying liver mets Plan Trend INR   Remote history of pulmonary emboli.  Off  anticoagulation as directed by oncology at [facility] Currently I think her hypoxic respiratory failure is well explained by her chest x-ray, and presenting story more consistent with an infection then thromboembolic event Plan Prophylactic low molecular weight heparin Defer CT imaging for now  History of seizures.  Felt secondary to her brain metastasis Plan EEG, as recent question about recurrence Continue Keppra MRI post extubation Best practice:  Diet: Tube feeds Pain/Anxiety/Delirium protocol (if indicated): 5/13 VAP protocol (if indicated): 5/13 DVT prophylaxis: Low molecular weight heparin GI prophylaxis: PPI Glucose control: SSI Mobility: Bedrest Code Status: Full code Family Communication: Son and daughter updated at bedside Disposition: Admit to intensive care Labs   CBC: Recent Labs  Lab 07/06/19 1102  WBC 12.0*  NEUTROABS 11.3*  HGB 12.1  HCT 36.8  MCV 110.8*  PLT 135*    Basic Metabolic Panel: Recent Labs  Lab 07/06/19 1102  NA 131*  K 3.1*  CL 92*  CO2 20*  GLUCOSE 71  BUN 19  CREATININE 0.75  CALCIUM 8.1*   GFR: Estimated Creatinine Clearance: 64.7 mL/min (by C-G formula based on SCr of 0.75 mg/dL). Recent Labs  Lab 07/06/19 1102  WBC 12.0*  LATICACIDVEN 2.0*    Liver Function Tests: Recent Labs  Lab 07/06/19 1102  AST 52*  ALT 25  ALKPHOS 210*  BILITOT 1.1  PROT 7.2  ALBUMIN 2.7*   No results for input(s): LIPASE, AMYLASE in the last 168 hours. No results for input(s): AMMONIA in the last 168 hours.  ABG    Component Value Date/Time   PHART 7.418 07/06/2019 1119   PCO2ART 31.1 (L) 07/06/2019 1119   PO2ART 84.7 07/06/2019 1119   HCO3 19.7 (L) 07/06/2019 1119   ACIDBASEDEF 3.5 (H) 07/06/2019 1119   O2SAT 94.0 07/06/2019 1119     Coagulation Profile: Recent Labs  Lab 07/06/19 1102  INR 1.5*    Cardiac Enzymes: No results for input(s): CKTOTAL, CKMB, CKMBINDEX, TROPONINI in the last 168 hours.  HbA1C: No results found  for: HGBA1C  CBG: No results for input(s): GLUCAP in the last 168 hours.  Review of Systems:   Not able  Past Medical History  She,  has a past medical history of Anxiety, Asthma, Cancer ([place]), COPD (chronic obstructive pulmonary disease) ([place]), Depression, GERD (gastroesophageal reflux disease), Hypertension, and Migraine.   Surgical History    Past Surgical History:  Procedure Laterality Date  . ABDOMINAL HYSTERECTOMY    . CESAREAN SECTION    . ORIF right hip fracture       Social History   reports that she has quit smoking. Her smoking use included cigarettes. She has a 8.75 pack-year smoking history. She has never used smokeless tobacco. She reports that she does not drink alcohol or use drugs.   Family History   Her family history includes Breast cancer in her mother.   Allergies Allergies  Allergen Reactions  . Codeine Hives  . Shellfish Allergy Hives     Home Medications  Prior to Admission medications   Medication Sig Start Date End Date Taking? Authorizing Provider  ADDERALL XR 30 MG 24 hr capsule Take 30 mg by mouth daily.    [provider]  albuterol (PROVENTIL HFA;VENTOLIN HFA) 108 (90 Base) MCG/ACT inhaler Inhale 1-2 puffs  into the lungs every 6 (six) hours as needed for wheezing or shortness of breath.    [provider]  albuterol (PROVENTIL) (2.5 MG/3ML) 0.083% nebulizer solution Take 2.5 mg by nebulization every 6 (six) hours as needed for wheezing or shortness of breath.     [provider]  amphetamine-dextroamphetamine (ADDERALL) 10 MG tablet Take 10 mg by mouth 2 (two) times daily with a meal.     [provider]  cetirizine (ZYRTEC) 10 MG tablet Take 10 mg by mouth daily. 05/02/19   [provider]  dexamethasone (DECADRON) 2 MG tablet  06/09/19   [provider]  diphenhydrAMINE (BENADRYL) 25 MG tablet Take 25 mg by mouth at bedtime as needed for sleep.    [provider]  esomeprazole  (NEXIUM) 40 MG capsule Take 40 mg by mouth daily. 12/02/17   [provider]  fluconazole (DIFLUCAN) 200 MG tablet Take 200 mg by mouth daily. 14 day supply 06/12/19   [provider]  furosemide (LASIX) 20 MG tablet Take 20 mg by mouth daily as needed for fluid.     [provider]  ketorolac (TORADOL) 10 MG tablet Take 10 mg by mouth every 6 (six) hours as needed for pain. 06/02/19   [provider]  levETIRAcetam (KEPPRA) 500 MG tablet Take 1 tablet (500 mg total) by mouth 2 (two) times daily. 01/28/19 02/27/19  Dahal, Marlowe Aschoff, MD  lidocaine (LIDODERM) 5 % Place 1-2 patches onto the skin daily as needed (for back pain).     [provider]  lidocaine (XYLOCAINE) 2 % solution Use as directed 15 mLs in the mouth or throat every 6 (six) hours as needed for mouth pain.  11/24/18   [provider]  LORazepam (ATIVAN) 0.5 MG tablet Take 0.5 mg by mouth daily as needed for anxiety. 06/29/19   [provider]  Magnesium 200 MG TABS Take 1 tablet (200 mg total) by mouth daily. 01/26/19   Terrilee Croak, MD  metoprolol succinate (TOPROL-XL) 25 MG 24 hr tablet Take 25 mg by mouth daily.    [provider]  nystatin (MYCOSTATIN) 100000 UNIT/ML suspension Take 5 mLs (500,000 Units total) by mouth 4 (four) times daily. 12/30/17   Tacy Learn, PA-C  OLANZapine (ZYPREXA) 5 MG tablet Take 5 mg by mouth 2 (two) times daily.    [provider]  ondansetron (ZOFRAN) 8 MG tablet Take 8 mg by mouth every 8 (eight) hours as needed for nausea or vomiting.  11/01/18   [provider]  oxycodone (ROXICODONE) 30 MG immediate release tablet  06/02/19   [provider]  OXYCONTIN 20 MG 12 hr tablet Take 20 mg by mouth every 8 (eight) hours. 12/20/17   [provider]  potassium chloride (MICRO-K) 10 MEQ CR capsule Take 10 mEq by mouth 2 (two) times daily.  01/06/19   [provider]  SUMAtriptan (IMITREX) 6 MG/0.5ML SOLN  injection Inject 6 mg into the skin every 2 (two) hours as needed for migraine.  11/30/18   [provider]  valACYclovir (VALTREX) 500 MG tablet Take 500 mg by mouth daily.     [provider]     Critical care time: 40 minutes     Erick Colace ACNP-BC [facility] Pager # [phone] OR # [phone] if no answer

## 2019-07-06 NOTE — ED Triage Notes (Signed)
Per EMS: Patient is coming from home with complaint of sepsis, AMS. Patient is a cancer patient with bone and lung cancer. Patient has reportedly been actively declining the last few days and now cannot walk. Patient is alert to self and DOB only.

## 2019-07-06 NOTE — ED Notes (Signed)
MD goldston with glidoscope to attempt intubation.  Levada Dy, RT assisting.

## 2019-07-06 NOTE — Progress Notes (Signed)
Pharmacy Antibiotic Note  Ashley Mayo is a 54 y.o. female with a h/o adenocarcinoma of the lung with mets admitted on 07/06/2019 with pneumonia.  Pharmacy has been consulted for vancomycin and cefepime dosing.  Plan: Vancomycin 1000 mg IV once given in ED followed by vancomycin 750 mg iv q 12 hours.   Cefepime 2 g iv q 8 hours  Will f/u renal function and culture results.   Height: 5\' 4"  (162.6 cm) Weight: 51 kg (112 lb 8 oz) IBW/kg (Calculated) : 54.7  Temp (24hrs), Avg:99.5 F (37.5 C), Min:98.9 F (37.2 C), Max:101 F (38.3 C)  Recent Labs  Lab 07/06/19 1102  WBC 12.0*  CREATININE 0.75  LATICACIDVEN 2.0*    Estimated Creatinine Clearance: 64.7 mL/min (by C-G formula based on SCr of 0.75 mg/dL).    Allergies  Allergen Reactions  . Codeine Hives  . Shellfish Allergy Hives    Antimicrobials this admission: 5/13 cefepime >>  5/13 vancomycin >>  5/13 Flagyl x 1  Dose adjustments this admission:  Microbiology results: 5/13 BCx: sent 5/13 UCx: sent  Sputum:  5/13 MRSA PCR:   Thank you for allowing pharmacy to be a part of this patient's care.  Ulice Dash D 07/06/2019 3:11 PM

## 2019-07-06 NOTE — Procedures (Signed)
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   Level of alertness:  Comatose  AEDs during EEG study: keppra, versed  Technical aspects: This EEG study was done with scalp electrodes positioned according to the 10-20 International system of electrode placement. Electrical activity was acquired at a sampling rate of 500Hz  and reviewed with a high frequency filter of 70Hz  and a low frequency filter of 1Hz . EEG data were recorded continuously and digitally stored.   Description:  EEG showed continuous generalized polymorphic 3 to 6 Hz theta-delta slowing. Sharp transients were also seen in left parieto-occipital region which at times appear rhythmic.  Hyperventilation and photic stimulation were not performed.     ABNORMALITY -Continuous slow, generalized  IMPRESSION: This study is suggestive of severe diffuse encephalopathy, nonspecific etiology. Sharp transients were seen in left parieto-occipital region, at times rhythmic with low likelihood of potential epileptogenicity. If suspicion for interictal activity remains a concern, a prolonged study should be considered.   Ordering provider was notified.    Chistine Dematteo Barbra Sarks

## 2019-07-06 NOTE — ED Provider Notes (Signed)
[facility] DEPT Provider Note   CSN: [id number] Arrival date & time: 07/06/19  1037  LEVEL 5 CAVEAT - ALTERED MENTAL STATUS  History Chief Complaint  Patient presents with  . Blood Infection    Ashley Mayo is a 54 y.o. female.  HPI 54 year old female presents with respiratory distress.  History is limited as the patient is confused and in distress.  History mostly taken from daughter.  EMS was called by son because she has had essentially nothing to eat and drink over the last day or so and has had diarrhea and change in mental status.  She has terminal cancer which originates in the lung.  Arrives on a nonrebreather as she was found to be hypoxic on room air.  Patient answer some questions briefly but seems confused otherwise.   Past Medical History:  Diagnosis Date  . Anxiety   . Asthma   . Cancer ([place])    lung cancer  . COPD (chronic obstructive pulmonary disease) ([place])   . Depression   . GERD (gastroesophageal reflux disease)   . Hypertension   . Migraine     Patient Active Problem List   Diagnosis Date Noted  . ADHD 01/27/2019  . History of pulmonary embolism 01/27/2019  . 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 Hypomagnesemia 01/25/2019  . Symptomatic anemia   . Thrombocytopenia ([place])   . Leukopenia   . Perforated ear drum, bilateral 01/25/2016  . Pneumonia 01/23/2016  . Healthcare-associated pneumonia 01/23/2016  . Hypokalemia 01/23/2016  . Anxiety 01/23/2016  . Depression 01/23/2016  . Metastatic lung cancer (metastasis from lung to other site) ([place]) 01/23/2016  . Brain metastasis ([place]) 01/23/2016  . Essential hypertension 01/23/2016    Past Surgical History:  Procedure Laterality Date  . ABDOMINAL HYSTERECTOMY    . CESAREAN SECTION    . ORIF right hip fracture       OB History   No obstetric history on file.     Family History  Problem Relation Age of Onset  . Breast cancer Mother     Social History    Tobacco Use  . Smoking status: Former Smoker    Packs/day: 0.25    Years: 35.00    Pack years: 8.75    Types: Cigarettes  . Smokeless tobacco: Never Used  Substance Use Topics  . Alcohol use: No  . Drug use: No    Home Medications Prior to Admission medications   Medication Sig Start Date End Date Taking? Authorizing Provider  ADDERALL XR 30 MG 24 hr capsule Take 30 mg by mouth daily.    [provider]  albuterol (PROVENTIL HFA;VENTOLIN HFA) 108 (90 Base) MCG/ACT inhaler Inhale 1-2 puffs into the lungs every 6 (six) hours as needed for wheezing or shortness of breath.    [provider]  albuterol (PROVENTIL) (2.5 MG/3ML) 0.083% nebulizer solution Take 2.5 mg by nebulization every 6 (six) hours as needed for wheezing or shortness of breath.     [provider]  amphetamine-dextroamphetamine (ADDERALL) 10 MG tablet Take 10 mg by mouth 2 (two) times daily with a meal.     [provider]  cetirizine (ZYRTEC) 10 MG tablet Take 10 mg by mouth daily. 05/02/19   [provider]  dexamethasone (DECADRON) 2 MG tablet  06/09/19   [provider]  diphenhydrAMINE (BENADRYL) 25 MG tablet Take 25 mg by mouth at bedtime as needed for sleep.    [provider]  esomeprazole (NEXIUM) 40 MG  capsule Take 40 mg by mouth daily. 12/02/17   [provider]  fluconazole (DIFLUCAN) 200 MG tablet Take 200 mg by mouth daily. 14 day supply 06/12/19   [provider]  furosemide (LASIX) 20 MG tablet Take 20 mg by mouth daily as needed for fluid.     [provider]  ketorolac (TORADOL) 10 MG tablet Take 10 mg by mouth every 6 (six) hours as needed for pain. 06/02/19   [provider]  levETIRAcetam (KEPPRA) 500 MG tablet Take 1 tablet (500 mg total) by mouth 2 (two) times daily. 01/28/19 02/27/19  Dahal, Marlowe Aschoff, MD  lidocaine (LIDODERM) 5 % Place 1-2 patches onto the skin daily as needed (for back pain).     [provider]  lidocaine (XYLOCAINE) 2 % solution Use as directed 15 mLs in the mouth or throat every 6 (six) hours as needed for mouth pain.  11/24/18   [provider]  LORazepam (ATIVAN) 0.5 MG tablet Take 0.5 mg by mouth daily as needed for anxiety. 06/29/19   [provider]  Magnesium 200 MG TABS Take 1 tablet (200 mg total) by mouth daily. 01/26/19   Terrilee Croak, MD  metoprolol succinate (TOPROL-XL) 25 MG 24 hr tablet Take 25 mg by mouth daily.    [provider]  nystatin (MYCOSTATIN) 100000 UNIT/ML suspension Take 5 mLs (500,000 Units total) by mouth 4 (four) times daily. 12/30/17   Tacy Learn, PA-C  OLANZapine (ZYPREXA) 5 MG tablet Take 5 mg by mouth 2 (two) times daily.    [provider]  ondansetron (ZOFRAN) 8 MG tablet Take 8 mg by mouth every 8 (eight) hours as needed for nausea or vomiting.  11/01/18   [provider]  oxycodone (ROXICODONE) 30 MG immediate release tablet  06/02/19   [provider]  OXYCONTIN 20 MG 12 hr tablet Take 20 mg by mouth every 8 (eight) hours. 12/20/17   [provider]  potassium chloride (MICRO-K) 10 MEQ CR capsule Take 10 mEq by mouth 2 (two) times daily.  01/06/19   [provider]  SUMAtriptan (IMITREX) 6 MG/0.5ML SOLN injection Inject 6 mg into the skin every 2 (two) hours as needed for migraine.  11/30/18   [provider]  valACYclovir (VALTREX) 500 MG tablet Take 500 mg by mouth daily.     [provider]    Allergies    Codeine and Shellfish allergy  Review of Systems   Review of Systems  Physical Exam Updated Vital Signs BP (!) 126/91   Pulse (!) 138   Temp (!) 101 F (38.3 C) (Rectal)   Resp (!) 40   Ht 5\' 4"  (1.626 m)   Wt 51 kg   SpO2 100%   BMI 19.31 kg/m   Physical Exam Vitals and nursing note reviewed.  Constitutional:      General: She is in acute distress.     Appearance: She is well-developed. She is ill-appearing.  HENT:      Head: Normocephalic and atraumatic.     Right Ear: External ear normal.     Left Ear: External ear normal.     Nose: Nose normal.  Eyes:     General:        Right eye: No discharge.        Left eye: No discharge.  Cardiovascular:     Rate and Rhythm: Regular rhythm. Tachycardia present.     Pulses:  Radial pulses are 1+ on the right side and 1+ on the left side.     Heart sounds: Normal heart sounds.  Pulmonary:     Effort: Tachypnea, accessory muscle usage and respiratory distress present.     Breath sounds: Rales present.  Abdominal:     Palpations: Abdomen is soft.     Tenderness: There is no abdominal tenderness.  Skin:    Coloration: Skin is mottled (bilateral legs).  Neurological:     Mental Status: She is alert. She is disoriented.  Psychiatric:        Mood and Affect: Mood is not anxious.     ED Results / Procedures / Treatments   Labs (all labs ordered are listed, but only abnormal results are displayed) Labs Reviewed  LACTIC ACID, PLASMA - Abnormal; Notable for the following components:      Result Value   Lactic Acid, Venous 2.0 (*)    All other components within normal limits  COMPREHENSIVE METABOLIC PANEL - Abnormal; Notable for the following components:   Sodium 131 (*)    Potassium 3.1 (*)    Chloride 92 (*)    CO2 20 (*)    Calcium 8.1 (*)    Albumin 2.7 (*)    AST 52 (*)    Alkaline Phosphatase 210 (*)    Anion gap 19 (*)    All other components within normal limits  CBC WITH DIFFERENTIAL/PLATELET - Abnormal; Notable for the following components:   WBC 12.0 (*)    RBC 3.32 (*)    MCV 110.8 (*)    MCH 36.4 (*)    RDW 17.2 (*)    Platelets 135 (*)    nRBC 0.5 (*)    Neutro Abs 11.3 (*)    Lymphs Abs 0.3 (*)    All other components within normal limits  APTT - Abnormal; Notable for the following components:   aPTT 53 (*)    All other components within normal limits  PROTIME-INR - Abnormal; Notable for the following components:    Prothrombin Time 17.9 (*)    INR 1.5 (*)    All other components within normal limits  BLOOD GAS, ARTERIAL - Abnormal; Notable for the following components:   pCO2 arterial 31.1 (*)    Bicarbonate 19.7 (*)    Acid-base deficit 3.5 (*)    All other components within normal limits  CULTURE, BLOOD (ROUTINE X 2)  CULTURE, BLOOD (ROUTINE X 2)  URINE CULTURE  SARS CORONAVIRUS 2 BY RT PCR (HOSPITAL ORDER, [facility])  LACTIC ACID, PLASMA  URINALYSIS, ROUTINE W REFLEX MICROSCOPIC  HCG, QUANTITATIVE, PREGNANCY  BLOOD GAS, ARTERIAL  POC SARS CORONAVIRUS 2 AG -  ED    EKG EKG Interpretation  Date/Time:  Thursday Jul 06 2019 11:21:14 EDT Ventricular Rate:  146 PR Interval:    QRS Duration: 86 QT Interval:  295 QTC Calculation: 460 R Axis:   41 Text Interpretation: Sinus tachycardia Ventricular premature complex Aberrant complex LAE, consider biatrial enlargement Anteroseptal infarct, old Nonspecific T abnormalities, lateral leads rate is faster compared to 2020 Confirmed by Sherwood Gambler [phone]) on 07/06/2019 1:29:29 PM   Radiology DG Abdomen 1 View  Result Date: 07/06/2019 CLINICAL DATA:  Check gastric catheter placement EXAM: ABDOMEN - 1 VIEW COMPARISON:  None. FINDINGS: Gastric catheter is noted within the mid stomach. Stable bilateral pulmonary infiltrates are seen. Scattered large and small bowel gas is noted. IMPRESSION: Gastric catheter within the stomach. Electronically Signed  By: Inez Catalina M.D.   On: 07/06/2019 15:01   DG Chest Portable 1 View  Result Date: 07/06/2019 CLINICAL DATA:  Endotracheal placement EXAM: PORTABLE CHEST 1 VIEW COMPARISON:  07/06/2019 FINDINGS: Endotracheal tube tip is 3.5 cm above the carina. Widespread bilateral pulmonary infiltrates as seen previously. IMPRESSION: Endotracheal tube tip 3.5 cm above the carina. Electronically Signed   By: Nelson Chimes M.D.   On: 07/06/2019 13:05   DG Chest Port 1 View  Result Date:  07/06/2019 CLINICAL DATA:  Sepsis.  Hypoxia. EXAM: PORTABLE CHEST 1 VIEW COMPARISON:  Chest x-ray dated January 27, 2019. FINDINGS: The heart size and mediastinal contours are within normal limits. Normal pulmonary vascularity. The lungs remain hyperinflated with emphysematous changes and scattered interstitial coarsening. New patchy consolidation in the lingula, right middle lobe, and right upper lobe. No pleural effusion or pneumothorax. No acute osseous abnormality. IMPRESSION: 1. New multifocal pneumonia. 2. COPD. Electronically Signed   By: Titus Dubin M.D.   On: 07/06/2019 11:51    Procedures .Critical Care Performed by: Sherwood Gambler, MD Authorized by: Sherwood Gambler, MD   Critical care provider statement:    Critical care time (minutes):  60   Critical care time was exclusive of:  Separately billable procedures and treating other patients   Critical care was necessary to treat or prevent imminent or life-threatening deterioration of the following conditions:  Respiratory failure and sepsis   Critical care was time spent personally by me on the following activities:  Discussions with consultants, evaluation of patient's response to treatment, examination of patient, ordering and performing treatments and interventions, ordering and review of laboratory studies, ordering and review of radiographic studies, pulse oximetry, re-evaluation of patient's condition, obtaining history from patient or surrogate and review of old charts Procedure Name: Intubation Date/Time: 07/06/2019 12:29 PM Performed by: Sherwood Gambler, MD Pre-anesthesia Checklist: Patient identified, Patient being monitored, Emergency Drugs available, Timeout performed and Suction available Oxygen Delivery Method: Non-rebreather mask Preoxygenation: Pre-oxygenation with 100% oxygen Induction Type: Rapid sequence Ventilation: Mask ventilation without difficulty Laryngoscope Size: Glidescope and 3 Grade View: Grade I Tube  size: 7.5 mm Number of attempts: 1 Airway Equipment and Method: Video-laryngoscopy Placement Confirmation: ETT inserted through vocal cords under direct vision,  CO2 detector and Breath sounds checked- equal and bilateral Secured at: 21 cm Tube secured with: ETT holder Dental Injury: Teeth and Oropharynx as per pre-operative assessment       (including critical care time)  Medications Ordered in ED Medications  metroNIDAZOLE (FLAGYL) IVPB 500 mg (500 mg Intravenous New Bag/Given 07/06/19 1139)  acetaminophen (TYLENOL) suppository 650 mg (has no administration in time range)  fentaNYL (SUBLIMAZE) injection 100 mcg (has no administration in time range)  fentaNYL (SUBLIMAZE) injection 100 mcg (100 mcg Intravenous Given 07/06/19 1228)  midazolam (VERSED) injection 2 mg (2 mg Intravenous Given 07/06/19 1228)  midazolam (VERSED) injection 2 mg (has no administration in time range)  lidocaine (cardiac) 100 mg/25mL (XYLOCAINE) 100 MG/5ML injection 2% (has no administration in time range)  propofol (DIPRIVAN) 500 MG/50ML infusion (has no administration in time range)  succinylcholine (ANECTINE) 200 MG/10ML syringe (has no administration in time range)  rocuronium bromide 100 MG/10ML SOSY (has no administration in time range)  etomidate (AMIDATE) 2 MG/ML injection (has no administration in time range)  ceFEPIme (MAXIPIME) 2 g in sodium chloride 0.9 % 100 mL IVPB (0 g Intravenous Stopped 07/06/19 1152)  vancomycin (VANCOCIN) IVPB 1000 mg/200 mL premix (1,000 mg Intravenous New Bag/Given 07/06/19 1127)  LORazepam (ATIVAN) injection 0.5 mg (0.5 mg Intravenous Given 07/06/19 1116)  sodium chloride 0.9 % bolus 1,000 mL (1,000 mLs Intravenous New Bag/Given 07/06/19 1128)  etomidate (AMIDATE) injection 15.3 mg (15.3 mg Intravenous Given 07/06/19 1223)  succinylcholine (ANECTINE) injection 76.6 mg (76.6 mg Intravenous Given 07/06/19 1224)    ED Course  I have reviewed the triage vital signs and the nursing  notes.  Pertinent labs & imaging results that were available during my care of the patient were reviewed by me and considered in my medical decision making (see chart for details).    MDM Rules/Calculators/A&P                      I had multiple discussions with the daughter who is at the bedside.  We discussed her overall status as well as her acute illness.  We discussed that this could be terminal.  She understands that overall this cancer is untreatable.  She has discussed with patient and patient wants everything done as she wants to see her grandchild born (daughter is currently pregnant).  We discussed that even this may not result in saving her from this acute illness.  She was placed on BiPAP shortly after arrival and while she did look a little better, she was still breathing in the 40s and still in distress.  Her oxygen was better though.  She was then intubated because of this work of breathing.  Requiring large amounts of fentanyl and Versed for sedation.  She was given broad antibiotics for sepsis shortly after arrival though these can probably be narrowed somewhat as this appears to be pneumonia as the cause. Final Clinical Impression(s) / ED Diagnoses Final diagnoses:  Multifocal pneumonia  Acute on chronic respiratory failure with hypoxia ([place])  Severe sepsis [facility])    Rx / DC Orders ED Discharge Orders    None       Sherwood Gambler, MD 07/06/19 [phone]

## 2019-07-06 NOTE — ED Notes (Addendum)
ED Provider at bedside. 

## 2019-07-06 NOTE — Sepsis Progress Note (Signed)
Notified bedside nurse of need to administer antibiotics.  

## 2019-07-06 NOTE — ED Notes (Signed)
Patient's daughter assisted to consult room to speak to MD in private

## 2019-07-06 NOTE — Progress Notes (Signed)
This RN called and tried to update patients son [name] who lives in [place] [phone]) with no answer. Will report off to day shift.

## 2019-07-06 NOTE — Progress Notes (Signed)
EEG complete - results pending 

## 2019-07-06 NOTE — ED Notes (Signed)
Intubation in Progress:

## 2019-07-06 NOTE — Progress Notes (Signed)
A consult was received from an ED physician for cefepime and vancomycin per pharmacy dosing.  The patient's profile has been reviewed for ht/wt/allergies/indication/available labs.   A one time order has been placed for vancomycin 1000 mg and cefepime 2 gm.  Further antibiotics/pharmacy consults should be ordered by admitting physician if indicated.                       Thank you, [name] Form 07/06/2019  12:27 PM

## 2019-07-06 NOTE — ED Notes (Signed)
ED Provider at bedside. 

## 2019-07-07 ENCOUNTER — Inpatient Hospital Stay (HOSPITAL_COMMUNITY): Payer: Medicaid Other

## 2019-07-07 DIAGNOSIS — J13 Pneumonia due to Streptococcus pneumoniae: Secondary | ICD-10-CM

## 2019-07-07 LAB — PHOSPHORUS
Phosphorus: 2 mg/dL — ABNORMAL LOW (ref 2.5–4.6)
Phosphorus: 3.1 mg/dL (ref 2.5–4.6)

## 2019-07-07 LAB — GLUCOSE, CAPILLARY
Glucose-Capillary: 100 mg/dL — ABNORMAL HIGH (ref 70–99)
Glucose-Capillary: 102 mg/dL — ABNORMAL HIGH (ref 70–99)
Glucose-Capillary: 116 mg/dL — ABNORMAL HIGH (ref 70–99)
Glucose-Capillary: 70 mg/dL (ref 70–99)
Glucose-Capillary: 91 mg/dL (ref 70–99)
Glucose-Capillary: 95 mg/dL (ref 70–99)

## 2019-07-07 LAB — RESPIRATORY PANEL BY PCR

## 2019-07-07 LAB — BLOOD GAS, ARTERIAL
Acid-base deficit: 6.1 mmol/L — ABNORMAL HIGH (ref 0.0–2.0)
Bicarbonate: 18.4 mmol/L — ABNORMAL LOW (ref 20.0–28.0)
FIO2: 40
O2 Saturation: 97.6 %
Patient temperature: 100
pCO2 arterial: 34.7 mmHg (ref 32.0–48.0)
pH, Arterial: 7.344 — ABNORMAL LOW (ref 7.350–7.450)
pO2, Arterial: 140 mmHg — ABNORMAL HIGH (ref 83.0–108.0)

## 2019-07-07 LAB — CBC
HCT: 25.1 % — ABNORMAL LOW (ref 36.0–46.0)
Hemoglobin: 8 g/dL — ABNORMAL LOW (ref 12.0–15.0)
MCH: 36.7 pg — ABNORMAL HIGH (ref 26.0–34.0)
MCHC: 31.9 g/dL (ref 30.0–36.0)
MCV: 115.1 fL — ABNORMAL HIGH (ref 80.0–100.0)
Platelets: 108 10*3/uL — ABNORMAL LOW (ref 150–400)
RBC: 2.18 MIL/uL — ABNORMAL LOW (ref 3.87–5.11)
RDW: 17.4 % — ABNORMAL HIGH (ref 11.5–15.5)
WBC: 8.2 10*3/uL (ref 4.0–10.5)
nRBC: 0.5 % — ABNORMAL HIGH (ref 0.0–0.2)

## 2019-07-07 LAB — MAGNESIUM
Magnesium: 1 mg/dL — ABNORMAL LOW (ref 1.7–2.4)
Magnesium: 2 mg/dL (ref 1.7–2.4)

## 2019-07-07 LAB — LEGIONELLA PNEUMOPHILA SEROGP 1 UR AG: L. pneumophila Serogp 1 Ur Ag: NEGATIVE

## 2019-07-07 LAB — BASIC METABOLIC PANEL
Anion gap: 8 (ref 5–15)
BUN: 15 mg/dL (ref 6–20)
CO2: 19 mmol/L — ABNORMAL LOW (ref 22–32)
Calcium: 7.2 mg/dL — ABNORMAL LOW (ref 8.9–10.3)
Chloride: 109 mmol/L (ref 98–111)
Creatinine, Ser: 0.6 mg/dL (ref 0.44–1.00)
GFR calc Af Amer: >60 mL/min (ref >60)
GFR calc non Af Amer: >60 mL/min (ref >60)
Glucose, Bld: 100 mg/dL — ABNORMAL HIGH (ref 70–99)
Potassium: 3.1 mmol/L — ABNORMAL LOW (ref 3.5–5.1)
Sodium: 136 mmol/L (ref 135–145)

## 2019-07-07 IMAGING — CT CT CHEST W/O CM
2 of 3 series · 15 of 36 positions shown, 18 images · non-contrast
Comparison: Chest radiograph from earlier today. [DATE] chest
CT angiogram.

CLINICAL DATA: Inpatient. Reported history of lung cancer and
pneumococcal pneumonia.

EXAM:
CT CHEST WITHOUT CONTRAST
TECHNIQUE: Multidetector CT imaging of the chest was performed following the
standard protocol without IV contrast.

[Series 2: thorax · axial · 0.57mm/px · z∈[-569,-339]mm · 12 of 137 slices shown, 15 images]
[im 11/137  mediastinal]
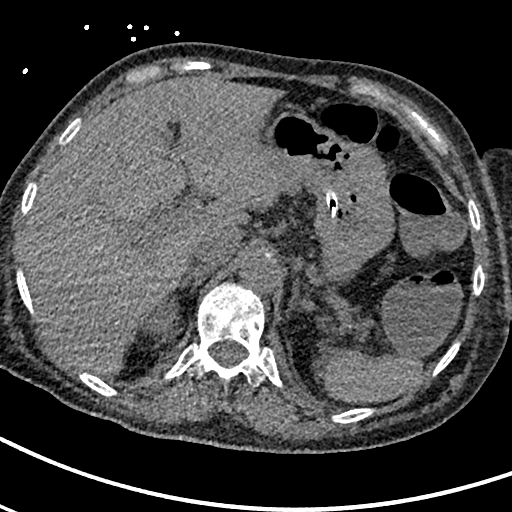
[im 11/137  lung]
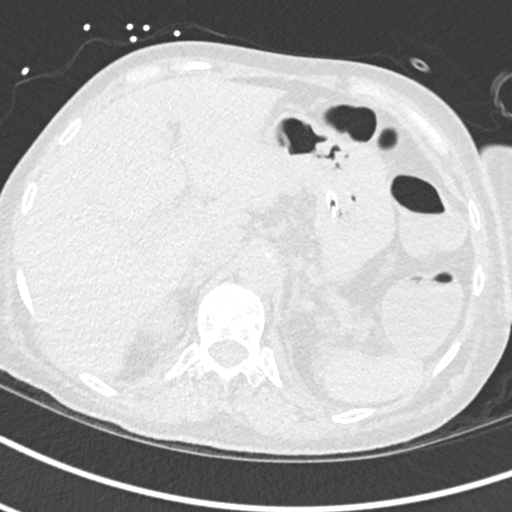
[im 21/137  lung]
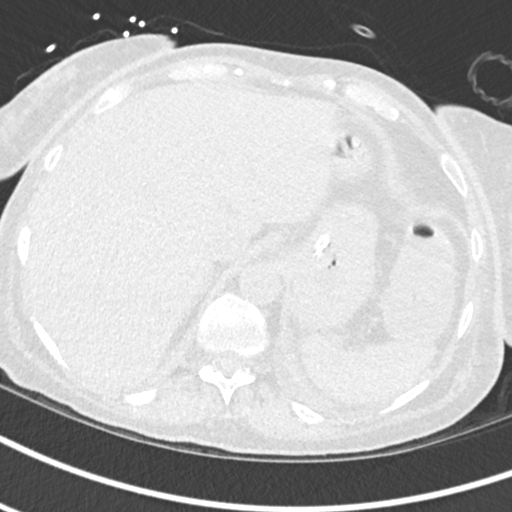
[im 31/137  lung]
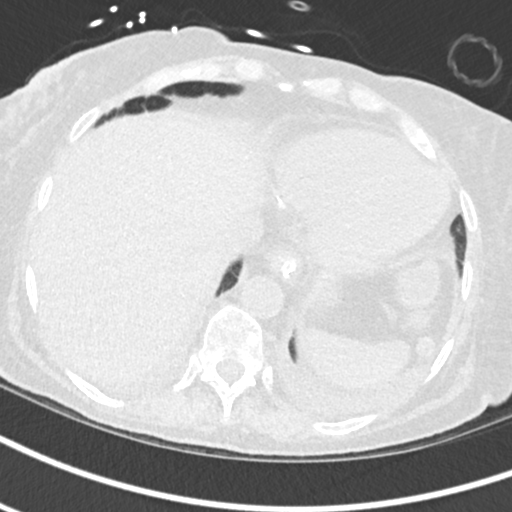
[im 41/137  lung]
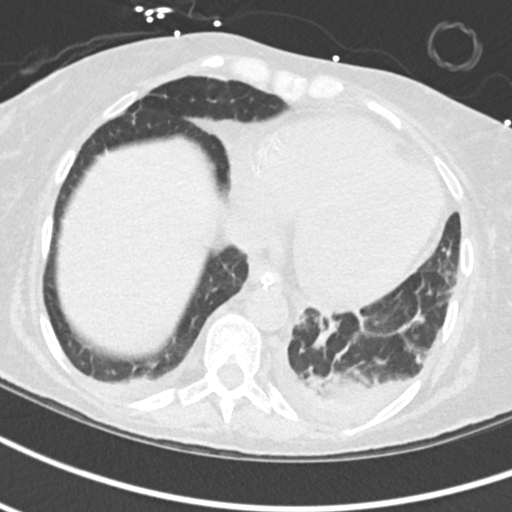
[im 51/137  mediastinal]
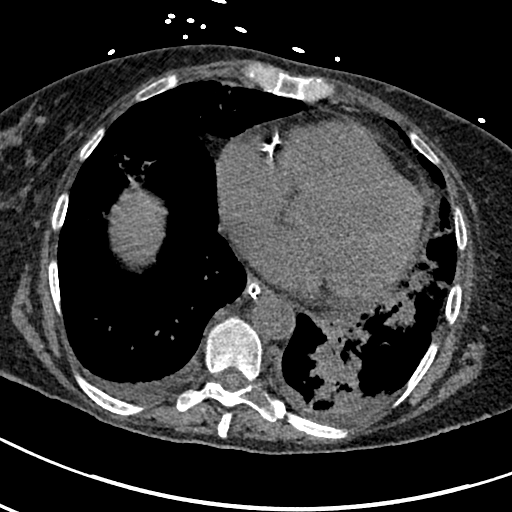
[im 51/137  lung]
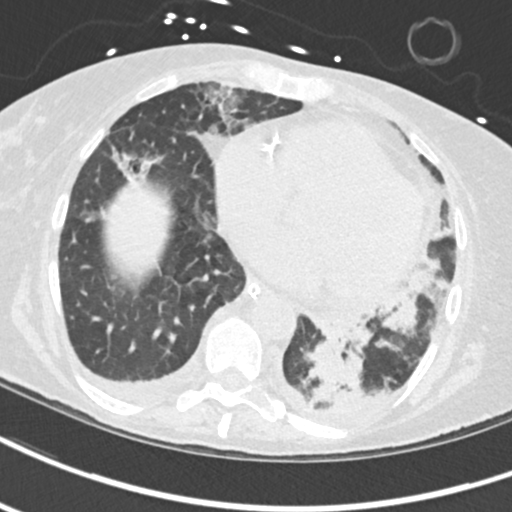
[im 61/137  lung]
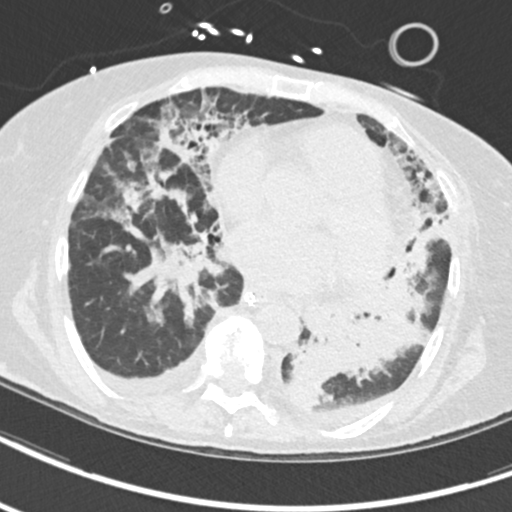
[im 76/137  lung]
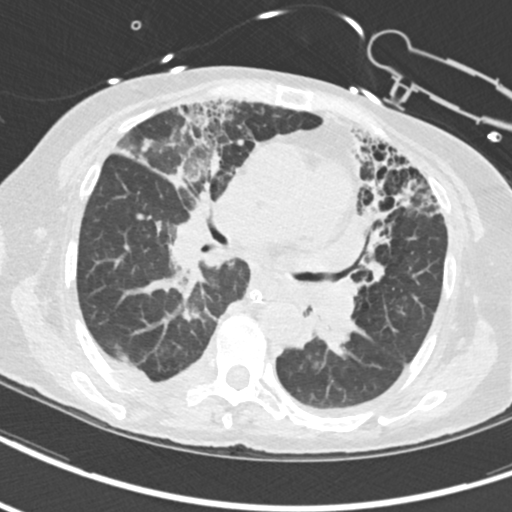
[im 86/137  lung]
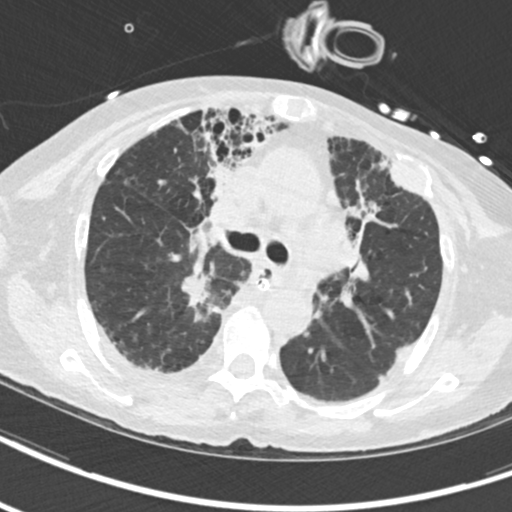
[im 96/137  mediastinal]
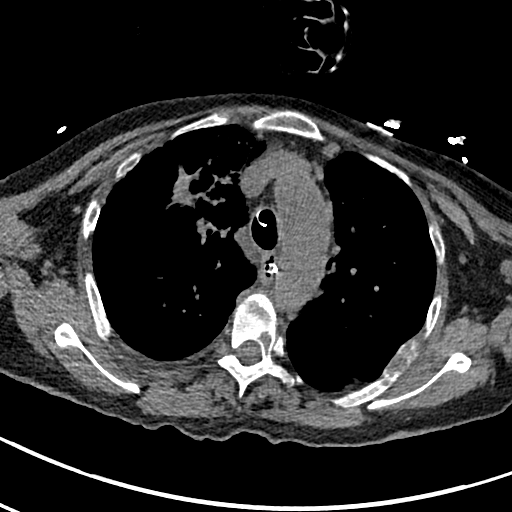
[im 96/137  lung]
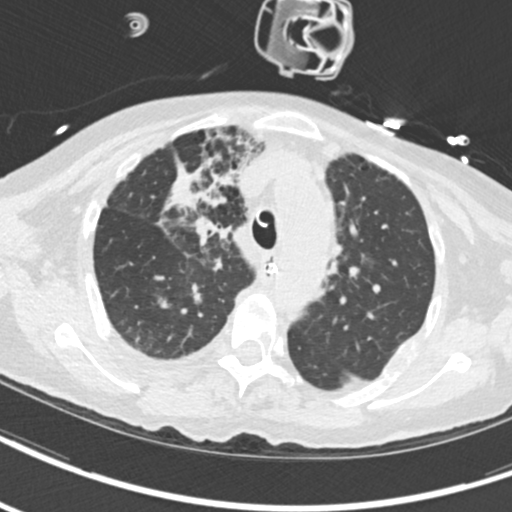
[im 106/137  lung]
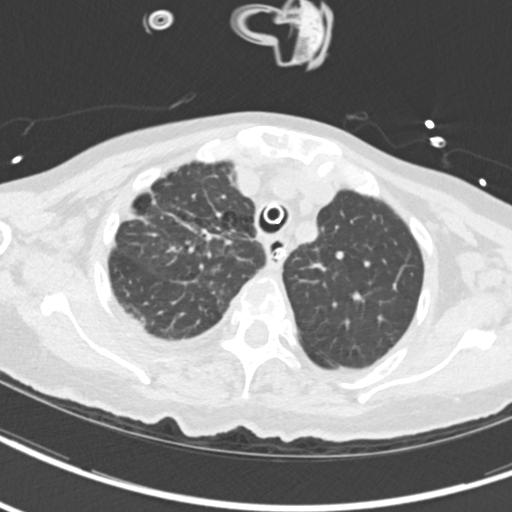
[im 116/137  lung]
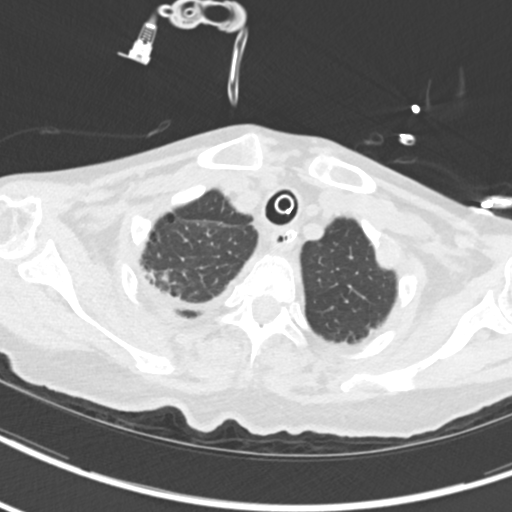
[im 126/137  lung]
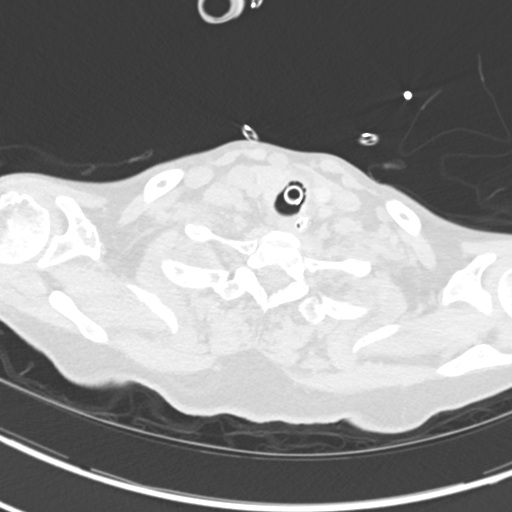

[Series 6: coronal · coronal · 0.56mm/px · 3 of 117 slices shown]
[im 24/117  lung]
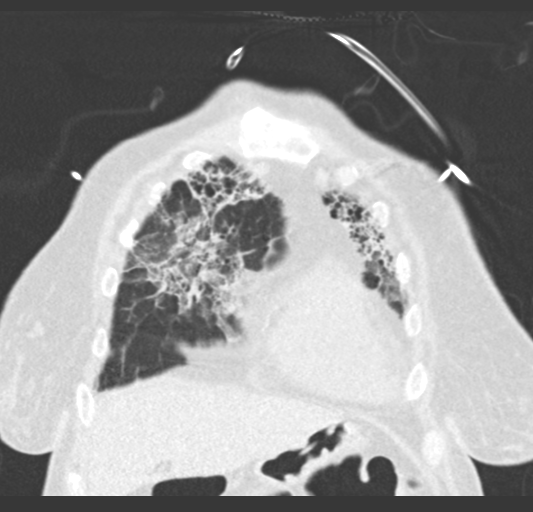
[im 47/117  lung]
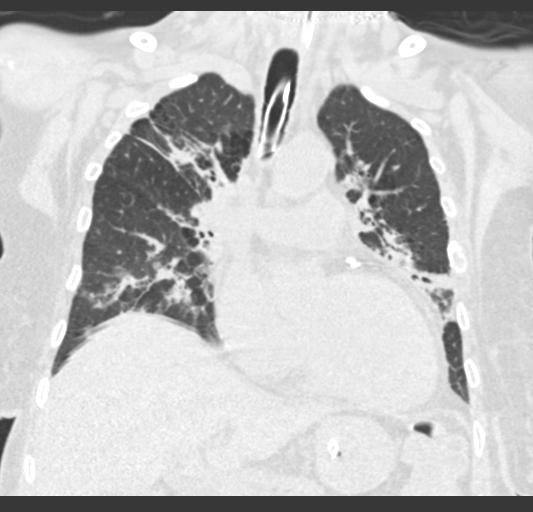
[im 70/117  lung]
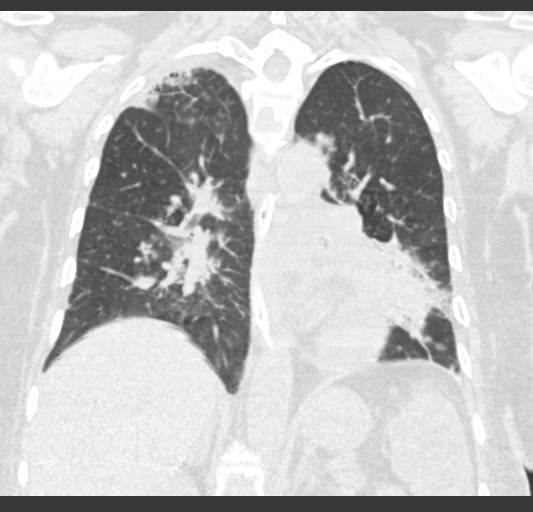

[15 of 36 positions shown; findings below may reference images not displayed]

FINDINGS: Cardiovascular: Mild cardiomegaly. No significant pericardial
effusion/thickening. Three-vessel coronary atherosclerosis. Normal
course and caliber of the thoracic aorta. Normal caliber pulmonary
arteries.

Mediastinum/Nodes: No discrete thyroid nodules. Enteric tube
terminates in the body of the stomach. No axillary or mediastinal
adenopathy. Suggestion of bilateral hilar adenopathy, poorly
delineated on this noncontrast scan.

Lungs/Pleura: No pneumothorax. Trace dependent bilateral pleural
effusions. Endotracheal tube tip is 1.7 cm above the carina. Left
lower lobe bronchus is occluded. Moderate centrilobular and
paraseptal emphysema. Extensive patchy consolidation and surrounding
ground-glass opacity throughout both lungs involving all lung lobes,
most prominent in the central left lower lobe, where the
consolidation appears somewhat irregular and masslike although
ill-defined. No significant discrete pulmonary nodules.

Upper abdomen: No acute abnormality.

Musculoskeletal: Widespread lytic osseous metastases throughout the
thoracic vertebral bodies, most prominent at T3, T4 and T8 with
moderate pathologic vertebral fracture at T8. Mixed lytic and
sclerotic manubrial metastasis, mildly expansile. Expansile lytic
anterior left first rib, anterior left third rib and lateral left
fifth rib metastases.
IMPRESSION: 1. Extensive patchy consolidation and surrounding ground-glass
opacity throughout both lungs involving all lung lobes, most
prominent in the central left lower lobe, where the consolidation
appears somewhat irregular and masslike although ill-defined.
Occluded left lower lobe bronchus. Findings are compatible with
multilobar pneumonia with suspected central left lower lobe
neoplasm.
2. Suggestion of bilateral hilar adenopathy, poorly delineated on
this noncontrast scan.
3. Widespread lytic osseous metastases throughout the thoracic
skeleton as detailed. Moderate pathologic vertebral fracture at T8.
4. Trace dependent bilateral pleural effusions.
5. Endotracheal tube tip is 1.7 cm above the carina. Enteric tube
terminates in the body of the stomach.
6. Three-vessel coronary atherosclerosis.
7. Aortic Atherosclerosis ([AV]-[AV]) and Emphysema ([AV]-[AV]).

## 2019-07-07 IMAGING — DX DG CHEST 1V PORT
2 series · 2 of 2 positions shown · non-contrast
Comparison: [DATE].

CLINICAL DATA: Adenocarcinoma of the lung.

EXAM:
PORTABLE CHEST 1 VIEW

[chest ap (1 of 2)]
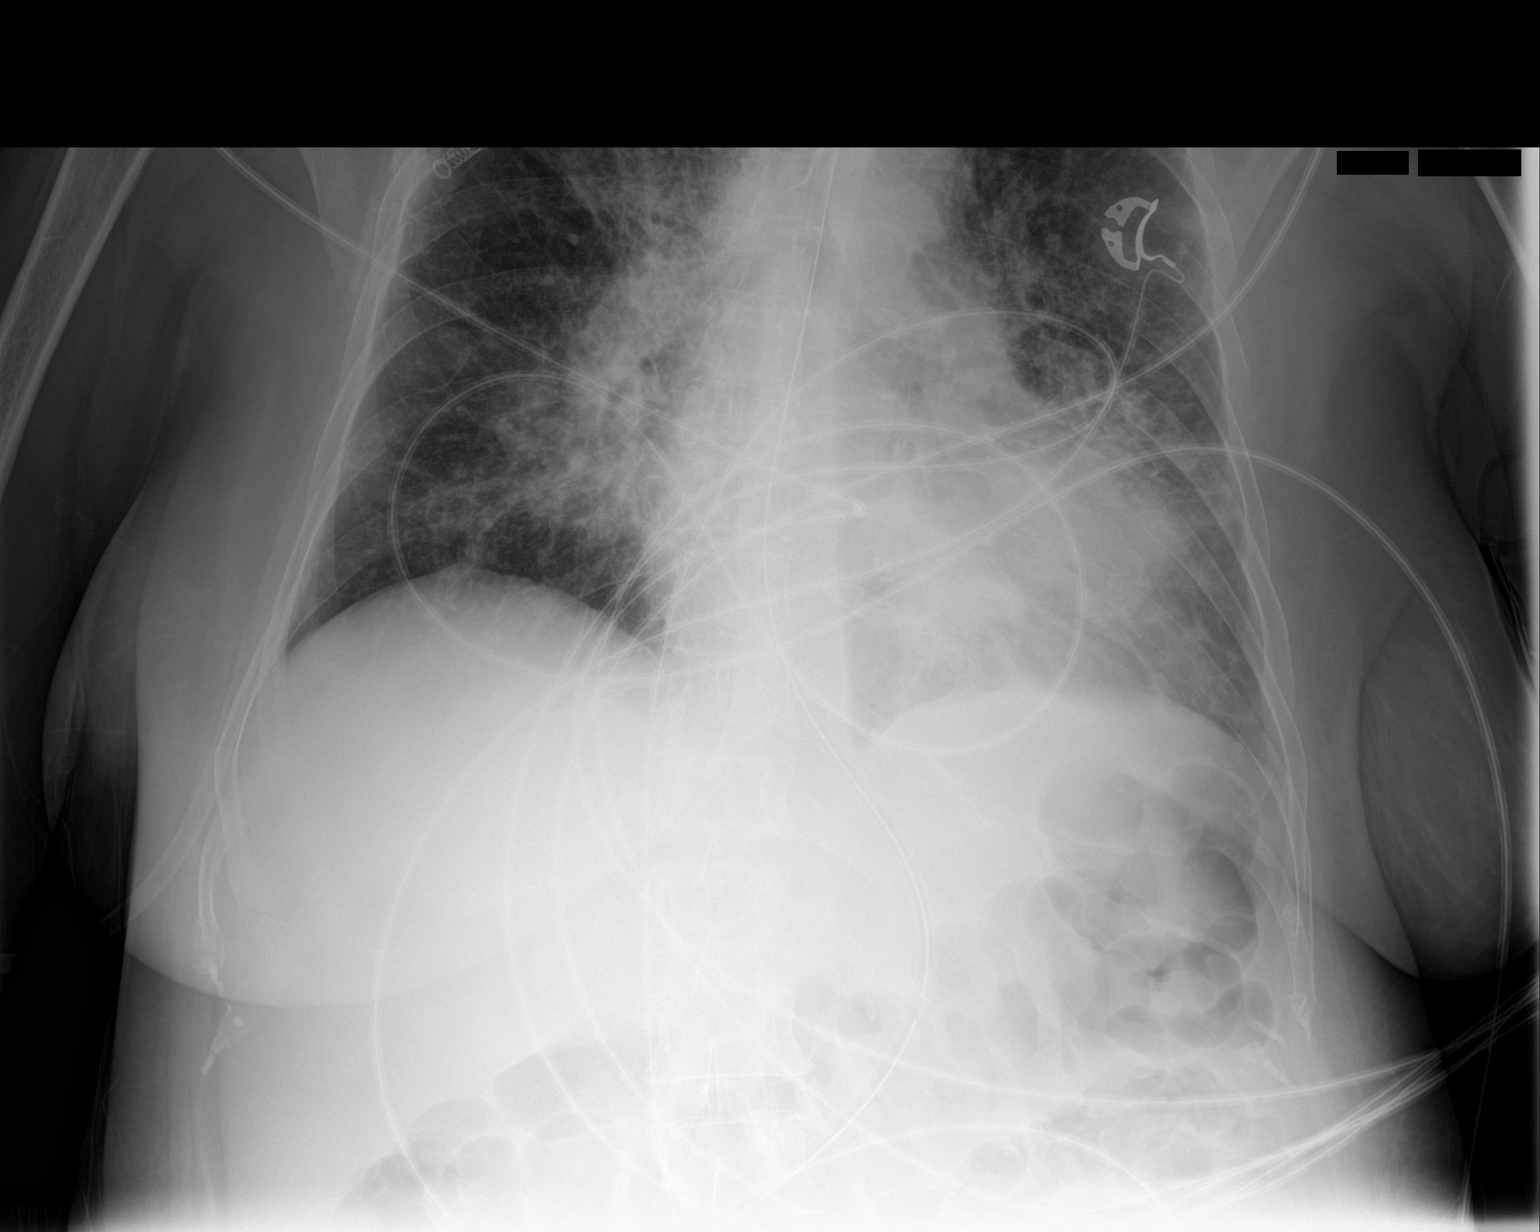

[chest ap (2 of 2)]
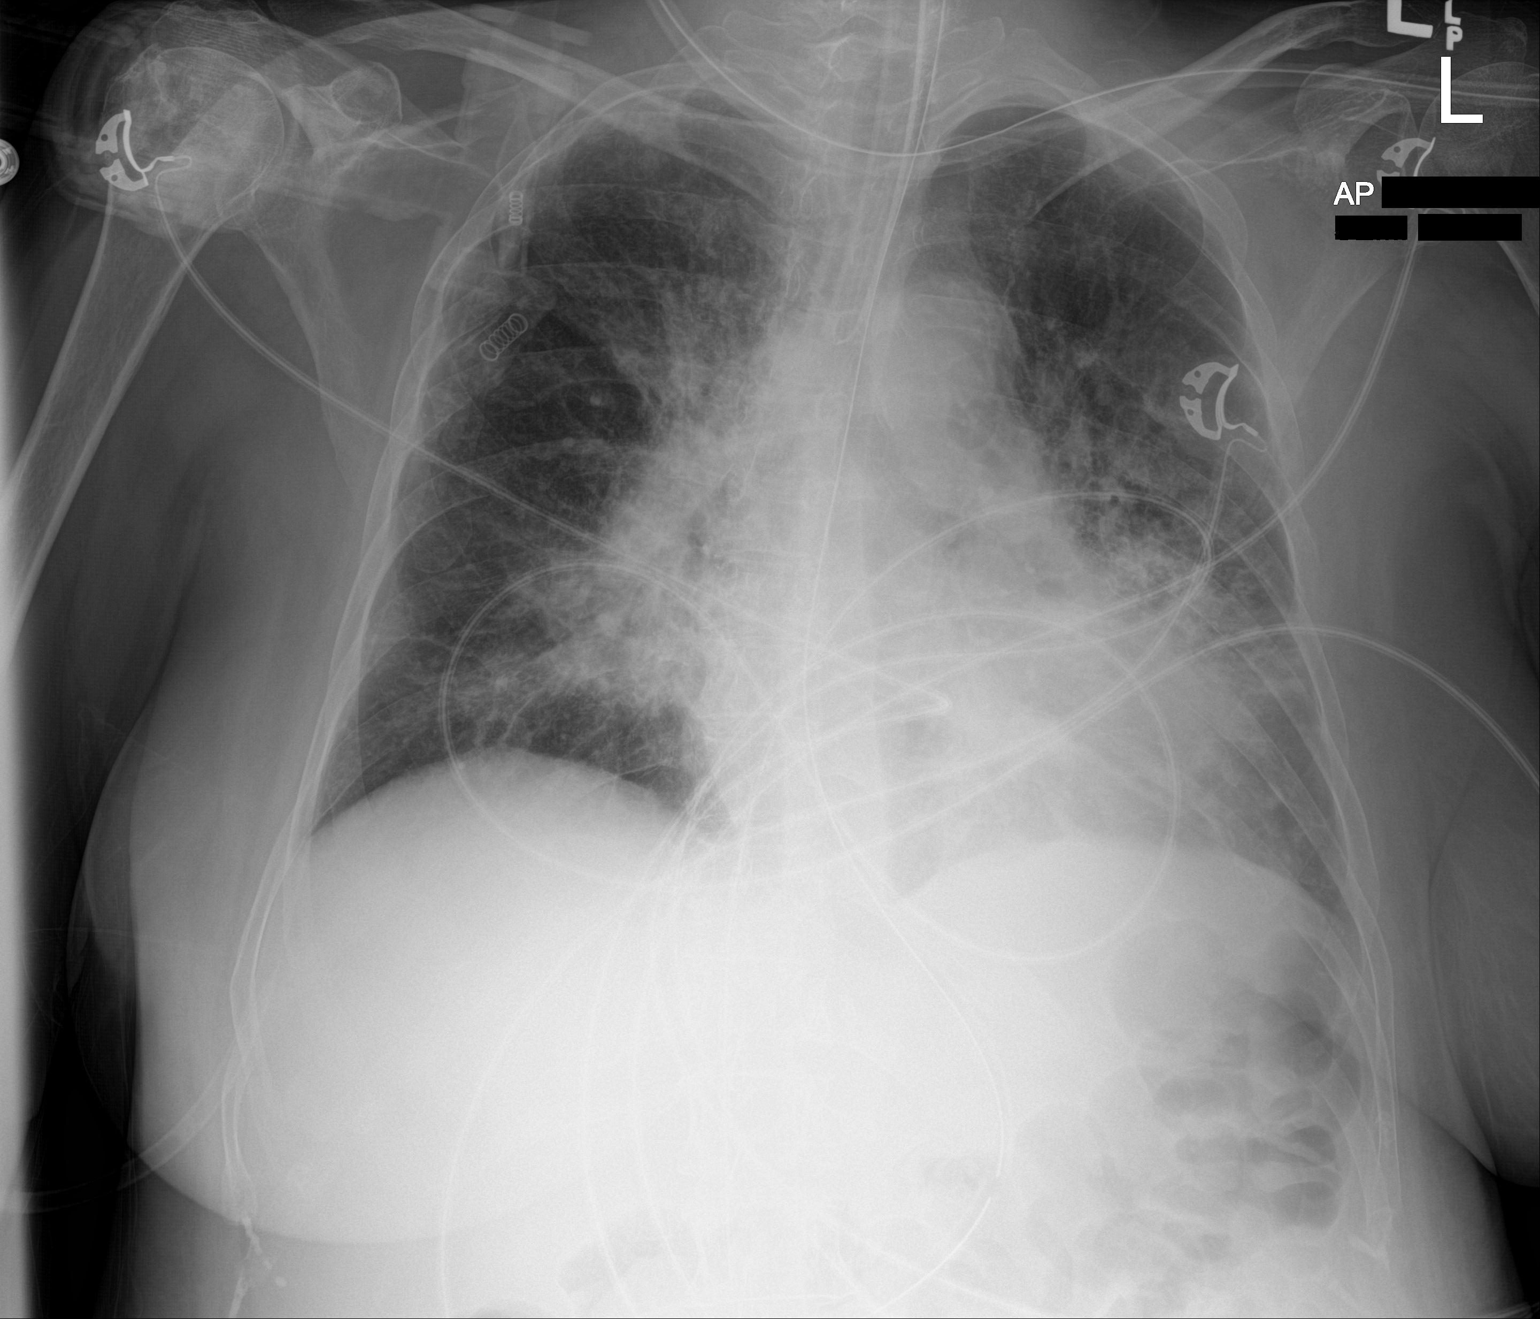

[2 of 2 positions shown; findings below may reference images not displayed]

FINDINGS: Stable cardiomediastinal silhouette. Endotracheal and nasogastric
tubes are in grossly good position. No pneumothorax or pleural
effusion is noted. Grossly stable bilateral lung opacities are noted
concerning for pneumonia. Bony thorax is unremarkable.
IMPRESSION: Stable bilateral lung opacities are noted concerning for pneumonia.
Endotracheal and nasogastric tubes are in grossly good position.

## 2019-07-07 MED ORDER — POTASSIUM PHOSPHATES 15 MMOLE/5ML IV SOLN
20.0000 mmol | Freq: Once | INTRAVENOUS | Status: AC
  Administered 2019-07-07: 20 mmol via INTRAVENOUS
  Filled 2019-07-07: qty 6.67

## 2019-07-07 MED ORDER — POTASSIUM CHLORIDE 20 MEQ/15ML (10%) PO SOLN
40.0000 meq | Freq: Once | ORAL | Status: AC
  Administered 2019-07-07: 40 meq
  Filled 2019-07-07: qty 30

## 2019-07-07 MED ORDER — FREE WATER
200.0000 mL | Status: DC
  Administered 2019-07-07 – 2019-07-09 (×13): 200 mL

## 2019-07-07 MED ORDER — MAGIC MOUTHWASH
15.0000 mL | Freq: Four times a day (QID) | ORAL | Status: DC
  Administered 2019-07-07 – 2019-07-11 (×9): 15 mL via ORAL
  Filled 2019-07-07 (×12): qty 15

## 2019-07-07 MED ORDER — VITAL HIGH PROTEIN PO LIQD: 1000.0000 mL | ORAL | Status: DC

## 2019-07-07 MED ORDER — VITAL AF 1.2 CAL PO LIQD
1000.0000 mL | ORAL | Status: DC
  Administered 2019-07-07 – 2019-07-09 (×2): 1000 mL

## 2019-07-07 MED ORDER — MAGNESIUM SULFATE 4 GM/100ML IV SOLN
4.0000 g | Freq: Once | INTRAVENOUS | Status: AC
  Administered 2019-07-07: 4 g via INTRAVENOUS
  Filled 2019-07-07: qty 100

## 2019-07-07 MED ORDER — SODIUM CHLORIDE 0.9 % IV SOLN
2.0000 g | INTRAVENOUS | Status: DC
  Administered 2019-07-07 – 2019-07-11 (×5): 2 g via INTRAVENOUS
  Filled 2019-07-07: qty 20
  Filled 2019-07-07: qty 2
  Filled 2019-07-07: qty 20
  Filled 2019-07-07: qty 2
  Filled 2019-07-07: qty 20

## 2019-07-07 MED ORDER — PRO-STAT SUGAR FREE PO LIQD: 30.0000 mL | Freq: Two times a day (BID) | ORAL | Status: DC

## 2019-07-07 MED ORDER — DEXTROSE IN LACTATED RINGERS 5 % IV SOLN: INTRAVENOUS | Status: DC

## 2019-07-07 MED ORDER — FUROSEMIDE 10 MG/ML IJ SOLN
40.0000 mg | Freq: Once | INTRAMUSCULAR | Status: AC
  Administered 2019-07-07: 40 mg via INTRAVENOUS
  Filled 2019-07-07: qty 4

## 2019-07-07 NOTE — Progress Notes (Signed)
[facility] @ 0330 -70. [facility]Link RN notified. Awaiting orders.

## 2019-07-07 NOTE — Progress Notes (Signed)
Pharmacy Antibiotic Note  Ashley Mayo is a 54 y.o. female with a h/o adenocarcinoma of the lung with mets admitted on 07/06/2019 with pneumonia.  Pharmacy has been consulted for vancomycin and cefepime dosing.  Plan: Abx adjusted to Ceftriaxone 2gm q24 MRSA PCR neg > d/c Vanc Strep pneumo urinary Ag positive > Cefepime to CTX  Height: 5\' 4"  (162.6 cm) Weight: 57.5 kg (126 lb 12.2 oz) IBW/kg (Calculated) : 54.7  Temp (24hrs), Avg:100.2 F (37.9 C), Min:98.9 F (37.2 C), Max:102.4 F (39.1 C)  Recent Labs  Lab 07/06/19 1102 07/06/19 1516 07/06/19 1803 07/07/19 0735  WBC 12.0* 7.7  --  8.2  CREATININE 0.75 0.63  --  0.60  LATICACIDVEN 2.0* 1.8 1.4  --     Estimated Creatinine Clearance: 69.4 mL/min (by C-G formula based on SCr of 0.6 mg/dL).    Allergies  Allergen Reactions  . Codeine Hives  . Shellfish Allergy Hives    Antimicrobials this admission: 5/13 cefepime >> 5/14 5/13 vancomycin >> 5/14 5/13 Flagyl x 1 5/14 Ceftriaxone >>  Dose adjustments this admission:  Microbiology results: 5/13 BCx: sent 5/13 UCx: sent  5/13 TrachAsp: GPC 5/13 MRSA PCR: neg 5/13 Resp PCR: neg 5/13 Strep pneumo: pos 5/13 Legionella: pending  Thank you for allowing pharmacy to be a part of this patient's care.  Minda Ditto PharmD 07/07/2019 9:56 AM

## 2019-07-07 NOTE — Progress Notes (Signed)
[facility] Progress Note Patient Name: Reona Zendejas DOB: 01/03/1966 MRN: [id number]   Date of Service  07/07/2019  HPI/Events of Note  Glucose 70s, on PLR at 125 cc/hr. No on tube feeding  eICU Interventions  Shift IV to D5LR for now until tube feeding can be started     Intervention Category Major Interventions: Other:  [name] 07/07/2019, 4:00 AM

## 2019-07-07 NOTE — Progress Notes (Addendum)
Initial Nutrition Assessment  DOCUMENTATION CODES:   Not applicable  INTERVENTION:  - will order TF regimen: Vital AF 1.2 @ 20 ml/hr via OGT to advance by 10 ml every 12 hours to reach goal rate of 60 ml/hr. - at goal rate, this regimen will provide 1728 kcal, 108 grams protein, and 1168 ml free water.  - order currently in place for 200 ml free water every 4 hours (1200 ml/day)  Monitor magnesium, potassium, and phosphorus daily for at least 3 days, MD to replete as needed, as pt is at risk for refeeding syndrome given current hypokalemia, hypophosphatemia, and hypomagnesemia.   NUTRITION DIAGNOSIS:   Increased nutrient needs related to acute illness, chronic illness, cancer and cancer related treatments as evidenced by estimated needs.  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 She is s/p chemo and radiation and has been on maintenance therapy every 4 weeks. She was admitted on 5/13 with sepsis d/t PNA. She was started on BiPAP but failed and subsequently intubated in the ED.  Patient is intubated with OGT in place (gastric). Will order TF as outlined above. No family/visitors present. Patient did not open eyes but was able to acknowledge RD presence and nod/shake head. She is experiencing slight abdominal pain and slight nausea this AM. Informed her of plan to start TF.   Per chart review, weight today is 127 lb, weight yesterday documented as 112.5 lb, and weight on 01/25/19 was 132 lb. This indicates 5 lb weight loss (3.8% body weight) in the past 5.5 months.   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 Medications reviewed; 100 mg colace BID, 40 mg IV lasix x1 dose 5/14, sliding scale novolog, 4 g IV Mg sulfate x1 run 5/14, 40 mg IV protonix/day, 1 packet miralax per OGT/day, 40 mEq KCl per OGT x2 doses 5/13 and x1 dose 5/14, 20 mmol IV KPhos x1 run 5/14. IVF; D5-LR @ 10 ml/hr (41 kcal). Drips; precedex @ 0.4 mcg/kg/hr, nep @ 50 mcg/min.     NUTRITION - FOCUSED PHYSICAL EXAM:    Most Recent Value  Orbital Region  No depletion  Upper Arm Region  No depletion  Thoracic and Lumbar Region  Unable to assess  Buccal Region  No depletion  Temple Region  No depletion  Clavicle Bone Region  Mild depletion  Clavicle and Acromion Bone Region  Mild depletion  Scapular Bone Region  Unable to assess  Dorsal Hand  No depletion  Patellar Region  No depletion  Anterior Thigh Region  No depletion  Posterior Calf Region  No depletion  Edema (RD Assessment)  None  Hair  Reviewed  Eyes  Unable to assess  Mouth  Unable to assess  Skin  Reviewed  Nails  Reviewed       Diet Order:   Diet Order            Diet NPO time specified  Diet effective now              EDUCATION NEEDS:   No education needs have been identified at this time  Skin:  Skin Assessment: Skin Integrity Issues: Skin Integrity Issues:: Stage I Stage I: R upper shoulder  Last BM:  PTA/unknown  Height:   Ht Readings from Last 1 Encounters:  07/06/19 5\' 4"  (1.626 m)    Weight:   Wt Readings from Last 1  Encounters:  07/07/19 57.5 kg    Ideal Body Weight:  54.5 kg  BMI:  Body mass index is 21.76 kg/m.  Estimated Nutritional Needs:   Kcal:  1761 kcal  Protein:  98-115 grams (1.7-2 grams/kg)  Fluid:  >/= 2 L/day     Jarome Matin, MS, RD, LDN, CNSC Inpatient Clinical Dietitian RD pager # available in [facility]  After hours/weekend pager # available in [facility]

## 2019-07-07 NOTE — TOC Initial Note (Signed)
Transition of Care [facility]) - Initial/Assessment Note    Patient Details  Name: Ashley Mayo MRN: [id number] Date of Birth: 1965/05/23  Transition of Care [facility]) CM/SW Contact:    Leeroy Cha, RN Phone Number: 07/07/2019, 10:33 AM  Clinical Narrative:                 Patient with hx of advanced lung ca, acute resp failure requiring intubation and vent, temp 102.4, pa notes state coma by eeg, from home son lives with patient, outcome tbd.  Iv rocephin, iv precedex and neo.  Expected Discharge Plan: Home/Self Care Barriers to Discharge: Continued Medical Work up   Patient Goals and CMS Choice        Expected Discharge Plan and Services Expected Discharge Plan: Home/Self Care                                              Prior Living Arrangements/Services                       Activities of Daily Living Home Assistive Devices/Equipment: Dentures (specify type), Eyeglasses, Nebulizer(upper/lower dentures) ADL Screening (condition at time of admission) Patient's cognitive ability adequate to safely complete daily activities?: No(patient currently intubated) Is the patient deaf or have difficulty hearing?: No Does the patient have difficulty seeing, even when wearing glasses/contacts?: No Does the patient have difficulty concentrating, remembering, or making decisions?: Yes Patient able to express need for assistance with ADLs?: No Does the patient have difficulty dressing or bathing?: Yes Independently performs ADLs?: No Communication: Dependent(patient currently intubated) Is this a change from baseline?: Change from baseline, expected to last >3 days Dressing (OT): Dependent Is this a change from baseline?: Change from baseline, expected to last >3 days Grooming: Dependent Is this a change from baseline?: Change from baseline, expected to last >3 days Feeding: Dependent Is this a change from baseline?: Change from baseline, expected to last >3  days Bathing: Dependent Is this a change from baseline?: Change from baseline, expected to last >3 days Toileting: Dependent Is this a change from baseline?: Change from baseline, expected to last >3days In/Out Bed: Dependent Is this a change from baseline?: Change from baseline, expected to last >3 days Walks in Home: Dependent Is this a change from baseline?: Change from baseline, expected to last >3 days Does the patient have difficulty walking or climbing stairs?: Yes(secondary to weakness) Weakness of Legs: Both Weakness of Arms/Hands: Both  Permission Sought/Granted                  Emotional Assessment              Admission diagnosis:  Acute respiratory failure ([place]) [J96.00] Severe sepsis ([place]) [A41.9, R65.20] Acute on chronic respiratory failure with hypoxia ([place]) [J96.21] Multifocal pneumonia [J18.9] Patient Active Problem List   Diagnosis Date Noted  . Acute on chronic respiratory failure with hypoxia ([place]) 07/06/2019  . Pressure injury of skin 07/06/2019  . Severe sepsis ([place])   . ADHD 01/27/2019  . History of pulmonary embolism 01/27/2019  . Focal seizure ([place]) 01/27/2019  . Hypomagnesemia 01/25/2019  . Symptomatic anemia   . Thrombocytopenia ([place])   . Leukopenia   . Perforated ear drum, bilateral 01/25/2016  . Multifocal pneumonia 01/23/2016  . Healthcare-associated pneumonia 01/23/2016  . Hypokalemia 01/23/2016  . Anxiety 01/23/2016  . Depression  01/23/2016  . Metastatic lung cancer (metastasis from lung to other site) ([place]) 01/23/2016  . Brain metastasis ([place]) 01/23/2016  . Essential hypertension 01/23/2016   PCP:  System, Pcp Not In Pharmacy:   [facility] [address] [place], [place] [address] [place] [address] Phone: [phone] Fax: [place] #[id number] [address], [address] AT [facility] [place]  [address] Phone: [phone] Fax: [phone]  [facility] [place], [address] [place] [address] Phone: [phone] Fax: [phone]     Social Determinants of Health (SDOH) Interventions    Readmission Risk Interventions No flowsheet data found.

## 2019-07-07 NOTE — Progress Notes (Signed)
Patient transported to CT on 100% and full support. No complications noted during transport or procedure.

## 2019-07-07 NOTE — Progress Notes (Signed)
NAME:  Ashley Mayo, MRN:  [id number], DOB:  [birth year], LOS: 1 ADMISSION DATE:  07/06/2019, CONSULTATION DATE:  5/13 REFERRING MD:  Regenia Skeeter, CHIEF COMPLAINT:  Acute respiratory failure    Brief History   54 year old female with stage IV adenocarcinoma of the lung with metastasis to brain, bone, liver, spine she is status post chemo and radiation.  Maintained on maintenance therapy (pemetrexed extended to Q4W intervals). admitted 5/13 with sepsis and working diagnosis of pneumonia   Past Medical History   Lung cancer, adenocarcinoma of the right upper lobe with metastasis to brain bone, liver completed GT radiosurgery to 4 lesions in brain 2017,completed 4 cycles of 2nd-line standard chemotherapy with carboplatin and pemetrexed. Bevacizumab was held second to PEs/lovenox Rx. She had radiographic decrease in primary lung mass. Decreased RUL lung mass noted in Dec 2020 and initiated maintenance Pemetrexed.   prior pulmonary emboli, depression, GERD, hypertension, asthma, COPD anxiety. Significant Hospital Events   5/13 admitted.  Intubated, empiric antibiotics initiated.  Culture sent 5/14: Mental status improved, still on low-dose pressors.  Suspect medication related.  SIRS parameters improved.  Tolerating higher levels of pressure support.  Not ready to extubate yet.  Urine strep antigen positive, vancomycin and cefepime both stopped, started on ceftriaxone.  CT chest ordered  Consults:    Procedures:  oett 5/13>>>  Significant Diagnostic Tests:  eeg 5/13>>> no seizures, however area of concern noted, recommendation for continuous EEG monitoring should question of clinical seizure arise  Micro Data:  sars 5/13: neg rvp 5/13>>>neg resp 5/13>>abd GPC>>> bcx2 5/13>>> u strep 5/13>> positive  u legionella 5/13>>>   Antimicrobials:  Cefepime 5/13>>>5/13 Ctx 5/14>>> vanc 5/13>>5/14   Interim history/subjective:  Looks better   Objective   Blood pressure (Abnormal) 116/57,  pulse 73, temperature 99.7 F (37.6 C), resp. rate 17, height 5\' 4"  (1.626 m), weight 57.5 kg, SpO2 100 %.    Vent Mode: PRVC FiO2 (%):  [40 %-100 %] 40 % Set Rate:  [10 bmp-20 bmp] 20 bmp Vt Set:  [430 mL] 430 mL PEEP:  [5 cmH20] 5 cmH20 Plateau Pressure:  [9 cmH20-25 cmH20] 20 cmH20   Intake/Output Summary (Last 24 hours) at 07/07/2019 0850 Last data filed at 07/07/2019 0700 Gross per 24 hour  Intake 4527.19 ml  Output 1050 ml  Net 3477.19 ml   Filed Weights   07/06/19 1130 07/07/19 0416  Weight: 51 kg 57.5 kg    Examination: General this is a 54 year old white female she is currently resting in bed and in no acute distress.  She is appropriate today, appears to be tolerating spontaneous breathing trial after first initiating HENT NCAT no JVD MMM orally intubated.  Pulmonary: Diminished, some scattered rhonchi with expiratory wheezing, no initial accessory use, currently on spontaneous breathing trial, her current ventilator parameters are: Vts 200s, RR 40s w/ mild accessory use. This improved when I increased PEEP to 8 and up-titrated PSV Card RRR no MRG abd not tender MS c/o back pain  Ext no edema brisk CR Neuro awake, no focal def. Appropriate   Resolved Hospital Problem list   Severe sepsis w/ lactic acidosis resolved 5/14  Assessment & Plan:   Acute hypoxic respiratory failure in setting of multifocal pneumococcal PNA pcxr personally reviewed: dense left sided consolidations. Bilateral airspace disease. Aeration perhaps a little better.  Tolerating higher levels of PSV but failed SBT Plan CT chest r/o mass  Lasix X 1/kvo IVFs PSV as tolerated.  Cont precedex RASS goal 0 Day 2 vanc  and cefepime; I will stop vanc Await cultures VAP bundle while intubated.    Acute metabolic encephalopathy in the setting of sepsis/septic -->better Plan Cont supportive Cont zyprexa  PAD Protocol adderall on hold  Drug-related hypotension.  Suspect hypotension mostly mediated  by Precedex at this point Plan Continue to titrate phenylephrine for mean arterial pressure greater than 65   Elevated LFT: Has history of liver metastasis Plan Am LFTs  Fluid, electrolyte & acid base imbalance: Hypokalemia, NAG metabolic acidosis and hypomagnesemia, hypophosphatemia  Plan Replace and recheck Add free water  Stage IV adenocarcinoma w/ mets to brain, spine and liver  Plan F/U at [facility]  PRN and scheduled narcotics for chronic pain-->she was supposed to get Hypoplasty today 5/14  Macrocytic anemia  Plan Trend cbc Transfuse for symptomatic anemia or hgb < 7   Mild coagulopathy: sepsis vs underlying liver mets Plan Cont to trend INR    Remote history of pulmonary emboli.  Off anticoagulation as directed by oncology at [facility] prophylactic LMWH Would consider CT if her hypoxia was out of proportion to her CXR.   History of seizures.  Felt secondary to her brain metastasis-->the EEG w/ intermittent rhythmic transitions. Felt low likelihood for seizure but also noted of level of concern would benefit from LTM  [place] If concern for seizure is witnessed we will need to transfer to [facility] for LTM Will eventually need MRI brain    Best practice:  Diet: Tube feeds Pain/Anxiety/Delirium protocol (if indicated): 5/13 VAP protocol (if indicated): 5/13 DVT prophylaxis: Low molecular weight heparin GI prophylaxis: PPI Glucose control: SSI Mobility: Bedrest Code Status: Full code Family Communication: Son and daughter updated at bedside Disposition: Admit to intensive care   Critical care time:  25 minutes    Erick Colace ACNP-BC [place] Pager # [phone] OR # [phone] if no answer

## 2019-07-08 ENCOUNTER — Inpatient Hospital Stay (HOSPITAL_COMMUNITY): Payer: Medicaid Other

## 2019-07-08 DIAGNOSIS — G893 Neoplasm related pain (acute) (chronic): Secondary | ICD-10-CM

## 2019-07-08 DIAGNOSIS — Z9911 Dependence on respirator [ventilator] status: Secondary | ICD-10-CM

## 2019-07-08 DIAGNOSIS — J13 Pneumonia due to Streptococcus pneumoniae: Secondary | ICD-10-CM

## 2019-07-08 DIAGNOSIS — C3491 Malignant neoplasm of unspecified part of right bronchus or lung: Secondary | ICD-10-CM

## 2019-07-08 DIAGNOSIS — E876 Hypokalemia: Secondary | ICD-10-CM

## 2019-07-08 DIAGNOSIS — A419 Sepsis, unspecified organism: Principal | ICD-10-CM

## 2019-07-08 DIAGNOSIS — R6520 Severe sepsis without septic shock: Secondary | ICD-10-CM

## 2019-07-08 LAB — COMPREHENSIVE METABOLIC PANEL
ALT: 17 U/L (ref 0–44)
AST: 31 U/L (ref 15–41)
Albumin: 2 g/dL — ABNORMAL LOW (ref 3.5–5.0)
Alkaline Phosphatase: 168 U/L — ABNORMAL HIGH (ref 38–126)
Anion gap: 8 (ref 5–15)
BUN: 13 mg/dL (ref 6–20)
CO2: 19 mmol/L — ABNORMAL LOW (ref 22–32)
Calcium: 8.1 mg/dL — ABNORMAL LOW (ref 8.9–10.3)
Chloride: 109 mmol/L (ref 98–111)
Creatinine, Ser: 0.61 mg/dL (ref 0.44–1.00)
GFR calc Af Amer: >60 mL/min (ref >60)
GFR calc non Af Amer: >60 mL/min (ref >60)
Glucose, Bld: 119 mg/dL — ABNORMAL HIGH (ref 70–99)
Potassium: 2.7 mmol/L — CL (ref 3.5–5.1)
Sodium: 136 mmol/L (ref 135–145)
Total Bilirubin: 0.5 mg/dL (ref 0.3–1.2)
Total Protein: 5.5 g/dL — ABNORMAL LOW (ref 6.5–8.1)

## 2019-07-08 LAB — CBC
HCT: 23.7 % — ABNORMAL LOW (ref 36.0–46.0)
Hemoglobin: 7.5 g/dL — ABNORMAL LOW (ref 12.0–15.0)
MCH: 36.2 pg — ABNORMAL HIGH (ref 26.0–34.0)
MCHC: 31.6 g/dL (ref 30.0–36.0)
MCV: 114.5 fL — ABNORMAL HIGH (ref 80.0–100.0)
Platelets: 98 10*3/uL — ABNORMAL LOW (ref 150–400)
RBC: 2.07 MIL/uL — ABNORMAL LOW (ref 3.87–5.11)
RDW: 17.3 % — ABNORMAL HIGH (ref 11.5–15.5)
WBC: 6.8 10*3/uL (ref 4.0–10.5)
nRBC: 0.7 % — ABNORMAL HIGH (ref 0.0–0.2)

## 2019-07-08 LAB — GLUCOSE, CAPILLARY
Glucose-Capillary: 114 mg/dL — ABNORMAL HIGH (ref 70–99)
Glucose-Capillary: 107 mg/dL — ABNORMAL HIGH (ref 70–99)
Glucose-Capillary: 114 mg/dL — ABNORMAL HIGH (ref 70–99)
Glucose-Capillary: 99 mg/dL (ref 70–99)
Glucose-Capillary: 106 mg/dL — ABNORMAL HIGH (ref 70–99)
Glucose-Capillary: 111 mg/dL — ABNORMAL HIGH (ref 70–99)

## 2019-07-08 LAB — BASIC METABOLIC PANEL
Anion gap: 10 (ref 5–15)
BUN: 11 mg/dL (ref 6–20)
CO2: 21 mmol/L — ABNORMAL LOW (ref 22–32)
Calcium: 8 mg/dL — ABNORMAL LOW (ref 8.9–10.3)
Chloride: 106 mmol/L (ref 98–111)
Creatinine, Ser: 0.58 mg/dL (ref 0.44–1.00)
GFR calc Af Amer: >60 mL/min (ref >60)
GFR calc non Af Amer: >60 mL/min (ref >60)
Glucose, Bld: 101 mg/dL — ABNORMAL HIGH (ref 70–99)
Potassium: 4.3 mmol/L (ref 3.5–5.1)
Sodium: 137 mmol/L (ref 135–145)

## 2019-07-08 LAB — URINE CULTURE: Culture: NO GROWTH

## 2019-07-08 LAB — MAGNESIUM
Magnesium: 1.6 mg/dL — ABNORMAL LOW (ref 1.7–2.4)
Magnesium: 1.3 mg/dL — ABNORMAL LOW (ref 1.7–2.4)

## 2019-07-08 LAB — PHOSPHORUS
Phosphorus: 2.4 mg/dL — ABNORMAL LOW (ref 2.5–4.6)
Phosphorus: 1.7 mg/dL — ABNORMAL LOW (ref 2.5–4.6)

## 2019-07-08 IMAGING — DX DG CHEST 1V PORT
1 series · 1 of 1 positions shown · non-contrast
Comparison: Chest x-rays dated [DATE] and [DATE].

CLINICAL DATA: Pneumonia

EXAM:
PORTABLE CHEST 1 VIEW

[chest ap]
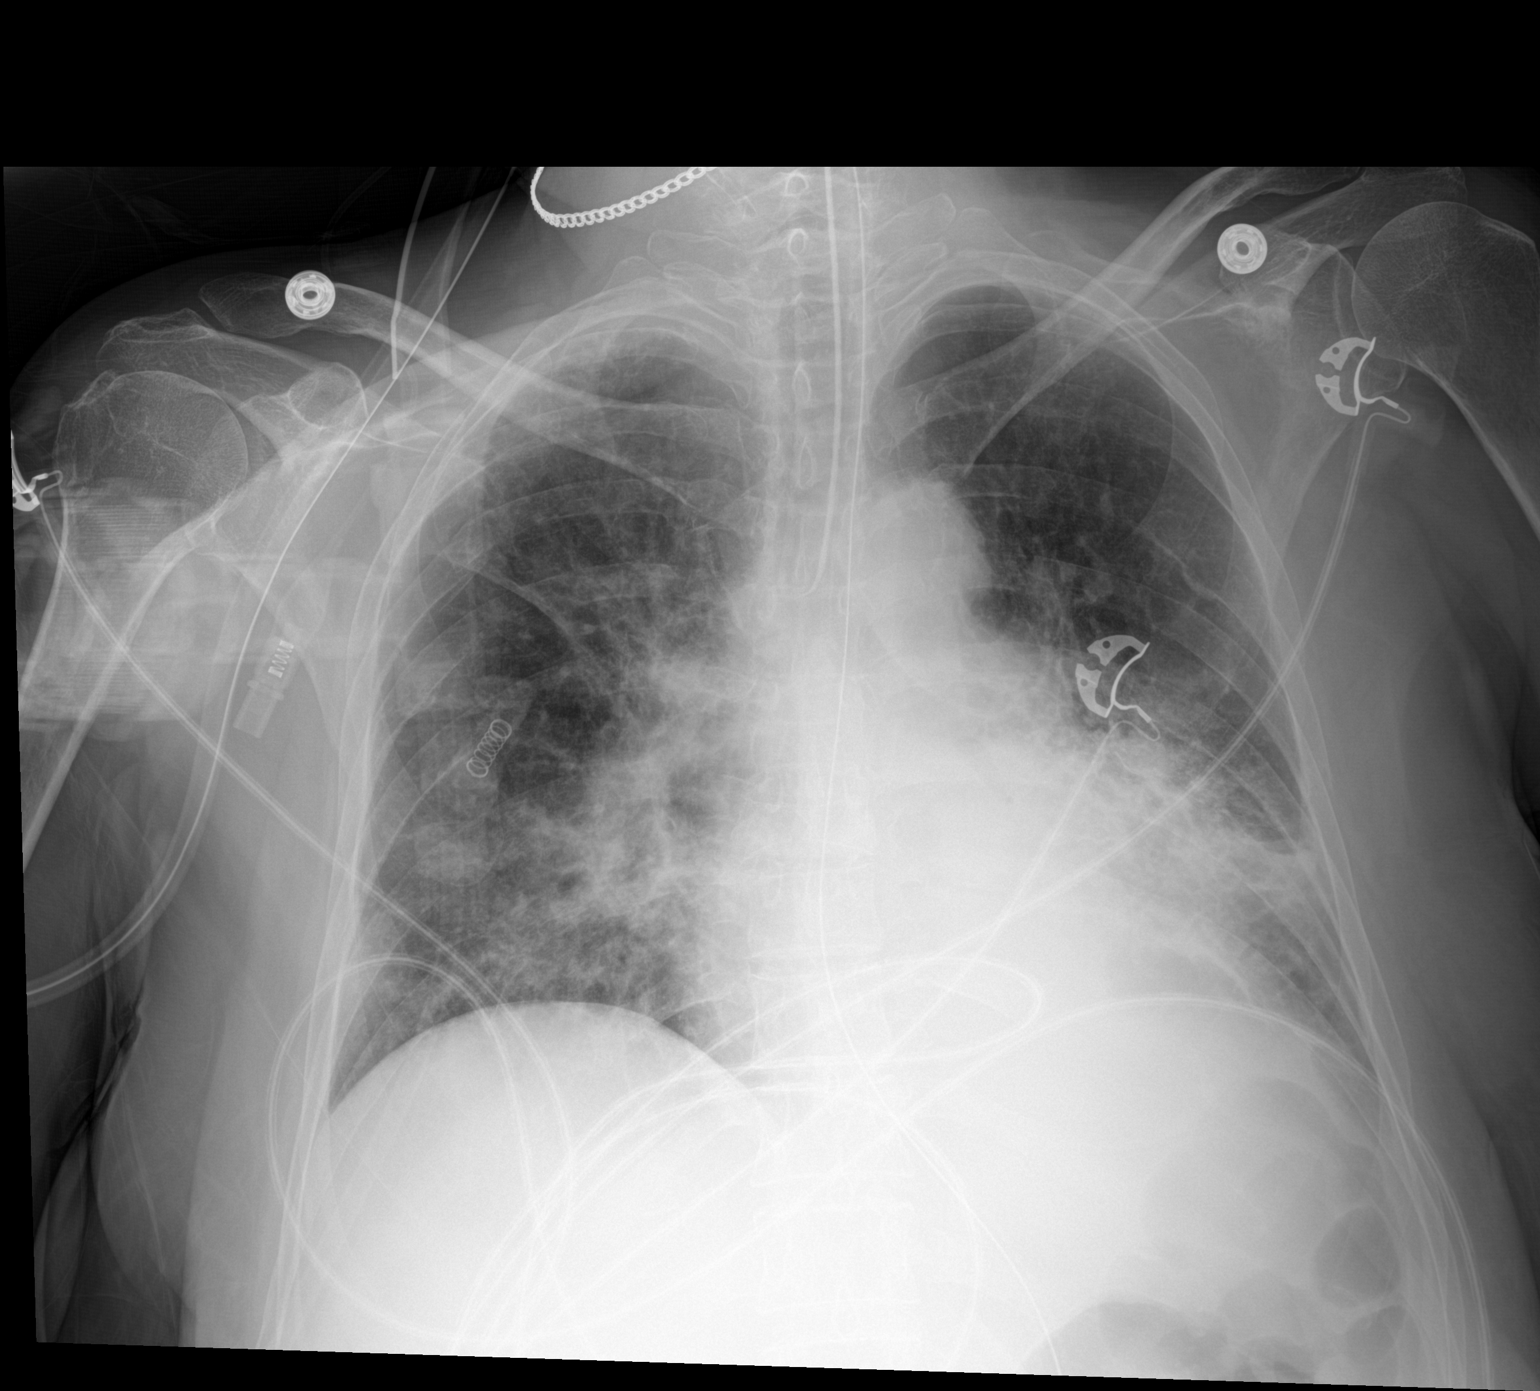

[1 of 1 positions shown; findings below may reference images not displayed]

FINDINGS: Endotracheal tube is well positioned with tip just above the level
the carina. Enteric tube passes below the diaphragm.

Bilateral airspace opacities are not significantly changed compared
to the recent exams. No new lung findings. No pleural effusion or
pneumothorax is seen.
IMPRESSION: 1. Stable bilateral airspace opacities, compatible with multifocal
pneumonia.
2. Endotracheal tube well positioned with tip just above the level
of the carina.

## 2019-07-08 MED ORDER — POTASSIUM CHLORIDE 20 MEQ/15ML (10%) PO SOLN
40.0000 meq | ORAL | Status: AC
  Administered 2019-07-08 (×2): 40 meq
  Filled 2019-07-08 (×2): qty 30

## 2019-07-08 MED ORDER — FENTANYL 100 MCG/HR TD PT72
1.0000 | MEDICATED_PATCH | TRANSDERMAL | Status: DC
  Administered 2019-07-08 – 2019-07-11 (×2): 1 via TRANSDERMAL
  Filled 2019-07-08 (×2): qty 1

## 2019-07-08 MED ORDER — MAGNESIUM SULFATE 4 GM/100ML IV SOLN
4.0000 g | Freq: Once | INTRAVENOUS | Status: AC
  Administered 2019-07-08: 4 g via INTRAVENOUS
  Filled 2019-07-08: qty 100

## 2019-07-08 MED ORDER — SENNOSIDES-DOCUSATE SODIUM 8.6-50 MG PO TABS
1.0000 | ORAL_TABLET | Freq: Two times a day (BID) | ORAL | Status: DC
  Administered 2019-07-08 – 2019-07-10 (×3): 1 via ORAL
  Filled 2019-07-08 (×5): qty 1

## 2019-07-08 MED ORDER — OXYCODONE HCL 5 MG/5ML PO SOLN
10.0000 mg | Freq: Once | ORAL | Status: AC
  Administered 2019-07-08: 10 mg via ORAL
  Filled 2019-07-08: qty 10

## 2019-07-08 MED ORDER — OXYCODONE HCL 5 MG/5ML PO SOLN
30.0000 mg | ORAL | Status: DC
  Administered 2019-07-08 – 2019-07-09 (×4): 30 mg
  Filled 2019-07-08 (×4): qty 30

## 2019-07-08 MED ORDER — POTASSIUM CHLORIDE 10 MEQ/100ML IV SOLN
10.0000 meq | INTRAVENOUS | Status: AC
  Administered 2019-07-08 (×3): 10 meq via INTRAVENOUS
  Filled 2019-07-08 (×3): qty 100

## 2019-07-08 MED ORDER — ACETAMINOPHEN 160 MG/5ML PO SOLN
650.0000 mg | Freq: Four times a day (QID) | ORAL | Status: DC | PRN
  Administered 2019-07-08 – 2019-07-11 (×2): 650 mg
  Filled 2019-07-08 (×2): qty 20.3

## 2019-07-08 MED ORDER — POTASSIUM & SODIUM PHOSPHATES 280-160-250 MG PO PACK
2.0000 | PACK | ORAL | Status: AC
  Administered 2019-07-08 (×2): 2
  Filled 2019-07-08 (×2): qty 2

## 2019-07-08 MED ORDER — POTASSIUM CHLORIDE 10 MEQ/100ML IV SOLN
INTRAVENOUS | Status: AC
  Administered 2019-07-08: 10 meq via INTRAVENOUS
  Filled 2019-07-08: qty 100

## 2019-07-08 NOTE — Progress Notes (Signed)
Nurse and NT attempted to give patient her nightly bath, which she refused. She wrote on her clipboard that she had already received one during the day and did not want another. She also gave lots of pushback when RT tried to suction her throughout the night. She was in a pleasant mood but appears to not want to be messed with at night, even when given pain medication in advance. Will inform day shift nurse.

## 2019-07-08 NOTE — Progress Notes (Signed)
CRITICAL VALUE ALERT  Critical Value:  K+2.7  Date & Time Notied:  5/15 0750  Provider Notified: Dr Carlis Abbott  Orders Received/Actions taken: see new orders

## 2019-07-08 NOTE — Progress Notes (Signed)
NAME:  Ashley Mayo, MRN:  [id number], DOB:  Mar 12, 1965, LOS: 2 ADMISSION DATE:  07/06/2019, CONSULTATION DATE:  5/13 REFERRING MD:  Regenia Skeeter, CHIEF COMPLAINT:  Acute respiratory failure    Brief History   54 year old female with stage IV adenocarcinoma of the lung with metastasis to brain, bone, liver, spine she is status post chemo and radiation.  Maintained on maintenance therapy (pemetrexed extended to Q4W intervals). admitted 5/13 with sepsis and working diagnosis of pneumonia   Past Medical History  Lung cancer (managed at [facility]), adenocarcinoma of the right upper lobe with metastasis to brain, bone, liver completed GT radiosurgery to 4 lesions in brain 2017,completed 4 cycles of 2nd-line standard chemotherapy with carboplatin and pemetrexed. Bevacizumab was held second to PEs/lovenox Rx. She had radiographic decrease in primary lung mass. Decreased RUL lung mass noted in Dec 2020 and initiated maintenance Pemetrexed after completing carboplatin and pemetrexed Nov 2020.   prior pulmonary emboli (on eliquis), depression, GERD, hypertension, asthma, COPD anxiety. Follows with Palliative care at [facility]- remains full code.  Significant Hospital Events   5/13 admitted.  Intubated, empiric antibiotics initiated.  Culture sent 5/14: Mental status improved, still on low-dose pressors.  Suspect medication related.  SIRS parameters improved.  Tolerating higher levels of pressure support.  Not ready to extubate yet.  Urine strep antigen positive, vancomycin and cefepime both stopped, started on ceftriaxone.    Consults:    Procedures:  ett 5/13>>>  Significant Diagnostic Tests:  eeg 5/13>>> no seizures, however area of concern noted, recommendation for continuous EEG monitoring should question of clinical seizure arise CT chest 07/07/2019-  bilateral upper lobe fibrosis and consolidation, left lower lobe and lingular consolidation with some air bronchograms. Partially obstructed LLL bronchus but  no significant volume loss distally. Suspicious for left hilar mass.  Areas of consolidation with air bronchograms in the right lower lobe.  Possibly right hilar adenopathy.  Small dependent pleural effusions bilaterally. Bone mets- spine & ribs.  Micro Data:  sars 5/13: neg rvp 5/13>>>neg resp 5/13>>abd GPC>>> normal flora bcx2 5/13>>> u strep 5/13>> positive  u legionella 5/13>>>   Antimicrobials:  Cefepime 5/13>>>5/13 Ctx 5/14>>> vanc 5/13>>5/14 Metronidazole 5/13  Interim history/subjective:  Still having back pain from compression fractures. Had been planning to have OP kyphoplasty, which was cancelled when she was admitted. Large volume of tracheal secretions.  Objective   Blood pressure 110/69, pulse 70, temperature 99.9 F (37.7 C), temperature source Bladder, resp. rate (!) 21, height 5\' 4"  (1.626 m), weight 58.9 kg, SpO2 100 %.    Vent Mode: PRVC FiO2 (%):  [40 %] 40 % Set Rate:  [20 bmp] 20 bmp Vt Set:  [430 mL] 430 mL PEEP:  [5 cmH20-8 cmH20] 5 cmH20 Pressure Support:  [14 cmH20] 14 cmH20 Plateau Pressure:  [10 cmH20-21 cmH20] 21 cmH20   Intake/Output Summary (Last 24 hours) at 07/08/2019 0815 Last data filed at 07/08/2019 0700 Gross per 24 hour  Intake 4710.5 ml  Output 4450 ml  Net 260.5 ml   Filed Weights   07/06/19 1130 07/07/19 0416 07/08/19 0500  Weight: 51 kg 57.5 kg 58.9 kg    Examination: General:  Frail, chronically ill appearing elderly woman lying in bed in no acute distress. HENT: [place]/AT, eyes anicteric, ETT in place Pulmonary: Rales bilaterally anteriorly, rhonchi cleared with suctioning.  Copious tan secretions from ET tube.  Tachypneic on SBT 8/8. Cardio: Regular rate and rhythm, no murmurs  Abd: Soft, nontender, nondistended  MS: No muscle mass, no edema  or cyanosis Neuro awake and alert, moving all extremities spontaneously, writing to communicate.  Resolved Hospital Problem list   Severe sepsis w/ lactic acidosis resolved  5/14  Assessment & Plan:   Acute hypoxic respiratory failure in setting of multifocal pneumococcal PNA CT with partial filling of left lower lobe bronchus but no atelectasis distally concerning for mucous plug versus tumor. -Continue low tidal volume ventilation, 4-8 cc/kg ideal body weight with goal plateau less than 30 driving pressure less than 15.  Titrate PEEP and FiO2 per ARDS protocol. -Daily SAT and SBT as appropriate.  Failed SBT today due to tachypnea.  Copious secretions remain concerning at this time as well. -VAP prevention protocol -Continue ceftriaxone for pneumococcal pneumonia. -Continue to follow cultures  Acute metabolic encephalopathy in the setting of sepsis/septic --> resolved -Continue supportive care -Continue holding PTA Adderall -Increasing pain medication given huge requirements prior to admission  Drug-related hypotension.  Suspect hypotension mostly mediated by Precedex at this point -Continue vasopressors as needed to maintain MAP greater than 65 -Continue broad-spectrum antibiotics  Elevated LFT- resolved. Synthetic function normal currently. Has history of liver metastasis -Continue to monitor  Fluid, electrolyte & acid base imbalance: Hypokalemia, NAG metabolic acidosis and hypomagnesemia, hypophosphatemia  -repleted -recheck this afternoon  Stage IV adenocarcinoma w/ mets to brain, bones, and liver. Manged by [place] at [facility]. TDD oxycodone (short & long acting) 320mg  per day. Plan -Start fentanyl patch 200 mcg -Oxycodone 30 mg every 4 hours scheduled with hold parameters for the next 24 hours until fentanyl patch starts working. -Continue fentanyl as needed  Macrocytic anemia  Plan Trend cbc Transfuse for symptomatic anemia or hgb < 7   Mild coagulopathy: sepsis vs underlying liver mets Plan -Cont to trend INR periodically  Remote history of pulmonary emboli.  Off anticoagulation as directed by oncology at [facility]  prophylactic lovenox  History of seizures.  Felt secondary to her brain metastasis-->the EEG w/ intermittent rhythmic transitions.  -Cont Keppra    Best practice:  Diet: Tube feeds Pain/Anxiety/Delirium protocol (if indicated): 5/13 VAP protocol (if indicated): 5/13 DVT prophylaxis: Low molecular weight heparin GI prophylaxis: PPI Glucose control: SSI Mobility: Bedrest Code Status: Full code Family Communication: daughter updated at bedside Disposition:ICU    This patient is critically ill with multiple organ system failure which requires frequent high complexity decision making, assessment, support, evaluation, and titration of therapies. This was completed through the application of advanced monitoring technologies and extensive interpretation of multiple databases. During this encounter critical care time was devoted to patient care services described in this note for 60 minutes.  Julian Hy, DO 07/08/19 1:46 PM  Pulmonary & Critical Care

## 2019-07-08 NOTE — Progress Notes (Signed)
[facility] Progress Note Patient Name: Ashley Mayo DOB: 09/23/1965 MRN: [id number]   Date of Service  07/08/2019  HPI/Events of Note  Fever to 101.8 F - Patient is already on Ceftriaxone for pneumococcal pneumonia. Request for Tylenol. AST and ALT both normal.   eICU Interventions  Will order: 1. Tylenol liquid 650 mg per tube Q 6 hours PRN Temp > 101.0 F.     Intervention Category Major Interventions: Infection - evaluation and management  Alysah Carton [place] 07/08/2019, 9:03 PM

## 2019-07-09 ENCOUNTER — Other Ambulatory Visit: Payer: Self-pay

## 2019-07-09 DIAGNOSIS — C7951 Secondary malignant neoplasm of bone: Secondary | ICD-10-CM

## 2019-07-09 DIAGNOSIS — R6521 Severe sepsis with septic shock: Secondary | ICD-10-CM

## 2019-07-09 DIAGNOSIS — J189 Pneumonia, unspecified organism: Secondary | ICD-10-CM

## 2019-07-09 LAB — CBC
HCT: 26 % — ABNORMAL LOW (ref 36.0–46.0)
Hemoglobin: 8.3 g/dL — ABNORMAL LOW (ref 12.0–15.0)
MCH: 36.1 pg — ABNORMAL HIGH (ref 26.0–34.0)
MCHC: 31.9 g/dL (ref 30.0–36.0)
MCV: 113 fL — ABNORMAL HIGH (ref 80.0–100.0)
Platelets: 115 10*3/uL — ABNORMAL LOW (ref 150–400)
RBC: 2.3 MIL/uL — ABNORMAL LOW (ref 3.87–5.11)
RDW: 17.5 % — ABNORMAL HIGH (ref 11.5–15.5)
WBC: 5.7 10*3/uL (ref 4.0–10.5)
nRBC: 0.4 % — ABNORMAL HIGH (ref 0.0–0.2)

## 2019-07-09 LAB — COMPREHENSIVE METABOLIC PANEL
ALT: 16 U/L (ref 0–44)
AST: 26 U/L (ref 15–41)
Albumin: 2 g/dL — ABNORMAL LOW (ref 3.5–5.0)
Alkaline Phosphatase: 162 U/L — ABNORMAL HIGH (ref 38–126)
Anion gap: 7 (ref 5–15)
BUN: 14 mg/dL (ref 6–20)
CO2: 26 mmol/L (ref 22–32)
Calcium: 8.5 mg/dL — ABNORMAL LOW (ref 8.9–10.3)
Chloride: 101 mmol/L (ref 98–111)
Creatinine, Ser: 0.52 mg/dL (ref 0.44–1.00)
GFR calc Af Amer: >60 mL/min (ref >60)
GFR calc non Af Amer: >60 mL/min (ref >60)
Glucose, Bld: 113 mg/dL — ABNORMAL HIGH (ref 70–99)
Potassium: 3.8 mmol/L (ref 3.5–5.1)
Sodium: 134 mmol/L — ABNORMAL LOW (ref 135–145)
Total Bilirubin: 0.4 mg/dL (ref 0.3–1.2)
Total Protein: 5.7 g/dL — ABNORMAL LOW (ref 6.5–8.1)

## 2019-07-09 LAB — GLUCOSE, CAPILLARY
Glucose-Capillary: 102 mg/dL — ABNORMAL HIGH (ref 70–99)
Glucose-Capillary: 105 mg/dL — ABNORMAL HIGH (ref 70–99)
Glucose-Capillary: 107 mg/dL — ABNORMAL HIGH (ref 70–99)
Glucose-Capillary: 69 mg/dL — ABNORMAL LOW (ref 70–99)
Glucose-Capillary: 73 mg/dL (ref 70–99)
Glucose-Capillary: 89 mg/dL (ref 70–99)
Glucose-Capillary: 98 mg/dL (ref 70–99)

## 2019-07-09 LAB — PHOSPHORUS: Phosphorus: 3.3 mg/dL (ref 2.5–4.6)

## 2019-07-09 LAB — CULTURE, RESPIRATORY W GRAM STAIN: Culture: NORMAL

## 2019-07-09 LAB — PROTIME-INR
INR: 1.2 (ref 0.8–1.2)
Prothrombin Time: 14.2 seconds (ref 11.4–15.2)

## 2019-07-09 LAB — MAGNESIUM: Magnesium: 1.5 mg/dL — ABNORMAL LOW (ref 1.7–2.4)

## 2019-07-09 MED ORDER — OXYCODONE HCL 20 MG/ML PO CONC
40.0000 mg | ORAL | Status: DC
  Administered 2019-07-09 – 2019-07-10 (×5): 40 mg
  Filled 2019-07-09 (×5): qty 2

## 2019-07-09 MED ORDER — ORAL CARE MOUTH RINSE
15.0000 mL | Freq: Two times a day (BID) | OROMUCOSAL | Status: DC
  Administered 2019-07-09 – 2019-07-11 (×4): 15 mL via OROMUCOSAL

## 2019-07-09 MED ORDER — MAGNESIUM SULFATE 2 GM/50ML IV SOLN
2.0000 g | Freq: Once | INTRAVENOUS | Status: AC
  Administered 2019-07-09: 2 g via INTRAVENOUS
  Filled 2019-07-09: qty 50

## 2019-07-09 MED ORDER — POTASSIUM CHLORIDE 20 MEQ/15ML (10%) PO SOLN
40.0000 meq | Freq: Once | ORAL | Status: AC
  Administered 2019-07-09: 40 meq
  Filled 2019-07-09: qty 30

## 2019-07-09 MED ORDER — DEXMEDETOMIDINE HCL IN NACL 200 MCG/50ML IV SOLN
0.4000 ug/kg/h | INTRAVENOUS | Status: DC
  Administered 2019-07-09: 0.4 ug/kg/h via INTRAVENOUS
  Filled 2019-07-09: qty 50

## 2019-07-09 MED ORDER — LIDOCAINE 5 % EX PTCH
1.0000 | MEDICATED_PATCH | CUTANEOUS | Status: DC
  Administered 2019-07-09 – 2019-07-11 (×3): 1 via TRANSDERMAL
  Filled 2019-07-09 (×3): qty 1

## 2019-07-09 NOTE — Progress Notes (Signed)
Hypoglycemic Event  CBG: 69  Treatment: 8 oz juice/soda  Symptoms: None  Follow-up CBG: Time:2048 CBG Result:102  Possible Reasons for Event: Change in activity  Comments/MD notified:    Tyrone Nine

## 2019-07-09 NOTE — Procedures (Signed)
Extubation Procedure Note  Patient Details:   Name: Ashley Mayo DOB: 12/09/1965 MRN: [id number]   Airway Documentation:    Vent end date: 07/09/19 Vent end time: 1459   Evaluation  O2 sats: stable throughout Complications: No apparent complications Patient did tolerate procedure well. Bilateral Breath Sounds: Rhonchi   Yes   Pt extubated per CCM order to 2L [place] with 2 RRT's at bedside, RN aware. No apparent complications noted at this time, cuff leak heard prior to extubation. Pt is able to speak and requesting her dentures and sips of water. RRT educated pt on being NPO for now and RN will assess ability to take sips of fluids later on.  Esperanza Sheets T 07/09/2019, 3:00 PM

## 2019-07-09 NOTE — Progress Notes (Signed)
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  Maintained on maintenance therapy (pemetrexed extended to Q4W intervals). admitted 5/13 with sepsis and working diagnosis of pneumonia   Past Medical History  Lung cancer (managed at [facility]), adenocarcinoma of the right upper lobe with metastasis to brain, bone, liver completed GT radiosurgery to 4 lesions in brain 2017,completed 4 cycles of 2nd-line standard chemotherapy with carboplatin and pemetrexed. Bevacizumab was held second to PEs/lovenox Rx. She had radiographic decrease in primary lung mass. Decreased RUL lung mass noted in Dec 2020 and initiated maintenance Pemetrexed after completing carboplatin and pemetrexed Nov 2020.   prior pulmonary emboli (on eliquis), depression, GERD, hypertension, asthma, COPD anxiety. Follows with Palliative care at [facility]- remains full code.  Significant Hospital Events   5/13 admitted.  Intubated, empiric antibiotics initiated.  Culture sent 5/14: Mental status improved, still on low-dose pressors.  Suspect medication related.  SIRS parameters improved.  Tolerating higher levels of pressure support.  Not ready to extubate yet.  Urine strep antigen positive, vancomycin and cefepime both stopped, started on ceftriaxone.    Consults:    Procedures:  ett 5/13>>>  Significant Diagnostic Tests:  eeg 5/13>>> no seizures, however area of concern noted, recommendation for continuous EEG monitoring should question of clinical seizure arise CT chest 07/07/2019-  bilateral upper lobe fibrosis and consolidation, left lower lobe and lingular consolidation with some air bronchograms. Partially obstructed LLL bronchus but  no significant volume loss distally. Suspicious for left hilar mass.  Areas of consolidation with air bronchograms in the right lower lobe.  Possibly right hilar adenopathy.  Small dependent pleural effusions bilaterally. Bone mets- spine & ribs.  Micro Data:  sars 5/13: neg rvp 5/13>>>neg resp 5/13>>abd GPC>>> normal flora bcx2 5/13>>> NGTD u strep 5/13>> positive  u legionella 5/13>>>   Antimicrobials:  Cefepime 5/13>>>5/13 Ctx 5/14>>> vanc 5/13>>5/14 Metronidazole 5/13  Interim history/subjective:  Febrile overnight.  Her pain is improved, but still not fully controlled.  She is requiring frequent boluses of fentanyl.  She has some throat pain, most of her pain is related to her sternum.  She has been able to tolerate suctioning and coughing much better today.  Objective   Blood pressure 101/60, pulse 77, temperature 99.7 F (37.6 C), resp. rate 16, height 5\' 4"  (1.626 m), weight 58.9 kg, SpO2 98 %.    Vent Mode: PSV;CPAP FiO2 (%):  [25 %] 25 % Set Rate:  [20 bmp] 20 bmp Vt Set:  [430 mL-560 mL] 560 mL PEEP:  [5 cmH20] 5 cmH20 Pressure Support:  [8 DQQ22-97 cmH20] 12 cmH20 Plateau Pressure:  [20 cmH20-24 cmH20] 20 cmH20   Intake/Output Summary (Last 24 hours) at 07/09/2019 1002 Last data filed at 07/09/2019 0800 Gross per 24 hour  Intake 1924.78 ml  Output 3900 ml  Net -1975.22 ml   Filed Weights   07/06/19 1130 07/07/19 0416 07/08/19 0500  Weight: 51 kg 57.5 kg 58.9 kg    Examination: General: Frail, chronically ill-appearing woman lying in bed intubated, sedated HENT: [place]/AT, eyes anicteric, oral mucosa moist.  ETT and OGT in place. Pulmonary: No rhonchi, rales bilaterally.  Tachypneic, breathing comfortably on SBT 8/8.  Thin tan secretions from ET  tube. Cardio: Regular rate and rhythm, no murmurs Abd: Soft, nontender, nondistended MS: No clubbing, cyanosis, or edema Neuro: Awake and alert, RASS equals zero.  Writing to communicate.  Moving all extremities  spontaneously.  Strength improved compared to previous exams..  Resolved Hospital Problem list   Severe sepsis w/ lactic acidosis resolved 5/14  Assessment & Plan:   Acute hypoxic respiratory failure in setting of multifocal pneumococcal PNA CT with partial filling of left lower lobe bronchus but no atelectasis distally concerning for mucous plug versus tumor. -Continue low tidal volume ventilation, 4 to 8 cc/kg ideal body weight with goal plateau less than 30 and driving pressure less than 15.  Titrate PEEP and FiO2 per ARDS protocol -Daily SAT and SBT.  Now on 8/8.  Remain concerned about copious secretions, but she is tolerating clearing them much better today. -Background to protocol -Repeat trach aspirate -Continue ceftriaxone.  Depending on WBC, may escalate antibiotics. -Continue to follow blood cultures  Febrile overnight, unclear etiology -Continue to monitor -CBC this morning  Acute metabolic encephalopathy in the setting of sepsis/septic --> resolved -Continue supportive care and delirium precautions -Continue holding PTA Adderall -Continue fentanyl patch, 200 mcg/h.  Increasing oxycodone to 40 mg every 4 hours with hold parameters to control pain.  IV fentanyl as needed.  Drug-related hypotension.  Suspect hypotension mostly mediated by Precedex at this point -Continue vasopressors as required to maintain MAP greater than 65 -Continue broad-spectrum antibiotics  Elevated LFT- resolved. Synthetic function normal currently. Has history of liver metastasis -Continue to monitor  Fluid, electrolyte & acid base imbalance: Hypokalemia, NAG metabolic acidosis and hypomagnesemia, hypophosphatemia  -Recheck this morning  Stage IV adenocarcinoma w/ mets to brain, bones, and liver. Manged by [place] at [facility]. TDD oxycodone (short & long acting) 320mg  per day. Plan -Continue fentanyl patch 200 mcg-should start seeing the effect of this this afternoon -Oxycodone  increased to 40 mg every 4 hours scheduled with hold parameters for the next 24 hours.  Hold parameters will be important as she likely will need this decreased once fentanyl was fully active. -Continue the fentanyl as needed  Macrocytic anemia; likely due to chronic illness Plan -Continue to monitor -Transfuse for hemoglobin less than seven or hemodynamically significant bleed   Mild coagulopathy: sepsis vs underlying liver mets Plan -Cont to trend INR periodically  Remote history of pulmonary emboli.  Off anticoagulation as directed by oncology at [place] prophylactic Lovenox -If she remains febrile will pursue lower extremity Dopplers, although no edema on exam to suggest DVT  History of seizures.  Felt secondary to her brain metastasis-->the EEG w/ intermittent rhythmic transitions.  -Continue Keppra    Best practice:  Diet: Tube feeds Pain/Anxiety/Delirium protocol (if indicated): 5/13 VAP protocol (if indicated): 5/13 DVT prophylaxis: Low molecular weight heparin GI prophylaxis: PPI Glucose control: SSI Mobility: Bedrest Code Status: Full code Family Communication: daughter updated at bedside Disposition:ICU    This patient is critically ill with multiple organ system failure which requires frequent high complexity decision making, assessment, support, evaluation, and titration of therapies. This was completed through the application of advanced monitoring technologies and extensive interpretation of multiple databases. During this encounter critical care time was devoted to patient care services described in this note for 40 minutes.  Julian Hy, DO 07/09/19 10:33 AM [facility]

## 2019-07-10 LAB — GLUCOSE, CAPILLARY
Glucose-Capillary: 88 mg/dL (ref 70–99)
Glucose-Capillary: 111 mg/dL — ABNORMAL HIGH (ref 70–99)
Glucose-Capillary: 91 mg/dL (ref 70–99)
Glucose-Capillary: 99 mg/dL (ref 70–99)

## 2019-07-10 LAB — BASIC METABOLIC PANEL
Anion gap: 9 (ref 5–15)
BUN: 10 mg/dL (ref 6–20)
CO2: 29 mmol/L (ref 22–32)
Calcium: 9.5 mg/dL (ref 8.9–10.3)
Chloride: 99 mmol/L (ref 98–111)
Creatinine, Ser: 0.61 mg/dL (ref 0.44–1.00)
GFR calc Af Amer: >60 mL/min (ref >60)
GFR calc non Af Amer: >60 mL/min (ref >60)
Glucose, Bld: 106 mg/dL — ABNORMAL HIGH (ref 70–99)
Potassium: 4.6 mmol/L (ref 3.5–5.1)
Sodium: 137 mmol/L (ref 135–145)

## 2019-07-10 LAB — CBC
HCT: 28.2 % — ABNORMAL LOW (ref 36.0–46.0)
Hemoglobin: 9 g/dL — ABNORMAL LOW (ref 12.0–15.0)
MCH: 36.3 pg — ABNORMAL HIGH (ref 26.0–34.0)
MCHC: 31.9 g/dL (ref 30.0–36.0)
MCV: 113.7 fL — ABNORMAL HIGH (ref 80.0–100.0)
Platelets: 121 10*3/uL — ABNORMAL LOW (ref 150–400)
RBC: 2.48 MIL/uL — ABNORMAL LOW (ref 3.87–5.11)
RDW: 18.1 % — ABNORMAL HIGH (ref 11.5–15.5)
WBC: 6.5 10*3/uL (ref 4.0–10.5)
nRBC: 0.3 % — ABNORMAL HIGH (ref 0.0–0.2)

## 2019-07-10 MED ORDER — OLANZAPINE 5 MG PO TABS
5.0000 mg | ORAL_TABLET | Freq: Two times a day (BID) | ORAL | Status: DC
  Administered 2019-07-10 – 2019-07-11 (×3): 5 mg via ORAL
  Filled 2019-07-10 (×3): qty 1

## 2019-07-10 MED ORDER — FENTANYL CITRATE (PF) 100 MCG/2ML IJ SOLN
25.0000 ug | INTRAMUSCULAR | Status: DC | PRN
  Administered 2019-07-10 (×3): 50 ug via INTRAVENOUS
  Administered 2019-07-11 (×2): 25 ug via INTRAVENOUS
  Filled 2019-07-10 (×5): qty 2

## 2019-07-10 MED ORDER — BACITRACIN ZINC 500 UNIT/GM EX OINT
1.0000 "application " | TOPICAL_OINTMENT | Freq: Two times a day (BID) | CUTANEOUS | Status: DC
  Administered 2019-07-11: 1 via TOPICAL
  Filled 2019-07-10: qty 0.9

## 2019-07-10 MED ORDER — PANTOPRAZOLE SODIUM 40 MG PO TBEC
40.0000 mg | DELAYED_RELEASE_TABLET | Freq: Every day | ORAL | Status: DC
  Administered 2019-07-10: 40 mg via ORAL
  Filled 2019-07-10: qty 1

## 2019-07-10 MED ORDER — OXYCODONE HCL ER 20 MG PO T12A
20.0000 mg | EXTENDED_RELEASE_TABLET | Freq: Two times a day (BID) | ORAL | Status: DC
  Administered 2019-07-10 – 2019-07-11 (×4): 20 mg via ORAL
  Filled 2019-07-10 (×4): qty 1

## 2019-07-10 MED ORDER — DOCUSATE SODIUM 100 MG PO CAPS
100.0000 mg | ORAL_CAPSULE | Freq: Two times a day (BID) | ORAL | Status: DC
  Filled 2019-07-10 (×3): qty 1

## 2019-07-10 MED ORDER — LEVETIRACETAM 500 MG PO TABS
500.0000 mg | ORAL_TABLET | Freq: Two times a day (BID) | ORAL | Status: DC
  Administered 2019-07-10 – 2019-07-11 (×3): 500 mg via ORAL
  Filled 2019-07-10 (×3): qty 1

## 2019-07-10 MED ORDER — BACITRACIN ZINC 500 UNIT/GM EX OINT
TOPICAL_OINTMENT | Freq: Two times a day (BID) | CUTANEOUS | Status: DC
  Filled 2019-07-10: qty 28.35

## 2019-07-10 NOTE — Progress Notes (Addendum)
eLink Physician-Brief Progress Note Patient Name: Ashley Mayo DOB: 05-Jul-1965 MRN: [id number]   Date of Service  07/10/2019  HPI/Events of Note  Continues to be in pain and states oxycontin works for her  eICU Interventions  Oxycontin 20 mg q 12 ordered (home dose)  It appears that she is followed by Dr Morton Stall with the following prescription  OxyContin 80 mg every 6 hours, oxycodone IR 30-60 mg every 3 hours prn , acetaminophen [phone] mg TID prn, carisoprodol 350 mg QID prn"  This will have to be gradually added on as patient now is on a Fentanyl patch     Intervention Category Intermediate Interventions: Pain - evaluation and management  [name] 07/10/2019, 12:39 AM

## 2019-07-10 NOTE — Progress Notes (Signed)
Pharmacy Antibiotic Note  Ashley Mayo is a 54 y.o. female with a h/o adenocarcinoma of the lung with mets admitted on 07/06/2019 with pneumonia.  Pharmacy has been consulted for ceftriaxone dosing. 07/10/2019 D#5 abx, D#4 CTX, AF/hypothermic. WBC WNL, SCr WNL  Extubated 5/16  Plan:  continue Ceftriaxone 2gm q24  Height: 5\' 4"  (162.6 cm) Weight: 56.2 kg (123 lb 14.4 oz) IBW/kg (Calculated) : 54.7  Temp (24hrs), Avg:97.8 F (36.6 C), Min:97.4 F (36.3 C), Max:98.8 F (37.1 C)  Recent Labs  Lab 07/06/19 1102 07/06/19 1102 07/06/19 1516 07/06/19 1516 07/06/19 1803 07/07/19 0735 07/08/19 0558 07/08/19 1653 07/09/19 1029 07/10/19 0509  WBC 12.0*   < > 7.7  --   --  8.2 6.8  --  5.7 6.5  CREATININE 0.75   < > 0.63   < >  --  0.60 0.61 0.58 0.52 0.61  LATICACIDVEN 2.0*  --  1.8  --  1.4  --   --   --   --   --    < > = values in this interval not displayed.    Estimated Creatinine Clearance: 69.4 mL/min (by C-G formula based on SCr of 0.61 mg/dL).    Allergies  Allergen Reactions  . Codeine Hives  . Shellfish Allergy Hives   Antimicrobials this admission: 5/14 CTX >> 5/13 cefepime >> 5/14  5/13 vancomycin >> 5/14 5/13 Flagyl x 1  Dose adjustments this admission:   Microbiology results: 5/13 BCx: ngF 5/13 UCx: ng-Final 5/13 TrachAsp: normal flora - final 5/13 MRSA PCR: neg 5/13 Resp PCR: neg 5/13 Strep pneumo: pos 5/13 Legionella: neg 5/16 TrachAsp: gram stain neg, NG<24 hrs  Thank you for allowing pharmacy to be a part of this patient's care.  Eudelia Bunch, Pharm.D 07/10/2019 9:05 AM

## 2019-07-10 NOTE — TOC Progression Note (Signed)
Transition of Care [facility]) - Progression Note    Patient Details  Name: Ashley Mayo MRN: [id number] Date of Birth: Feb 23, 1966  Transition of Care [facility]) CM/SW Contact  Leeroy Cha, RN Phone Number: 07/10/2019, 8:38 AM  Clinical Narrative:    Extubated on [phone] now on room air,Iv rocephin, iv precedex, iv neo synephrine.   Expected Discharge Plan: Home/Self Care Barriers to Discharge: Continued Medical Work up  Expected Discharge Plan and Services Expected Discharge Plan: Home/Self Care                                               Social Determinants of Health (SDOH) Interventions    Readmission Risk Interventions No flowsheet data found.

## 2019-07-10 NOTE — Progress Notes (Addendum)
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  Maintained on maintenance therapy (pemetrexed extended to Q4W intervals). Admitted 5/13 with sepsis and working diagnosis of pneumonia.  Past Medical History  Lung cancer (managed at [facility]), adenocarcinoma of the right upper lobe with metastasis to brain, bone, liver completed GT radiosurgery to 4 lesions in brain 2017,completed 4 cycles of 2nd-line standard chemotherapy with carboplatin and pemetrexed. Bevacizumab was held second to PEs/lovenox Rx. She had radiographic decrease in primary lung mass. Decreased RUL lung mass noted in Dec 2020 and initiated maintenance Pemetrexed after completing carboplatin and pemetrexed Nov 2020. Prior pulmonary emboli (on eliquis), depression, GERD, hypertension, asthma, COPD anxiety. Follows with Palliative care at [facility]- remains full code.   Significant Hospital Events   5/13 admitted.  Intubated, empiric antibiotics initiated.  Culture sent 5/14 Mental status improved, still on low-dose pressors.  Suspect medication related.  SIRS parameters improved.  Tolerating higher levels of pressure support.  Not ready to extubate yet.  Urine strep antigen positive, vancomycin and cefepime both stopped, started on ceftriaxone.   5/16 Febrile overnight.  Her pain is improved, but still not fully controlled.  She is requiring frequent boluses of fentanyl.  She has some throat pain, most of her pain is related to her sternum.  She has been able to tolerate suctioning and coughing much better today.  Consults:    Procedures:  ETT 5/13 >> 5/16  Significant Diagnostic Tests:   EEG 5/13 >> no seizures, however area of concern  noted, recommendation for continuous EEG monitoring should question of clinical seizure arise  CT chest 5/14 >>  bilateral upper lobe fibrosis and consolidation, left lower lobe and lingular consolidation with some air bronchograms. Partially obstructed LLL bronchus but no significant volume loss distally. Suspicious for left hilar mass.  Areas of consolidation with air bronchograms in the right lower lobe.  Possibly right hilar adenopathy.  Small dependent pleural effusions bilaterally. Bone mets- spine & ribs.  Micro Data:  sars 5/13 >> neg rvp 5/13 >> neg resp 5/13 >> normal flora bcx2 5/13 >>  UC 5/13 >> negative   u strep 5/13 >> positive  u legionella 5/13 >> negative   Antimicrobials:  Cefepime 5/13 >> 5/13 Metronidazole 5/13 x1  Ceftriaxone 5/14 >> Vanc 5/13 >> 5/14  Interim history/subjective:  Afebrile / WBC 6.5 Tolerated extubation - on room air  Glucose range 88-105 I/O - 2L UOP, -753ml in last 24h Pt reports she is feeling better, still coughing up sputum, having pain in her back from discomfort of bed  Objective   Blood pressure 118/82, pulse 96, temperature (!) 97.4 F (36.3 C), temperature source Oral, resp. rate 14, height 5\' 4"  (1.626 m), weight 56.2 kg, SpO2 95 %.        Intake/Output Summary (Last 24 hours) at 07/10/2019 1103 Last data filed at 07/10/2019 0500 Gross per 24 hour  Intake 813.06 ml  Output 1900 ml  Net -1086.94 ml   Filed Weights   07/08/19 0500 07/09/19 0930 07/10/19 0500  Weight: 58.9 kg 59.7 kg 56.2 kg    Examination: General: chronically ill appearing adult female lying in bed in NAD HEENT: MM pink/moist, no jvd,  on RA  Neuro: AAOx4, speech clear, MAE  CV: s1s2 RRR, no m/r/g PULM:  Non-labored on RA, crackles on left  GI: soft, bsx4 active  Extremities: warm/dry, no edema, BLE symmetrical   Skin: no rashes or lesions  Resolved Hospital Problem list   Severe sepsis w/ lactic acidosis resolved 0/21 Acute Metabolic  Encephalopathy in setting of Sepsis  Elevated LFT's - hx of liver metastasis, synthetic function normal Drug Related Hypotension   Assessment & Plan:   Acute hypoxic respiratory failure in setting of multifocal pneumococcal PNA CT with partial filling of left lower lobe bronchus but no atelectasis distally concerning for mucous plug versus tumor.  Extubated 5/16, on room air.  -pulmonary hygiene - IS, mobilize -PT efforts  -D5/7 abx  -follow intermittent CXR   Febrile 5/16, unclear etiology -resolved -monitor fever curve / WBC trend  Chronic Pain secondary to Metastatic Adenocarcinoma  Stage IV adenocarcinoma w/ mets to brain, bones, and liver.  Manged by [place] at [facility]. TDD oxycodone (short & long acting) 320mg  per day. -continue fentanyl patch 225mcg/hr -continue oxycodone 20 mg Q12  -PRN IV fentanyl  -hold home Adderall  -continue zyprexa   Fluid, electrolyte & acid base imbalance: Hypokalemia, NAG metabolic acidosis and hypomagnesemia, hypophosphatemia  -follow electrolytes, replace as indicated   Macrocytic anemia Likely due to chronic illness -monitor CBC -transfuse for Hgb <7% or active bleeding   Mild coagulopathy: sepsis vs underlying liver mets -follow intermittent INR   Remote history of pulmonary emboli.   Off anticoagulation as directed by oncology at [facility] -continue empiric lovenox  -consider LE doppler if new fever or swelling   History of seizures.   Felt secondary to her brain metastasis-->the EEG w/ intermittent rhythmic transitions.  -continue keppra   GOC Based on prior conversation, patient requests full code.  Daughter is newly pregnant and patient is hopeful to live to see grandchild.  She has been reportedly told by ONC that this may not be a realistic expectation.   Best practice:  Diet: as tolerated, regular  Pain/Anxiety/Delirium protocol (if indicated): n/a VAP protocol (if indicated): n/a DVT prophylaxis: Low molecular  weight heparin GI prophylaxis: PPI Glucose control: SSI Mobility: Bedrest Code Status: Full code Family Communication: Patient updated on plan of care.  Disposition: transition out to floor, to [facility] as of 5/18   Noe Gens, MSN, NP-C [facility] 07/10/2019, 11:33 AM   Please see [email or web address] for pager details.

## 2019-07-10 NOTE — Plan of Care (Signed)

## 2019-07-10 NOTE — Consult Note (Signed)
WOC Nurse Consult Note: Reason for Consult: DTPI to forehead Wound type:Pressure Pressure Injury POA: No Measurement: 0.3cm x 1cm area of purple discoloration Wound bed:N/A Drainage (amount, consistency, odor) None Periwound:intact, dry. Dressing procedure/placement/frequency: Wound care orders provided for Nursing staff for twice daily application of bacitracin ointment to the area. Patient requests the same to be simultaneously applied to another small lesion on the right temporal area that was an injury PTA.  [facility] nursing team will follow, seeing patient every 7-10 days while in house.   Please re-consult if needed. Thanks, Maudie Flakes, MSN, RN, [place], [facility]  Pager# [phone]

## 2019-07-11 DIAGNOSIS — Z86711 Personal history of pulmonary embolism: Secondary | ICD-10-CM

## 2019-07-11 DIAGNOSIS — D696 Thrombocytopenia, unspecified: Secondary | ICD-10-CM

## 2019-07-11 DIAGNOSIS — F419 Anxiety disorder, unspecified: Secondary | ICD-10-CM

## 2019-07-11 DIAGNOSIS — C3490 Malignant neoplasm of unspecified part of unspecified bronchus or lung: Secondary | ICD-10-CM

## 2019-07-11 DIAGNOSIS — C7931 Secondary malignant neoplasm of brain: Secondary | ICD-10-CM

## 2019-07-11 LAB — CULTURE, RESPIRATORY W GRAM STAIN: Culture: NO GROWTH

## 2019-07-11 LAB — BASIC METABOLIC PANEL
Anion gap: 11 (ref 5–15)
BUN: 10 mg/dL (ref 6–20)
CO2: 31 mmol/L (ref 22–32)
Calcium: 8.9 mg/dL (ref 8.9–10.3)
Chloride: 98 mmol/L (ref 98–111)
Creatinine, Ser: 0.73 mg/dL (ref 0.44–1.00)
GFR calc Af Amer: >60 mL/min (ref >60)
GFR calc non Af Amer: >60 mL/min (ref >60)
Glucose, Bld: 99 mg/dL (ref 70–99)
Potassium: 4.2 mmol/L (ref 3.5–5.1)
Sodium: 140 mmol/L (ref 135–145)

## 2019-07-11 LAB — CULTURE, BLOOD (ROUTINE X 2)
Culture: NO GROWTH
Culture: NO GROWTH
Special Requests: ADEQUATE

## 2019-07-11 LAB — GLUCOSE, CAPILLARY
Glucose-Capillary: 100 mg/dL — ABNORMAL HIGH (ref 70–99)
Glucose-Capillary: 88 mg/dL (ref 70–99)
Glucose-Capillary: 91 mg/dL (ref 70–99)
Glucose-Capillary: 96 mg/dL (ref 70–99)

## 2019-07-11 LAB — CBC
HCT: 25.6 % — ABNORMAL LOW (ref 36.0–46.0)
Hemoglobin: 8.2 g/dL — ABNORMAL LOW (ref 12.0–15.0)
MCH: 37.3 pg — ABNORMAL HIGH (ref 26.0–34.0)
MCHC: 32 g/dL (ref 30.0–36.0)
MCV: 116.4 fL — ABNORMAL HIGH (ref 80.0–100.0)
Platelets: 124 10*3/uL — ABNORMAL LOW (ref 150–400)
RBC: 2.2 MIL/uL — ABNORMAL LOW (ref 3.87–5.11)
RDW: 17.6 % — ABNORMAL HIGH (ref 11.5–15.5)
WBC: 6.9 10*3/uL (ref 4.0–10.5)
nRBC: 0 % (ref 0.0–0.2)

## 2019-07-11 MED ORDER — SENNOSIDES-DOCUSATE SODIUM 8.6-50 MG PO TABS: 1.0000 | ORAL_TABLET | Freq: Two times a day (BID) | ORAL | 0 refills | Status: AC

## 2019-07-11 MED ORDER — ADULT MULTIVITAMIN W/MINERALS CH
1.0000 | ORAL_TABLET | Freq: Every day | ORAL | Status: DC
  Administered 2019-07-11: 1 via ORAL
  Filled 2019-07-11: qty 1

## 2019-07-11 MED ORDER — ENSURE ENLIVE PO LIQD: 237.0000 mL | Freq: Two times a day (BID) | ORAL | Status: DC

## 2019-07-11 MED ORDER — JUVEN PO PACK: 1.0000 | PACK | Freq: Two times a day (BID) | ORAL | 0 refills | Status: AC

## 2019-07-11 MED ORDER — CEFDINIR 300 MG PO CAPS
300.0000 mg | ORAL_CAPSULE | Freq: Two times a day (BID) | ORAL | 0 refills | Status: AC
Start: 2019-07-11 — End: 2019-07-14

## 2019-07-11 MED ORDER — DOCUSATE SODIUM 100 MG PO CAPS: 100.0000 mg | ORAL_CAPSULE | Freq: Two times a day (BID) | ORAL | 0 refills | Status: AC

## 2019-07-11 MED ORDER — FENTANYL 100 MCG/HR TD PT72: 1.0000 | MEDICATED_PATCH | TRANSDERMAL | 0 refills | Status: AC

## 2019-07-11 MED ORDER — ENSURE ENLIVE PO LIQD: 237.0000 mL | Freq: Two times a day (BID) | ORAL | 12 refills | Status: AC

## 2019-07-11 MED ORDER — ADULT MULTIVITAMIN W/MINERALS CH: 1.0000 | ORAL_TABLET | Freq: Every day | ORAL | 1 refills | Status: AC

## 2019-07-11 MED ORDER — JUVEN PO PACK
1.0000 | PACK | Freq: Two times a day (BID) | ORAL | Status: DC
  Filled 2019-07-11 (×2): qty 1

## 2019-07-11 MED ORDER — POLYETHYLENE GLYCOL 3350 17 G PO PACK: 17.0000 g | PACK | Freq: Every day | ORAL | 0 refills | Status: AC

## 2019-07-11 MED ORDER — PANTOPRAZOLE SODIUM 40 MG PO TBEC: 40.0000 mg | DELAYED_RELEASE_TABLET | Freq: Every day | ORAL | 0 refills | Status: DC

## 2019-07-11 MED ORDER — OXYCONTIN 20 MG PO T12A: 20.0000 mg | EXTENDED_RELEASE_TABLET | Freq: Two times a day (BID) | ORAL | 0 refills | Status: AC

## 2019-07-11 NOTE — Discharge Summary (Addendum)
Physician Discharge Summary  Ashley Mayo TDV:[id number] DOB: 11-07-1965 DOA: 07/06/2019  PCP: System, Pcp Not In  Admit date: 07/06/2019 Discharge date: 07/11/2019 Consultations: Pulmonary critical care Admitted From: home Disposition: home  Discharge Diagnoses:  Principal Problem:   Respiratory failure, acute ([place]) Active Problems:   Severe sepsis ([place])   Pneumonia of both lungs due to Streptococcus pneumoniae ([place])   Metastatic lung cancer (metastasis from lung to other site) [facility])   History of pulmonary embolism   Hypokalemia   Anxiety   Depression   Brain metastasis ([place])   Hypomagnesemia   Thrombocytopenia [facility])   Hospital Course Summary: Admit date:07/06/2019 54 year old female with prior pulmonary emboli (on eliquis), depression, GERD, hypertension, asthma, COPD, anxiety, 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  ED Course:  Febrile temp 101F, wbc 12K , Procal nearly 30 c/w severe sepsis and lactic acodisis that resolved. Intubated. She had acute encephalopathy - coma. EEG c/w diffuse encephalopathy that is non-specific.CT with partial filling of left lower lobe bronchus but no atelectasis distally concerning for mucous plug versus tumor. Hospital course:  She was admitted to ICU on 5/13 with sepsis and working diagnosis of multifocal pneumonia with acute encephalopathy and acute hypoxic respiratory failure requiring intubation , IV cefepime/vancomycin. Extubated on 5/16 and transferred to [facility] service 5/18. Remains on abx (day 6/7) and also on fentanyl patch/oxyciodone for chronic pain secondary to  metastatic carcinoma. On RA and saturating well at the time of transfer out of ICU. Patient ambulating and able to use the bathroom with minimal help. Wishes to be discharged today. States son is trained as PCA (personal care assistance) and can help her at home.   Acute hypoxic respiratory failure and severe sepsis due to multifocal pneumococcal PNA: POA. CT with partial filling of left lower lobe bronchus but no atelectasis distally concerning for mucous plug versus tumor.Extubated 5/16, on room air now and saturating well. Recommend repeat CXR in 2-3 weeks for f/u and ensure improvement. As she is insisting to go home and doing fairly well clinically, will transition to oral antibiotics and discharge home. Albuterol prn prescribed as well.   Stage IV adenocarcinoma w/ mets to brain, bones, and liver. Manged by [place] at [facility]. Follow up on discharge and repeat chest imaging as outlined above .Mild coagulopathy noted with INR 1.5 on presenattion: sepsis vs underlying liver mets-repeat INRat 1.2  Chronic Pain secondary to Metastatic Adenocarcinoma: TDD oxycodone (short & long acting) 320mg  per day. Short acting oxycodone held while here. Doing well with fentanyl patch and oxycontin 20 mg Q12 --will d/c on the same and instructed to follow up palliative care for further symptom management. Patient also advised to avoid oversedation to reduce aspiration risk.   Fluid, electrolyte & acid base imbalance: Hypokalemia, NAG metabolic acidosis and hypomagnesemia, hypophosphatemia- replaced as indicated. Resume prior to admission home supplements  Macrocytic anemia-Likely due to chronic illness/malignancy/chemotherapy. Stable around hgb 8.0. No signs of acute bleeding.  Remote history of pulmonary emboli. Off anticoagulation as directed by oncology at [facility]  History of seizures. Felt secondary to her brain metastasis-->the EEG w/ intermittent rhythmic transitions. -continue  keppra  Anxiety/Depression: resume  zyprexa, held Adderral while in ICU due to tachycardia.   Goals of care-Based on prior conversation, patient requested full code. Daughter is newly pregnant and patient is hopeful to live to see grandchild. She has been reportedly told by ONC that this may not be a realistic expectation. She is to follow up with primary oncologist and palliative care team upon discharge for further discussions. Will place [facility] referral   Discharge Exam:  Vitals:   07/11/19 0749 07/11/19 1416  BP:  107/70  Pulse: (!) 108 (!) 110  Resp:  15  Temp:  98.9 F (37.2 C)  SpO2:  90%   Vitals:   07/10/19 2337 07/11/19 0426 07/11/19 0749 07/11/19 1416  BP: 108/70 120/85  107/70  Pulse: (!) 106 (!) 115 (!) 108 (!) 110  Resp: 16 17  15   Temp: 97.9 F (36.6 C) 97.9 F (36.6 C)  98.9 F (37.2 C)  TempSrc: Oral Oral  Oral  SpO2: 92% 96%  90%  Weight:  56.4 kg    Height:        General: Pt is alert, awake, not in acute distress Cardiovascular: RRR, S1/S2 +, no rubs, no gallops Respiratory: CTA bilaterally, no wheezing, no rhonchi Abdominal: Soft, NT, ND, bowel sounds + Extremities: no edema, no cyanosis  Discharge Condition:Stable CODE STATUS: Diet recommendation: Recommendations for Outpatient Follow-up: PCP in 5-7 days, primary Oncologist as scheduled, [facility] or home palliative care f/u, repeat CXR in 3 weeks  Home Health services upon discharge: yes Equipment/Devices upon discharge:    Discharge Instructions:  Discharge Instructions    Call MD for:  difficulty breathing, headache or visual disturbances   Complete by: As directed    Call MD for:  extreme fatigue   Complete by: As directed    Call MD for:  persistant dizziness or light-headedness   Complete by: As directed    Call MD for:  severe uncontrolled pain   Complete by: As directed    Call MD for:  temperature >100.4   Complete by: As directed    Diet - low sodium heart healthy   Complete by:  As directed    Increase activity slowly   Complete by: As directed      Allergies as of 07/11/2019      Reactions   Codeine Hives   Shellfish Allergy Hives      Medication List    STOP taking these medications   Adderall XR 30 MG 24 hr capsule Generic drug: amphetamine-dextroamphetamine   amphetamine-dextroamphetamine 10 MG tablet Commonly known as: ADDERALL   cetirizine 10 MG tablet Commonly known as: ZYRTEC   dexamethasone 2 MG tablet Commonly known as: DECADRON   diphenhydrAMINE 25 MG tablet Commonly known as: BENADRYL   fluconazole 200 MG tablet Commonly known as: DIFLUCAN   ketorolac 10 MG tablet Commonly known as: TORADOL   LORazepam 0.5 MG tablet Commonly known as: ATIVAN   metoprolol succinate 25 MG 24 hr tablet Commonly known as: TOPROL-XL   valACYclovir 500 MG tablet Commonly known as: VALTREX     TAKE these medications   albuterol (2.5 MG/3ML) 0.083% nebulizer solution Commonly known as: PROVENTIL Take 2.5 mg by nebulization every 6 (six) hours as needed for wheezing or shortness of breath.   albuterol 108 (90 Base) MCG/ACT inhaler Commonly known as: VENTOLIN HFA Inhale 1-2 puffs into the lungs every 6 (six) hours as needed for wheezing or shortness of breath.   cefdinir 300 MG capsule Commonly known as: OMNICEF Take  1 capsule (300 mg total) by mouth 2 (two) times daily for 3 days.   docusate sodium 100 MG capsule Commonly known as: COLACE Take 1 capsule (100 mg total) by mouth 2 (two) times daily.   esomeprazole 40 MG capsule Commonly known as: NEXIUM Take 40 mg by mouth daily.   nutrition supplement (JUVEN) Pack Take 1 packet by mouth 2 (two) times daily between meals.   feeding supplement (ENSURE ENLIVE) Liqd Take 237 mLs by mouth 2 (two) times daily between meals. Start taking on: Jul 12, 2019   fentaNYL 100 MCG/HR Commonly known as: [place] 1 patch onto the skin every 3 (three) days. Start taking on: Jul 14, 2019    furosemide 20 MG tablet Commonly known as: LASIX Take 20 mg by mouth daily as needed for fluid.   levETIRAcetam 500 MG tablet Commonly known as: KEPPRA Take 1 tablet (500 mg total) by mouth 2 (two) times daily.   lidocaine 2 % solution Commonly known as: XYLOCAINE Use as directed 15 mLs in the mouth or throat every 6 (six) hours as needed for mouth pain.   lidocaine 5 % Commonly known as: LIDODERM Place 1-2 patches onto the skin daily as needed (for back pain).   Magnesium 200 MG Tabs Take 1 tablet (200 mg total) by mouth daily.   multivitamin with minerals Tabs tablet Take 1 tablet by mouth daily. Start taking on: Jul 12, 2019   nystatin 100000 UNIT/ML suspension Commonly known as: MYCOSTATIN Take 5 mLs (500,000 Units total) by mouth 4 (four) times daily.   OLANZapine 5 MG tablet Commonly known as: ZYPREXA Take 5 mg by mouth 2 (two) times daily.   ondansetron 8 MG tablet Commonly known as: ZOFRAN Take 8 mg by mouth every 8 (eight) hours as needed for nausea or vomiting.   OxyCONTIN 20 mg 12 hr tablet Generic drug: oxyCODONE Take 1 tablet (20 mg total) by mouth every 12 (twelve) hours for 3 days. What changed:   when to take this  Another medication with the same name was removed. Continue taking this medication, and follow the directions you see here.   pantoprazole 40 MG tablet Commonly known as: PROTONIX Take 1 tablet (40 mg total) by mouth at bedtime.   polyethylene glycol 17 g packet Commonly known as: MIRALAX / GLYCOLAX Take 17 g by mouth daily. Start taking on: Jul 12, 2019   potassium chloride 10 MEQ CR capsule Commonly known as: MICRO-K Take 10 mEq by mouth 2 (two) times daily.   senna-docusate 8.6-50 MG tablet Commonly known as: Senokot-S Take 1 tablet by mouth 2 (two) times daily for 20 days.   SUMAtriptan 6 MG/0.5ML Soln injection Commonly known as: IMITREX Inject 6 mg into the skin every 2 (two) hours as needed for migraine.             Durable Medical Equipment  (From admission, onward)         Start     Ordered   07/11/19 1438  For home use only DME Walker rolling  Once    Question Answer Comment  Walker: With 5 Inch Wheels   Patient needs a walker to treat with the following condition Bone cancer ([place])      07/11/19 1438          Allergies  Allergen Reactions  . Codeine Hives  . Shellfish Allergy Hives      The results of significant diagnostics from this hospitalization (including imaging, microbiology, ancillary and  laboratory) are listed below for reference.    Labs: BNP (last 3 results) No results for input(s): BNP in the last 8760 hours. Basic Metabolic Panel: Recent Labs  Lab 07/07/19 0735 07/07/19 0735 07/07/19 1653 07/08/19 0558 07/08/19 1653 07/09/19 1029 07/10/19 0509 07/11/19 0521  NA 136   < >  --  136 137 134* 137 140  K 3.1*   < >  --  2.7* 4.3 3.8 4.6 4.2  CL 109   < >  --  109 106 101 99 98  CO2 19*   < >  --  19* 21* 26 29 31   GLUCOSE 100*   < >  --  119* 101* 113* 106* 99  BUN 15   < >  --  13 11 14 10 10   CREATININE 0.60   < >  --  0.61 0.58 0.52 0.61 0.73  CALCIUM 7.2*   < >  --  8.1* 8.0* 8.5* 9.5 8.9  MG 1.0*  --  2.0 1.6* 1.3* 1.5*  --   --   PHOS 2.0*  --  3.1 2.4* 1.7* 3.3  --   --    < > = values in this interval not displayed.   Liver Function Tests: Recent Labs  Lab 07/06/19 1102 07/08/19 0558 07/09/19 1029  AST 52* 31 26  ALT 25 17 16   ALKPHOS 210* 168* 162*  BILITOT 1.1 0.5 0.4  PROT 7.2 5.5* 5.7*  ALBUMIN 2.7* 2.0* 2.0*   No results for input(s): LIPASE, AMYLASE in the last 168 hours. No results for input(s): AMMONIA in the last 168 hours. CBC: Recent Labs  Lab 07/06/19 1102 07/06/19 1516 07/07/19 0735 07/08/19 0558 07/09/19 1029 07/10/19 0509 07/11/19 0521  WBC 12.0*   < > 8.2 6.8 5.7 6.5 6.9  NEUTROABS 11.3*  --   --   --   --   --   --   HGB 12.1   < > 8.0* 7.5* 8.3* 9.0* 8.2*  HCT 36.8   < > 25.1* 23.7* 26.0* 28.2* 25.6*   MCV 110.8*   < > 115.1* 114.5* 113.0* 113.7* 116.4*  PLT 135*   < > 108* 98* 115* 121* 124*   < > = values in this interval not displayed.   Cardiac Enzymes: No results for input(s): CKTOTAL, CKMB, CKMBINDEX, TROPONINI in the last 168 hours. BNP: Invalid input(s): POCBNP CBG: Recent Labs  Lab 07/10/19 2114 07/10/19 2354 07/11/19 0417 07/11/19 0712 07/11/19 1130  GLUCAP 99 100* 96 88 91   D-Dimer No results for input(s): DDIMER in the last 72 hours. Hgb A1c No results for input(s): HGBA1C in the last 72 hours. Lipid Profile No results for input(s): CHOL, HDL, LDLCALC, TRIG, CHOLHDL, LDLDIRECT in the last 72 hours. Thyroid function studies No results for input(s): TSH, T4TOTAL, T3FREE, THYROIDAB in the last 72 hours.  Invalid input(s): FREET3 Anemia work up No results for input(s): VITAMINB12, FOLATE, FERRITIN, TIBC, IRON, RETICCTPCT in the last 72 hours. Urinalysis    Component Value Date/Time   COLORURINE YELLOW 07/06/2019 1354   APPEARANCEUR CLEAR 07/06/2019 1354   LABSPEC 1.017 07/06/2019 1354   PHURINE 6.0 07/06/2019 1354   GLUCOSEU NEGATIVE 07/06/2019 1354   HGBUR MODERATE (A) 07/06/2019 1354   BILIRUBINUR NEGATIVE 07/06/2019 1354   KETONESUR 80 (A) 07/06/2019 1354   PROTEINUR 100 (A) 07/06/2019 1354   NITRITE NEGATIVE 07/06/2019 1354   LEUKOCYTESUR NEGATIVE 07/06/2019 1354   Sepsis Labs Invalid input(s): PROCALCITONIN,  WBC,  LACTICIDVEN  Microbiology Recent Results (from the past 240 hour(s))  Blood Culture (routine x 2)     Status: None   Collection Time: 07/06/19 11:10 AM   Specimen: BLOOD  Result Value Ref Range Status   Specimen Description   Final    BLOOD BLOOD RIGHT FOREARM Performed at [facility] [address], [place], [place] [address]    Special Requests   Final    BOTTLES DRAWN AEROBIC AND ANAEROBIC Blood Culture results may not be optimal due to an inadequate volume of blood received in culture bottles Performed at  [facility] [address], [place], [place] [address]    Culture   Final    NO GROWTH 5 DAYS Performed at [facility], [place] [address], [place], [place] [address]    Report Status 07/11/2019 FINAL  Final  SARS Coronavirus 2 by RT PCR (hospital order, performed in [facility] hospital lab) Nasopharyngeal Nasopharyngeal Swab     Status: None   Collection Time: 07/06/19 11:10 AM   Specimen: Nasopharyngeal Swab  Result Value Ref Range Status   SARS Coronavirus 2 NEGATIVE NEGATIVE Final    Comment: (NOTE) SARS-CoV-2 target nucleic acids are NOT DETECTED. The SARS-CoV-2 RNA is generally detectable in upper and lower respiratory specimens during the acute phase of infection. The lowest concentration of SARS-CoV-2 viral copies this assay can detect is 250 copies / mL. A negative result does not preclude SARS-CoV-2 infection and should not be used as the sole basis for treatment or other patient management decisions.  A negative result may occur with improper specimen collection / handling, submission of specimen other than nasopharyngeal swab, presence of viral mutation(s) within the areas targeted by this assay, and inadequate number of viral copies (<250 copies / mL). A negative result must be combined with clinical observations, patient history, and epidemiological information. Fact Sheet for Patients:   StrictlyIdeas.no Fact Sheet for Healthcare Providers: BankingDealers.co.za This test is not yet approved or cleared  by the [place] FDA and has been authorized for detection and/or diagnosis of SARS-CoV-2 by FDA under an Emergency Use Authorization (EUA).  This EUA will remain in effect (meaning this test can be used) for the duration of the COVID-19 declaration under Section 564(b)(1) of the Act, 21 U.S.C. section 360bbb-3(b)(1), unless the authorization is terminated or revoked sooner. Performed at [facility], [place] [address], [place], [place] [address]   Blood Culture (routine x 2)     Status: None   Collection Time: 07/06/19 11:15 AM   Specimen: BLOOD  Result Value Ref Range Status   Specimen Description   Final    BLOOD RIGHT ANTECUBITAL Performed at [facility] [address], [place], [place] [address]    Special Requests   Final    BOTTLES DRAWN AEROBIC AND ANAEROBIC Blood Culture adequate volume Performed at [facility] [address] [place], [place] [address]    Culture   Final    NO GROWTH 5 DAYS Performed at [facility], [place] [address] [place], [place] [address]    Report Status 07/11/2019 FINAL  Final  Urine culture     Status: None   Collection Time: 07/06/19  1:54 PM   Specimen: In/Out Cath Urine  Result Value Ref Range Status   Specimen Description   Final    IN/OUT CATH URINE Performed at [facility] [address], [place], [place] [address]  Special Requests   Final    NONE Performed at [facility], [place] [address], [place], [place] [address]    Culture   Final    NO GROWTH Performed at [facility], [place] [address] [place], [place] [address]    Report Status 07/08/2019 FINAL  Final  MRSA PCR Screening     Status: None   Collection Time: 07/06/19  2:30 PM   Specimen: Nasal Mucosa; Nasopharyngeal  Result Value Ref Range Status   MRSA by PCR NEGATIVE NEGATIVE Final    Comment:        The GeneXpert MRSA Assay (FDA approved for NASAL specimens only), is one component of a comprehensive MRSA colonization surveillance program. It is not intended to diagnose MRSA infection nor to guide or monitor treatment for MRSA infections. Performed at [facility], [place] [address], [place], [place] [address]   Culture, respiratory (tracheal aspirate)     Status: None   Collection Time: 07/06/19  5:32 PM   Specimen: Tracheal  Aspirate; Respiratory  Result Value Ref Range Status   Specimen Description   Final    TRACHEAL ASPIRATE Performed at [facility] [address] [place], [place] [address]    Special Requests NONE  Final   Gram Stain   Final    ABUNDANT WBC PRESENT, PREDOMINANTLY PMN ABUNDANT GRAM POSITIVE COCCI    Culture   Final    RARE Consistent with normal respiratory flora. Performed at [facility], [place] [address], [place], [place] [address]    Report Status 07/09/2019 FINAL  Final  Respiratory Panel by PCR     Status: None   Collection Time: 07/06/19  8:02 PM   Specimen: Nasopharyngeal Swab; Respiratory  Result Value Ref Range Status   Adenovirus NOT DETECTED NOT DETECTED Final   Coronavirus 229E NOT DETECTED NOT DETECTED Final    Comment: (NOTE) The Coronavirus on the Respiratory Panel, DOES NOT test for the novel  Coronavirus (2019 nCoV)    Coronavirus HKU1 NOT DETECTED NOT DETECTED Final   Coronavirus NL63 NOT DETECTED NOT DETECTED Final   Coronavirus OC43 NOT DETECTED NOT DETECTED Final   Metapneumovirus NOT DETECTED NOT DETECTED Final   Rhinovirus / Enterovirus NOT DETECTED NOT DETECTED Final   Influenza A NOT DETECTED NOT DETECTED Final   Influenza B NOT DETECTED NOT DETECTED Final   Parainfluenza Virus 1 NOT DETECTED NOT DETECTED Final   Parainfluenza Virus 2 NOT DETECTED NOT DETECTED Final   Parainfluenza Virus 3 NOT DETECTED NOT DETECTED Final   Parainfluenza Virus 4 NOT DETECTED NOT DETECTED Final   Respiratory Syncytial Virus NOT DETECTED NOT DETECTED Final   Bordetella pertussis NOT DETECTED NOT DETECTED Final   Chlamydophila pneumoniae NOT DETECTED NOT DETECTED Final   Mycoplasma pneumoniae NOT DETECTED NOT DETECTED Final    Comment: Performed at [facility], [place] [address] [place], [place] [address]  Culture, respiratory (non-expectorated)     Status: None   Collection Time: 07/09/19 10:04 AM   Specimen: Tracheal Aspirate;  Respiratory  Result Value Ref Range Status   Specimen Description   Final    TRACHEAL ASPIRATE Performed at [facility] [address] [place], [place] [address]    Special Requests   Final    NONE Performed at [facility], [place] [address] [place], [place], [place] [address]    Gram Stain   Final    FEW WBC PRESENT, PREDOMINANTLY  PMN NO ORGANISMS SEEN    Culture   Final    NO GROWTH 2 DAYS Performed at [facility] [address] [place], [place] [address]    Report Status 07/11/2019 FINAL  Final    Procedures/Studies: DG Abdomen 1 View  Result Date: 07/06/2019 CLINICAL DATA:  Check gastric catheter placement EXAM: ABDOMEN - 1 VIEW COMPARISON:  None. FINDINGS: Gastric catheter is noted within the mid stomach. Stable bilateral pulmonary infiltrates are seen. Scattered large and small bowel gas is noted. IMPRESSION: Gastric catheter within the stomach. Electronically Signed   By: Inez Catalina M.D.   On: 07/06/2019 15:01   CT CHEST WO CONTRAST  Result Date: 07/07/2019 CLINICAL DATA:  Inpatient. Reported history of lung cancer and pneumococcal pneumonia. EXAM: CT CHEST WITHOUT CONTRAST TECHNIQUE: Multidetector CT imaging of the chest was performed following the standard protocol without IV contrast. COMPARISON:  Chest radiograph from earlier today. 12/30/2017 chest CT angiogram. FINDINGS: Cardiovascular: Mild cardiomegaly. No significant pericardial effusion/thickening. Three-vessel coronary atherosclerosis. Normal course and caliber of the thoracic aorta. Normal caliber pulmonary arteries. Mediastinum/Nodes: No discrete thyroid nodules. Enteric tube terminates in the body of the stomach. No axillary or mediastinal adenopathy. Suggestion of bilateral hilar adenopathy, poorly delineated on this noncontrast scan. Lungs/Pleura: No pneumothorax. Trace dependent bilateral pleural effusions. Endotracheal tube tip is 1.7 cm above the carina. Left lower lobe  bronchus is occluded. Moderate centrilobular and paraseptal emphysema. Extensive patchy consolidation and surrounding ground-glass opacity throughout both lungs involving all lung lobes, most prominent in the central left lower lobe, where the consolidation appears somewhat irregular and masslike although ill-defined. No significant discrete pulmonary nodules. Upper abdomen: No acute abnormality. Musculoskeletal: Widespread lytic osseous metastases throughout the thoracic vertebral bodies, most prominent at T3, T4 and T8 with moderate pathologic vertebral fracture at T8. Mixed lytic and sclerotic manubrial metastasis, mildly expansile. Expansile lytic anterior left first rib, anterior left third rib and lateral left fifth rib metastases. IMPRESSION: 1. Extensive patchy consolidation and surrounding ground-glass opacity throughout both lungs involving all lung lobes, most prominent in the central left lower lobe, where the consolidation appears somewhat irregular and masslike although ill-defined. Occluded left lower lobe bronchus. Findings are compatible with multilobar pneumonia with suspected central left lower lobe neoplasm. 2. Suggestion of bilateral hilar adenopathy, poorly delineated on this noncontrast scan. 3. Widespread lytic osseous metastases throughout the thoracic skeleton as detailed. Moderate pathologic vertebral fracture at T8. 4. Trace dependent bilateral pleural effusions. 5. Endotracheal tube tip is 1.7 cm above the carina. Enteric tube terminates in the body of the stomach. 6. Three-vessel coronary atherosclerosis. 7. Aortic Atherosclerosis (ICD10-I70.0) and Emphysema (ICD10-J43.9). Electronically Signed   By: Ilona Sorrel M.D.   On: 07/07/2019 16:51   DG Chest Port 1 View  Result Date: 07/08/2019 CLINICAL DATA:  Pneumonia EXAM: PORTABLE CHEST 1 VIEW COMPARISON:  Chest x-rays dated 07/07/2019 and 07/06/2019. FINDINGS: Endotracheal tube is well positioned with tip just above the level the  carina. Enteric tube passes below the diaphragm. Bilateral airspace opacities are not significantly changed compared to the recent exams. No new lung findings. No pleural effusion or pneumothorax is seen. IMPRESSION: 1. Stable bilateral airspace opacities, compatible with multifocal pneumonia. 2. Endotracheal tube well positioned with tip just above the level of the carina. Electronically Signed   By: Franki Cabot M.D.   On: 07/08/2019 05:13   DG Chest Port 1 View  Result Date: 07/07/2019 CLINICAL DATA:  Adenocarcinoma of the lung. EXAM: PORTABLE CHEST 1 VIEW  COMPARISON:  Jul 06, 2019. FINDINGS: Stable cardiomediastinal silhouette. Endotracheal and nasogastric tubes are in grossly good position. No pneumothorax or pleural effusion is noted. Grossly stable bilateral lung opacities are noted concerning for pneumonia. Bony thorax is unremarkable. IMPRESSION: Stable bilateral lung opacities are noted concerning for pneumonia. Endotracheal and nasogastric tubes are in grossly good position. Electronically Signed   By: Marijo Conception M.D.   On: 07/07/2019 11:37   DG Chest Portable 1 View  Result Date: 07/06/2019 CLINICAL DATA:  Endotracheal placement EXAM: PORTABLE CHEST 1 VIEW COMPARISON:  07/06/2019 FINDINGS: Endotracheal tube tip is 3.5 cm above the carina. Widespread bilateral pulmonary infiltrates as seen previously. IMPRESSION: Endotracheal tube tip 3.5 cm above the carina. Electronically Signed   By: Nelson Chimes M.D.   On: 07/06/2019 13:05   DG Chest Port 1 View  Result Date: 07/06/2019 CLINICAL DATA:  Sepsis.  Hypoxia. EXAM: PORTABLE CHEST 1 VIEW COMPARISON:  Chest x-ray dated January 27, 2019. FINDINGS: The heart size and mediastinal contours are within normal limits. Normal pulmonary vascularity. The lungs remain hyperinflated with emphysematous changes and scattered interstitial coarsening. New patchy consolidation in the lingula, right middle lobe, and right upper lobe. No pleural effusion or  pneumothorax. No acute osseous abnormality. IMPRESSION: 1. New multifocal pneumonia. 2. COPD. Electronically Signed   By: Titus Dubin M.D.   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 Level of alertness:  Comatose AEDs during EEG study: keppra, versed Technical aspects: This EEG study was done with scalp electrodes positioned according to the 10-20 International system of electrode placement. Electrical activity was acquired at a sampling rate of 500Hz  and reviewed with a high frequency filter of 70Hz  and a low frequency filter of 1Hz . EEG data were recorded continuously and digitally stored. Description:  EEG showed continuous generalized polymorphic 3 to 6 Hz theta-delta slowing. Sharp transients were also seen in left parieto-occipital region which at times appear rhythmic.  Hyperventilation and photic stimulation were not performed.   ABNORMALITY -Continuous slow, generalized IMPRESSION: This study is suggestive of severe diffuse encephalopathy, nonspecific etiology. Sharp transients were seen in left parieto-occipital region, at times rhythmic with low likelihood of potential epileptogenicity. If suspicion for interictal activity remains a concern, a prolonged study should be considered. Ordering provider was notified. Lora Havens    Time coordinating discharge: Over 30 minutes  SIGNED:   Guilford Shi, MD  [facility] 07/11/2019, 2:41 PM

## 2019-07-11 NOTE — Progress Notes (Signed)
NT found 2 pills slip out of patient's pocket on the way to the bathroom. I looked them up and one was adderall 10 mg, and the other one was 80 mg of oxycontin. Pharmacy was notified. I will speak to patient about medications and reiterate that she is not to be taking her own medications. I will send all medications down to pharmacy that patient has at bedside.Roderick Pee

## 2019-07-11 NOTE — Progress Notes (Signed)
Patient's elevated HR triggered yellow MEWS score again. MD, Charge, and RR RN already aware. Patient continues to be stable. Will continue to monitor.Roderick Pee   07/11/19 0426  Assess: MEWS Score  Temp 97.9 F (36.6 C)  BP 120/85  Pulse Rate (!) 115  Resp 17  SpO2 96 %  O2 Device Room Air  Assess: MEWS Score  MEWS Temp 0  MEWS Systolic 0  MEWS Pulse 2  MEWS RR 0  MEWS LOC 0  MEWS Score 2  MEWS Score Color Yellow  Assess: if the MEWS score is Yellow or Red  Were vital signs taken at a resting state? Yes  Focused Assessment Documented focused assessment  Early Detection of Sepsis Score *See Row Information* Low  MEWS guidelines implemented *See Row Information* No, previously yellow, continue vital signs every 4 hours  Treat  MEWS Interventions Administered scheduled meds/treatments;Administered prn meds/treatments  Take Vital Signs  Increase Vital Sign Frequency  Yellow: Q 2hr X 2 then Q 4hr X 2, if remains yellow, continue Q 4hrs  Escalate  MEWS: Escalate  (N/a; not new.)  Notify: Charge Nurse/RN  Name of Charge Nurse/RN Notified Stephanie  Date Charge Nurse/RN Notified 07/11/19  Time Charge Nurse/RN Notified 0430  Notify: Provider  Provider Name/Title  (N/A; Day MD aware of elevated pulse. )  Notify: Rapid Response  Name of Rapid Response RN Notified Johnson,RN  Date Rapid Response Notified 07/11/19  Time Rapid Response Notified [id number]  Document  Patient Outcome Other (Comment) (n/a stable, only HR slightly elevated)  Progress note created (see row info) Yes

## 2019-07-11 NOTE — Progress Notes (Signed)
Nutrition Follow-up  INTERVENTION:   -Ensure Enlive po BID, each supplement provides 350 kcal and 20 grams of protein -Juven Fruit Punch BID, each serving provides 95kcal and 2.5g of protein (amino acids glutamine and arginine) -Multivitamin with minerals daily  NUTRITION DIAGNOSIS:   Increased nutrient needs related to acute illness, chronic illness, cancer and cancer related treatments as evidenced by estimated needs.  Ongoing.  GOAL:   Patient will meet greater than or equal to 90% of their needs  Progressing.  MONITOR:   Supplement acceptance, PO intake, Labs, Weight trends, I & O's, Skin  ASSESSMENT:   54 year old female with medical history of stage IV lung cancer with mets to brain, bone (including spine), and liver. She is s/p chemo and radiation and has been on maintenance therapy every 4 weeks. She was admitted on 5/13 with sepsis d/t PNA. She was started on BiPAP but failed and subsequently intubated in the ED.  5/13: admitted, intubated 5/14: TF initiated via OGT 5/16: extubated, TF stopped 5/17: diet advanced to regular   Patient now on regular diet, consuming ~50% of meals. Would benefit from nutritional supplements, increased needs d/t chemo/radiation for metastatic lung cancer. Will order Ensure and Juven supplements.  Admission weight: 112 lbs. Current weight: 124 lbs.  I/Os: +164 ml since admit UOP: 825 ml x 24 hrs  Medications: Colace, Magic Mouthwash, Miralax, Senokot  Labs reviewed: CBGs: 88-96  Diet Order:   Diet Order            Diet regular Room service appropriate? Yes; Fluid consistency: Thin  Diet effective now              EDUCATION NEEDS:   No education needs have been identified at this time  Skin:  Skin Assessment: Skin Integrity Issues: Skin Integrity Issues:: Stage I Stage I: R upper shoulder, mid-upper face  Last BM:  5/17  Height:   Ht Readings from Last 1 Encounters:  07/09/19 5\' 4"  (1.626 m)    Weight:   Wt  Readings from Last 1 Encounters:  07/11/19 56.4 kg   BMI:  Body mass index is 21.34 kg/m.  Estimated Nutritional Needs:   Kcal:  1650-1850  Protein:  75-90g  Fluid:  1.8L/day  Clayton Bibles, MS, RD, LDN Inpatient Clinical Dietitian Contact information available via Amion

## 2019-07-11 NOTE — Progress Notes (Signed)
Patient states she has no more medications at bedside.  The 2 pills that were found were in her clothes from home, and she reports forgetting they were in there. Patient was educated and reminded how dangerous it would be taking medications from home, in addition to medications provided from the hospital. Patient agreed and was fine with me taking those 2 pills down to pharmacy. Will continue to monitor patient.Roderick Pee

## 2019-07-11 NOTE — Progress Notes (Signed)
PT Cancellation Note  Patient Details Name: Ashley Mayo MRN: [id number] DOB: May 29, 1965   Cancelled Treatment:     Noted NEW PT order, pt for DC today and ambulating without assistance. No PT needs at this time.     Clide Dales 07/11/2019, 3:49 PM

## 2019-07-11 NOTE — TOC Transition Note (Signed)
Transition of Care [facility]) - CM/SW Discharge Note   Patient Details  Name: Ashley Mayo MRN: [id number] Date of Birth: 1965-12-05  [facility] CM/SW Contact:  Lynnell Catalan, RN Phone Number: 07/11/2019, 2:49 PM   Clinical Narrative:     This CM witnessed pt ambulate to bathroom without assistance. Pt did request RW for home. RW ordered and [employer] liaison contacted. Pt declines home health services at this time. She states that her son helps her at home and that she has applied for [facility] services.

## 2019-07-11 NOTE — Progress Notes (Signed)
Pt dc to home w ride from son and hospital wheelchair escort  pivs removed, avs printed and discussed, paper script given and two pills from pharmacy returned to patient  Unable to retreive pt belongings, provided w number to pt relations according to [facility]  No further questions at time of dc

## 2019-11-11 ENCOUNTER — Other Ambulatory Visit: Payer: Self-pay

## 2019-11-11 ENCOUNTER — Encounter (HOSPITAL_COMMUNITY): Payer: Self-pay | Admitting: Internal Medicine

## 2019-11-11 ENCOUNTER — Emergency Department (HOSPITAL_COMMUNITY): Payer: Medicaid Other

## 2019-11-11 ENCOUNTER — Inpatient Hospital Stay (HOSPITAL_COMMUNITY)
Admission: EM | Admit: 2019-11-11 | Discharge: 2019-11-14 | DRG: 190 | Disposition: A | Discharge status: Home / Self Care | Payer: Medicaid Other | Attending: Internal Medicine | Admitting: Internal Medicine

## 2019-11-11 DIAGNOSIS — R0902 Hypoxemia: Secondary | ICD-10-CM | POA: Diagnosis present

## 2019-11-11 DIAGNOSIS — J9600 Acute respiratory failure, unspecified whether with hypoxia or hypercapnia: Secondary | ICD-10-CM | POA: Diagnosis present

## 2019-11-11 DIAGNOSIS — C7951 Secondary malignant neoplasm of bone: Secondary | ICD-10-CM | POA: Diagnosis present

## 2019-11-11 DIAGNOSIS — J209 Acute bronchitis, unspecified: Secondary | ICD-10-CM | POA: Diagnosis present

## 2019-11-11 DIAGNOSIS — D649 Anemia, unspecified: Secondary | ICD-10-CM

## 2019-11-11 DIAGNOSIS — J40 Bronchitis, not specified as acute or chronic: Secondary | ICD-10-CM | POA: Diagnosis present

## 2019-11-11 DIAGNOSIS — Z79899 Other long term (current) drug therapy: Secondary | ICD-10-CM

## 2019-11-11 DIAGNOSIS — C3490 Malignant neoplasm of unspecified part of unspecified bronchus or lung: Secondary | ICD-10-CM | POA: Diagnosis present

## 2019-11-11 DIAGNOSIS — C779 Secondary and unspecified malignant neoplasm of lymph node, unspecified: Secondary | ICD-10-CM | POA: Diagnosis present

## 2019-11-11 DIAGNOSIS — T451X5A Adverse effect of antineoplastic and immunosuppressive drugs, initial encounter: Secondary | ICD-10-CM | POA: Diagnosis present

## 2019-11-11 DIAGNOSIS — S22060A Wedge compression fracture of T7-T8 vertebra, initial encounter for closed fracture: Secondary | ICD-10-CM | POA: Diagnosis present

## 2019-11-11 DIAGNOSIS — F32A Depression, unspecified: Secondary | ICD-10-CM | POA: Diagnosis present

## 2019-11-11 DIAGNOSIS — Z7901 Long term (current) use of anticoagulants: Secondary | ICD-10-CM

## 2019-11-11 DIAGNOSIS — K219 Gastro-esophageal reflux disease without esophagitis: Secondary | ICD-10-CM | POA: Diagnosis present

## 2019-11-11 DIAGNOSIS — M549 Dorsalgia, unspecified: Secondary | ICD-10-CM | POA: Diagnosis present

## 2019-11-11 DIAGNOSIS — Z87891 Personal history of nicotine dependence: Secondary | ICD-10-CM

## 2019-11-11 DIAGNOSIS — R008 Other abnormalities of heart beat: Secondary | ICD-10-CM | POA: Diagnosis present

## 2019-11-11 DIAGNOSIS — D6481 Anemia due to antineoplastic chemotherapy: Secondary | ICD-10-CM | POA: Diagnosis present

## 2019-11-11 DIAGNOSIS — M4854XA Collapsed vertebra, not elsewhere classified, thoracic region, initial encounter for fracture: Secondary | ICD-10-CM | POA: Diagnosis present

## 2019-11-11 DIAGNOSIS — Z803 Family history of malignant neoplasm of breast: Secondary | ICD-10-CM

## 2019-11-11 DIAGNOSIS — J440 Chronic obstructive pulmonary disease with acute lower respiratory infection: Principal | ICD-10-CM | POA: Diagnosis present

## 2019-11-11 DIAGNOSIS — C7931 Secondary malignant neoplasm of brain: Secondary | ICD-10-CM | POA: Diagnosis present

## 2019-11-11 DIAGNOSIS — D849 Immunodeficiency, unspecified: Secondary | ICD-10-CM | POA: Diagnosis present

## 2019-11-11 DIAGNOSIS — Z885 Allergy status to narcotic agent status: Secondary | ICD-10-CM

## 2019-11-11 DIAGNOSIS — Z7952 Long term (current) use of systemic steroids: Secondary | ICD-10-CM

## 2019-11-11 DIAGNOSIS — J441 Chronic obstructive pulmonary disease with (acute) exacerbation: Secondary | ICD-10-CM

## 2019-11-11 DIAGNOSIS — F329 Major depressive disorder, single episode, unspecified: Secondary | ICD-10-CM | POA: Diagnosis present

## 2019-11-11 DIAGNOSIS — I10 Essential (primary) hypertension: Secondary | ICD-10-CM | POA: Diagnosis present

## 2019-11-11 DIAGNOSIS — J9601 Acute respiratory failure with hypoxia: Secondary | ICD-10-CM | POA: Diagnosis present

## 2019-11-11 DIAGNOSIS — Z79891 Long term (current) use of opiate analgesic: Secondary | ICD-10-CM

## 2019-11-11 DIAGNOSIS — J189 Pneumonia, unspecified organism: Secondary | ICD-10-CM | POA: Diagnosis present

## 2019-11-11 DIAGNOSIS — G893 Neoplasm related pain (acute) (chronic): Secondary | ICD-10-CM | POA: Diagnosis present

## 2019-11-11 DIAGNOSIS — Z91013 Allergy to seafood: Secondary | ICD-10-CM

## 2019-11-11 DIAGNOSIS — Z90710 Acquired absence of both cervix and uterus: Secondary | ICD-10-CM

## 2019-11-11 DIAGNOSIS — R0602 Shortness of breath: Secondary | ICD-10-CM

## 2019-11-11 DIAGNOSIS — I493 Ventricular premature depolarization: Secondary | ICD-10-CM | POA: Diagnosis present

## 2019-11-11 DIAGNOSIS — F419 Anxiety disorder, unspecified: Secondary | ICD-10-CM | POA: Diagnosis present

## 2019-11-11 DIAGNOSIS — R000 Tachycardia, unspecified: Secondary | ICD-10-CM

## 2019-11-11 DIAGNOSIS — G43909 Migraine, unspecified, not intractable, without status migrainosus: Secondary | ICD-10-CM | POA: Diagnosis present

## 2019-11-11 DIAGNOSIS — Z20822 Contact with and (suspected) exposure to covid-19: Secondary | ICD-10-CM | POA: Diagnosis present

## 2019-11-11 LAB — BASIC METABOLIC PANEL
Anion gap: 14 (ref 5–15)
BUN: 29 mg/dL — ABNORMAL HIGH (ref 6–20)
CO2: 30 mmol/L (ref 22–32)
Calcium: 9.6 mg/dL (ref 8.9–10.3)
Chloride: 94 mmol/L — ABNORMAL LOW (ref 98–111)
Creatinine, Ser: 0.82 mg/dL (ref 0.44–1.00)
GFR calc Af Amer: >60 mL/min (ref >60)
GFR calc non Af Amer: >60 mL/min (ref >60)
Glucose, Bld: 145 mg/dL — ABNORMAL HIGH (ref 70–99)
Potassium: 5 mmol/L (ref 3.5–5.1)
Sodium: 138 mmol/L (ref 135–145)

## 2019-11-11 LAB — CBC WITH DIFFERENTIAL/PLATELET
Abs Immature Granulocytes: 0.04 10*3/uL (ref 0.00–0.07)
Basophils Absolute: 0 10*3/uL (ref 0.0–0.1)
Basophils Relative: 0 %
Eosinophils Absolute: 0 10*3/uL (ref 0.0–0.5)
Eosinophils Relative: 0 %
HCT: 31.8 % — ABNORMAL LOW (ref 36.0–46.0)
Hemoglobin: 9.9 g/dL — ABNORMAL LOW (ref 12.0–15.0)
Immature Granulocytes: 1 %
Lymphocytes Relative: 2 %
Lymphs Abs: 0.1 10*3/uL — ABNORMAL LOW (ref 0.7–4.0)
MCH: 31.7 pg (ref 26.0–34.0)
MCHC: 31.1 g/dL (ref 30.0–36.0)
MCV: 101.9 fL — ABNORMAL HIGH (ref 80.0–100.0)
Monocytes Absolute: 0.2 10*3/uL (ref 0.1–1.0)
Monocytes Relative: 2 %
Neutro Abs: 7.6 10*3/uL (ref 1.7–7.7)
Neutrophils Relative %: 95 %
Platelets: 221 10*3/uL (ref 150–400)
RBC: 3.12 MIL/uL — ABNORMAL LOW (ref 3.87–5.11)
RDW: 16.9 % — ABNORMAL HIGH (ref 11.5–15.5)
WBC: 7.9 10*3/uL (ref 4.0–10.5)
nRBC: 0.4 % — ABNORMAL HIGH (ref 0.0–0.2)

## 2019-11-11 LAB — LACTIC ACID, PLASMA
Lactic Acid, Venous: 1.4 mmol/L (ref 0.5–1.9)
Lactic Acid, Venous: 1.6 mmol/L (ref 0.5–1.9)

## 2019-11-11 LAB — TROPONIN I (HIGH SENSITIVITY)
Troponin I (High Sensitivity): 6 ng/L (ref <18)
Troponin I (High Sensitivity): 6 ng/L (ref <18)

## 2019-11-11 LAB — SARS CORONAVIRUS 2 BY RT PCR (HOSPITAL ORDER, PERFORMED IN ~~LOC~~ HOSPITAL LAB): SARS Coronavirus 2: NEGATIVE

## 2019-11-11 IMAGING — CT CT ANGIO CHEST
2 of 6 series · 18 of 36 positions shown · IV contrast (OMNIPAQUE 350)
Comparison: CT chest, [DATE]

CLINICAL DATA: PE suspected, shortness of breath, tachycardia,
history of metastatic cancer

EXAM:
CT ANGIOGRAPHY CHEST WITH CONTRAST
TECHNIQUE: Multidetector CT imaging of the chest was performed using the
standard protocol during bolus administration of intravenous
contrast. Multiplanar CT image reconstructions and MIPs were
obtained to evaluate the vascular anatomy.
CONTRAST:  75mL OMNIPAQUE IOHEXOL 350 MG/ML SOLN

[Series 5: thins · axial · 0.65mm/px · z∈[+1308,+1526]mm · 17 of 246 slices shown]
[im 14/246  lung]
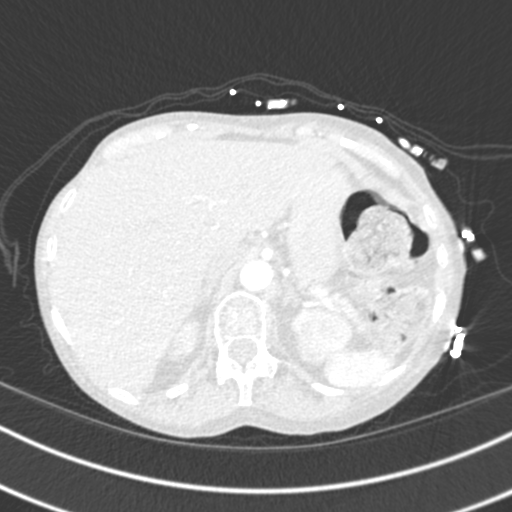
[im 28/246  mediastinal]
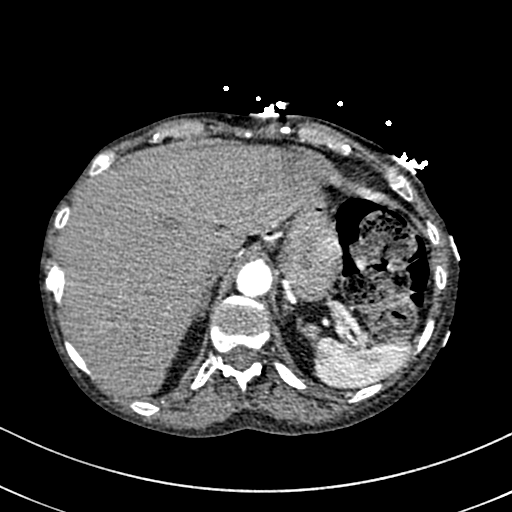
[im 41/246  lung]
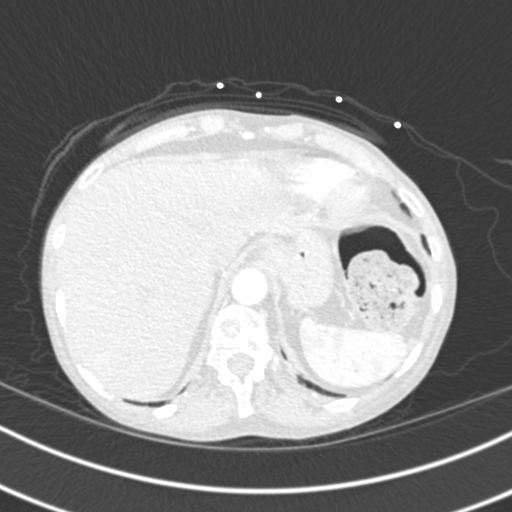
[im 55/246  mediastinal]
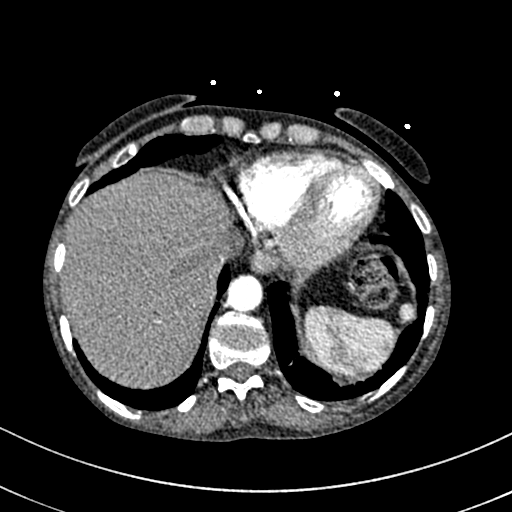
[im 69/246  lung]
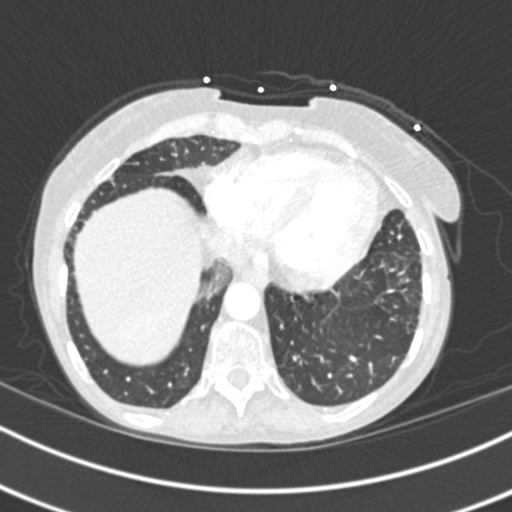
[im 82/246  mediastinal]
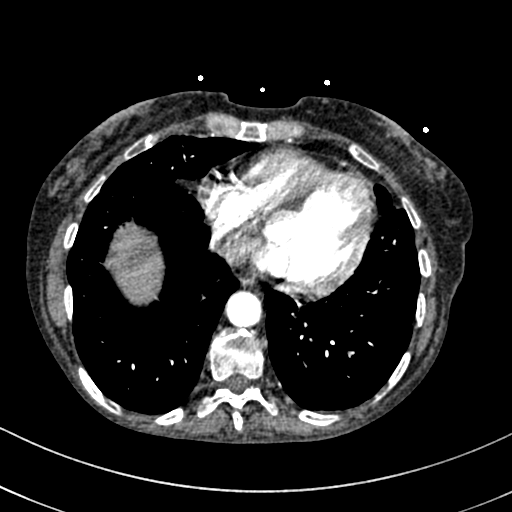
[im 96/246  lung]
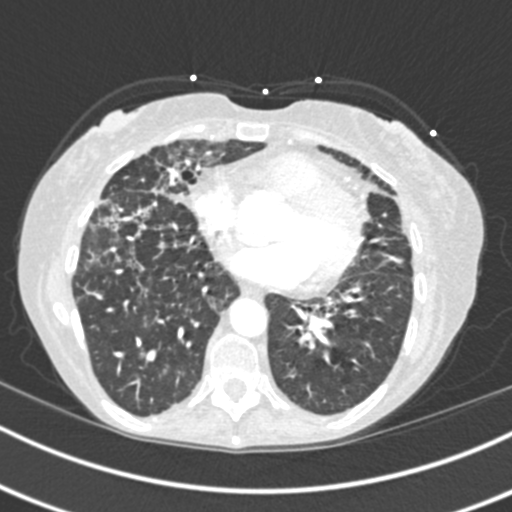
[im 109/246  mediastinal]
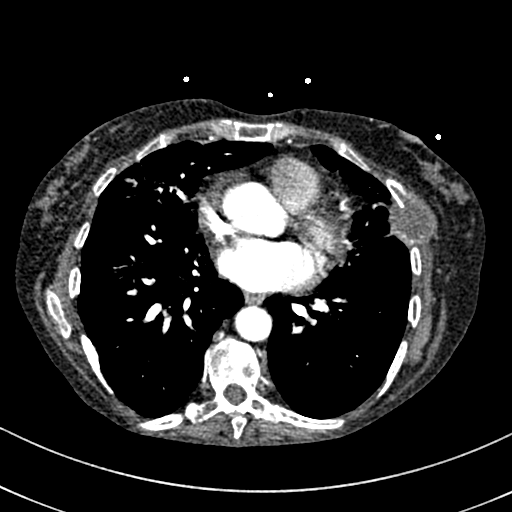
[im 123/246  lung]
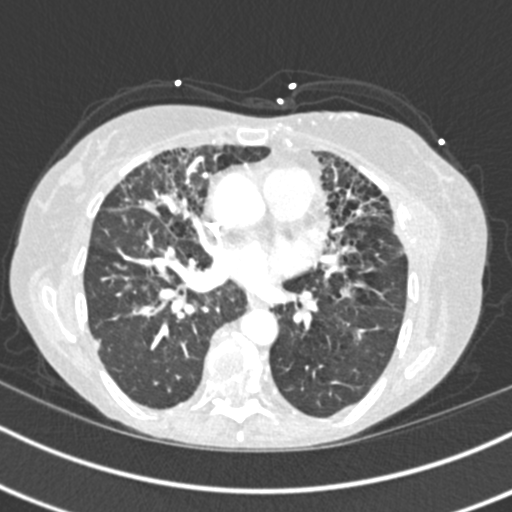
[im 137/246  mediastinal]
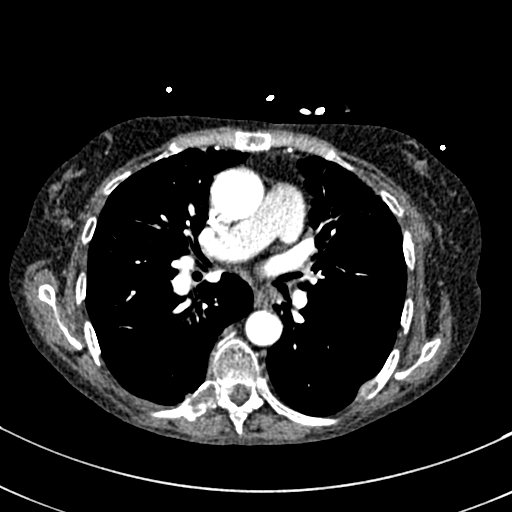
[im 150/246  lung]
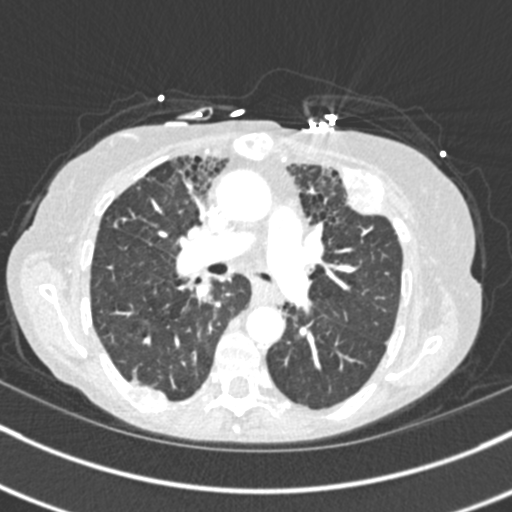
[im 164/246  mediastinal]
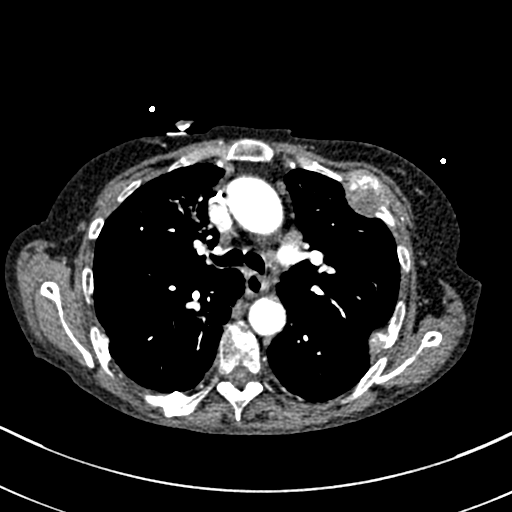
[im 177/246  lung]
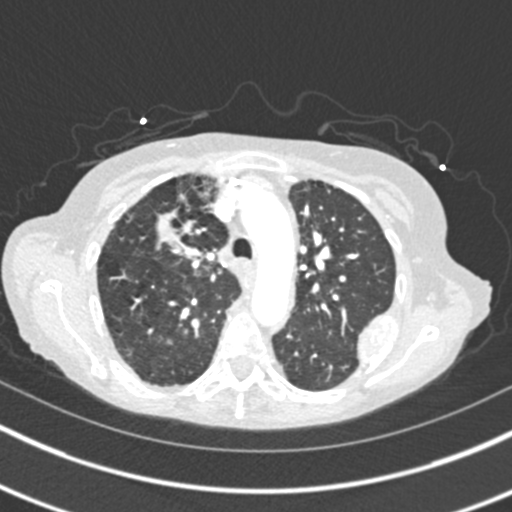
[im 191/246  mediastinal]
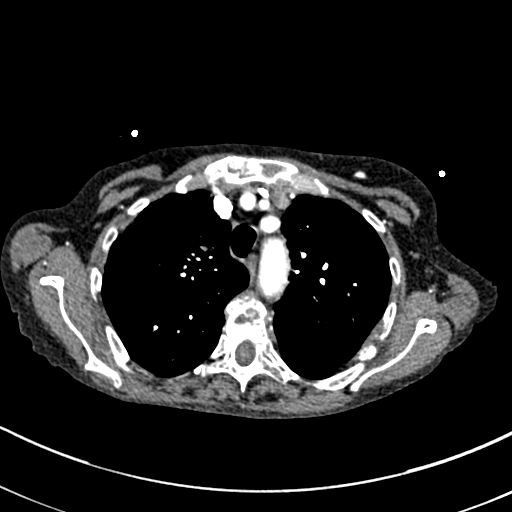
[im 205/246  lung]
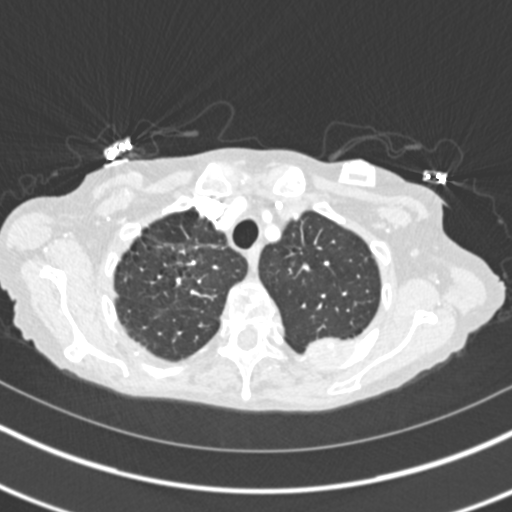
[im 218/246  mediastinal]
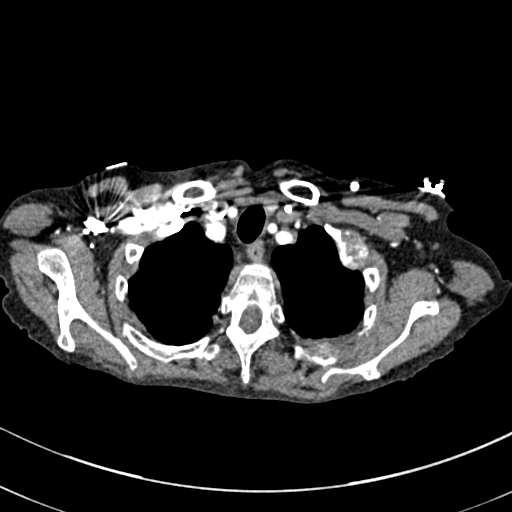
[im 232/246  lung]
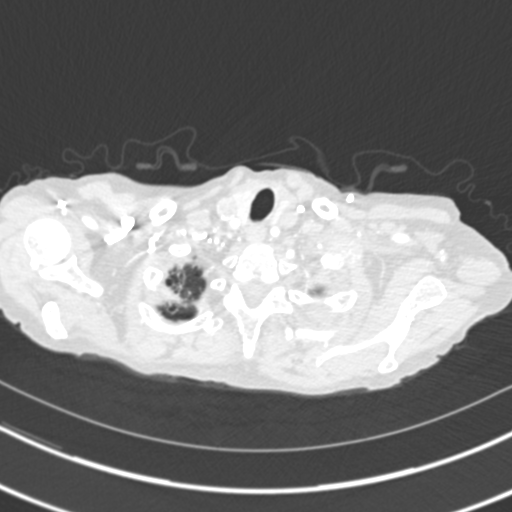

[Series 7: coronal mpr · coronal · 0.50mm/px · 1 of 113 slices shown]
[im 57/113  mediastinal]
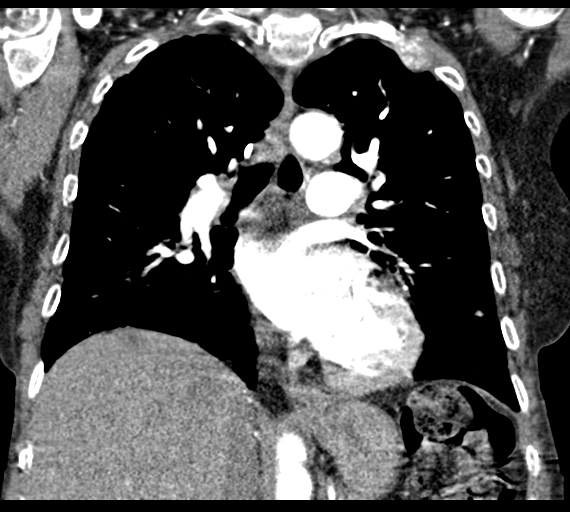

[18 of 36 positions shown; findings below may reference images not displayed]

FINDINGS: Cardiovascular: Satisfactory opacification of the pulmonary arteries
to the segmental level. No evidence of pulmonary embolism. Normal
heart size. Coronary artery calcifications. No pericardial effusion.
Left chest port catheter.

Mediastinum/Nodes: Redemonstrated enlarged mediastinal lymph nodes.
Thyroid gland, trachea, and esophagus demonstrate no significant
findings.

Lungs/Pleura: Scattered ground-glass opacities throughout the lungs.
Unchanged nodule of the medial right upper lobe measuring 8 mm
(series 6, image 30). Fibrotic scarring of the medial right upper
lobe and lingula. No pleural effusion or pneumothorax.

Upper Abdomen: No acute abnormality.

Musculoskeletal: No chest wall abnormality. Numerous lytic and
expansile osseous lesions, generally worsened compared to prior
examination with increasing soft tissue components. There is a new
high-grade wedge deformity of the T8 vertebral body with a
substantial posterior soft tissue component intruding into the
thecal space (series 8, image 70).

Review of the MIP images confirms the above findings.
IMPRESSION: 1. Negative examination for pulmonary embolism.
2. Scattered ground-glass opacities throughout the lungs,
nonspecific and infectious or inflammatory.
3. Widespread osseous metastatic disease as seen on prior
examination, generally worsened.
4. There is a new high-grade wedge deformity of the T8 vertebral
body with a substantial posterior soft tissue component intruding
into the thecal space. Correlate for referable symptoms. MRI of the
thoracic spine may be used to further evaluate cord compression if
desired.
5. Enlarged mediastinal lymph nodes, similar to prior examination.
6. Coronary artery disease.

## 2019-11-11 MED ORDER — MAGNESIUM OXIDE 400 (241.3 MG) MG PO TABS
200.0000 mg | ORAL_TABLET | Freq: Every day | ORAL | Status: DC
  Administered 2019-11-12 – 2019-11-14 (×3): 200 mg via ORAL
  Filled 2019-11-11 (×3): qty 1

## 2019-11-11 MED ORDER — POLYETHYLENE GLYCOL 3350 17 G PO PACK
17.0000 g | PACK | Freq: Every day | ORAL | Status: DC
  Administered 2019-11-12 – 2019-11-14 (×2): 17 g via ORAL
  Filled 2019-11-11 (×3): qty 1

## 2019-11-11 MED ORDER — IPRATROPIUM-ALBUTEROL 0.5-2.5 (3) MG/3ML IN SOLN
3.0000 mL | RESPIRATORY_TRACT | Status: AC
  Administered 2019-11-11 (×3): 3 mL via RESPIRATORY_TRACT
  Filled 2019-11-11: qty 3
  Filled 2019-11-11: qty 6

## 2019-11-11 MED ORDER — DOCUSATE SODIUM 100 MG PO CAPS
100.0000 mg | ORAL_CAPSULE | Freq: Two times a day (BID) | ORAL | Status: DC
  Administered 2019-11-13 – 2019-11-14 (×2): 100 mg via ORAL
  Filled 2019-11-11 (×5): qty 1

## 2019-11-11 MED ORDER — LIDOCAINE 5 % EX PTCH
1.0000 | MEDICATED_PATCH | Freq: Every day | CUTANEOUS | Status: DC | PRN
  Filled 2019-11-11: qty 1

## 2019-11-11 MED ORDER — LEVETIRACETAM 500 MG PO TABS
500.0000 mg | ORAL_TABLET | Freq: Two times a day (BID) | ORAL | Status: DC
  Administered 2019-11-12 – 2019-11-14 (×5): 500 mg via ORAL
  Filled 2019-11-11 (×5): qty 1

## 2019-11-11 MED ORDER — ENSURE ENLIVE PO LIQD
237.0000 mL | Freq: Two times a day (BID) | ORAL | Status: DC
  Administered 2019-11-12 – 2019-11-14 (×6): 237 mL via ORAL

## 2019-11-11 MED ORDER — ALBUTEROL SULFATE HFA 108 (90 BASE) MCG/ACT IN AERS: 1.0000 | INHALATION_SPRAY | Freq: Four times a day (QID) | RESPIRATORY_TRACT | Status: DC | PRN

## 2019-11-11 MED ORDER — PANTOPRAZOLE SODIUM 40 MG PO TBEC
80.0000 mg | DELAYED_RELEASE_TABLET | Freq: Every day | ORAL | Status: DC
  Administered 2019-11-12 – 2019-11-14 (×3): 80 mg via ORAL
  Filled 2019-11-11 (×3): qty 2

## 2019-11-11 MED ORDER — OLANZAPINE 5 MG PO TABS
5.0000 mg | ORAL_TABLET | Freq: Two times a day (BID) | ORAL | Status: DC
  Administered 2019-11-12 – 2019-11-14 (×5): 5 mg via ORAL
  Filled 2019-11-11 (×6): qty 1

## 2019-11-11 MED ORDER — NYSTATIN 100000 UNIT/ML MT SUSP
500000.0000 [IU] | Freq: Four times a day (QID) | OROMUCOSAL | Status: DC
  Administered 2019-11-12 – 2019-11-14 (×10): 500000 [IU] via ORAL
  Filled 2019-11-11 (×9): qty 5

## 2019-11-11 MED ORDER — FUROSEMIDE 40 MG PO TABS: 20.0000 mg | ORAL_TABLET | Freq: Every day | ORAL | Status: DC | PRN

## 2019-11-11 MED ORDER — DEXAMETHASONE SODIUM PHOSPHATE 10 MG/ML IJ SOLN
10.0000 mg | Freq: Once | INTRAMUSCULAR | Status: AC
  Administered 2019-11-11: 10 mg via INTRAVENOUS
  Filled 2019-11-11: qty 1

## 2019-11-11 MED ORDER — LEVOFLOXACIN IN D5W 750 MG/150ML IV SOLN
750.0000 mg | INTRAVENOUS | Status: DC
  Administered 2019-11-11 – 2019-11-12 (×2): 750 mg via INTRAVENOUS
  Filled 2019-11-11 (×2): qty 150

## 2019-11-11 MED ORDER — ONDANSETRON HCL 4 MG PO TABS: 8.0000 mg | ORAL_TABLET | Freq: Three times a day (TID) | ORAL | Status: DC | PRN

## 2019-11-11 MED ORDER — ALBUTEROL SULFATE (2.5 MG/3ML) 0.083% IN NEBU: 2.5000 mg | INHALATION_SOLUTION | Freq: Four times a day (QID) | RESPIRATORY_TRACT | Status: DC | PRN

## 2019-11-11 MED ORDER — IOHEXOL 350 MG/ML SOLN
100.0000 mL | Freq: Once | INTRAVENOUS | Status: AC | PRN
  Administered 2019-11-11: 75 mL via INTRAVENOUS

## 2019-11-11 MED ORDER — SUMATRIPTAN SUCCINATE 6 MG/0.5ML ~~LOC~~ SOLN
6.0000 mg | SUBCUTANEOUS | Status: DC | PRN
  Filled 2019-11-11: qty 0.5

## 2019-11-11 MED ORDER — LIDOCAINE VISCOUS HCL 2 % MT SOLN: 15.0000 mL | Freq: Four times a day (QID) | OROMUCOSAL | Status: DC | PRN

## 2019-11-11 MED ORDER — FENTANYL 100 MCG/HR TD PT72
1.0000 | MEDICATED_PATCH | TRANSDERMAL | Status: DC
  Administered 2019-11-12: 1 via TRANSDERMAL
  Filled 2019-11-11: qty 1

## 2019-11-11 MED ORDER — POTASSIUM CHLORIDE CRYS ER 10 MEQ PO TBCR: 10.0000 meq | EXTENDED_RELEASE_TABLET | Freq: Two times a day (BID) | ORAL | Status: DC

## 2019-11-11 MED ORDER — LACTATED RINGERS IV BOLUS
500.0000 mL | Freq: Once | INTRAVENOUS | Status: AC
  Administered 2019-11-11: 500 mL via INTRAVENOUS

## 2019-11-11 NOTE — ED Triage Notes (Addendum)
Pt POV-  Pt c/o worsening ShOB x2 days.  Pt has used home nebulizer with no relief. Denies fever.  Pt reports having "a little head cold." Pt reports productive cough, recently ended rx antibiotic for pnuemonia.  Hx of aspiration.   Hx of metastatic cancer, receiving chemo at [facility].

## 2019-11-11 NOTE — ED Provider Notes (Signed)
[facility] DEPT Provider Note   CSN: [id number] Arrival date & time: 11/11/19  1825     History Chief Complaint  Patient presents with  . Shortness of Breath    Ashley Mayo is a 54 y.o. female.   Shortness of Breath Severity:  Severe Onset quality:  Gradual Timing:  Constant Progression:  Worsening Chronicity:  New Context comment:  Hx of lung cancer and recent treatment for pna Relieved by:  Rest (albuterol) Worsened by:  Exertion Ineffective treatments:  None tried Associated symptoms: cough, sputum production and wheezing   Associated symptoms: no chest pain, no fever, no headaches, no rash and no vomiting   Risk factors: hx of cancer        Past Medical History:  Diagnosis Date  . Anxiety   . Asthma   . Cancer ([place])    lung cancer  . COPD (chronic obstructive pulmonary disease) ([place])   . Depression   . GERD (gastroesophageal reflux disease)   . Hypertension   . Migraine     Patient Active Problem List   Diagnosis Date Noted  . Pneumonia of both lungs due to Streptococcus pneumoniae ([place])   . Respiratory failure, acute ([place]) 07/06/2019  . Pressure injury of skin 07/06/2019  . Severe sepsis ([place])   . ADHD 01/27/2019  . History of pulmonary embolism 01/27/2019  . 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 Hypomagnesemia 01/25/2019  . Symptomatic anemia   . Thrombocytopenia ([place])   . Leukopenia   . Perforated ear drum, bilateral 01/25/2016  . Multifocal pneumonia 01/23/2016  . Healthcare-associated pneumonia 01/23/2016  . Hypokalemia 01/23/2016  . Anxiety 01/23/2016  . Depression 01/23/2016  . Metastatic lung cancer (metastasis from lung to other site) ([place]) 01/23/2016  . Brain metastasis ([place]) 01/23/2016  . Essential hypertension 01/23/2016    Past Surgical History:  Procedure Laterality Date  . ABDOMINAL HYSTERECTOMY    . CESAREAN SECTION    . ORIF right hip fracture       OB History   No obstetric history on  file.     Family History  Problem Relation Age of Onset  . Breast cancer Mother     Social History   Tobacco Use  . Smoking status: Former Smoker    Packs/day: 0.25    Years: 35.00    Pack years: 8.75    Types: Cigarettes  . Smokeless tobacco: Never Used  Substance Use Topics  . Alcohol use: No  . Drug use: No    Home Medications Prior to Admission medications   Medication Sig Start Date End Date Taking? Authorizing Provider  albuterol (PROVENTIL HFA;VENTOLIN HFA) 108 (90 Base) MCG/ACT inhaler Inhale 1-2 puffs into the lungs every 6 (six) hours as needed for wheezing or shortness of breath.    [provider]  albuterol (PROVENTIL) (2.5 MG/3ML) 0.083% nebulizer solution Take 2.5 mg by nebulization every 6 (six) hours as needed for wheezing or shortness of breath.     [provider]  docusate sodium (COLACE) 100 MG capsule Take 1 capsule (100 mg total) by mouth 2 (two) times daily. 07/11/19   Guilford Shi, MD  esomeprazole (NEXIUM) 40 MG capsule Take 40 mg by mouth daily. 12/02/17   [provider]  feeding supplement, ENSURE ENLIVE, (ENSURE ENLIVE) LIQD Take 237 mLs by mouth 2 (two) times daily between meals. 07/12/19   Guilford Shi, MD  fentaNYL (DURAGESIC) 100 MCG/HR Place 1 patch onto the skin every 3 (three) days. 07/14/19  Guilford Shi, MD  furosemide (LASIX) 20 MG tablet Take 20 mg by mouth daily as needed for fluid.     [provider]  levETIRAcetam (KEPPRA) 500 MG tablet Take 1 tablet (500 mg total) by mouth 2 (two) times daily. 01/28/19 02/27/19  Dahal, Marlowe Aschoff, MD  lidocaine (LIDODERM) 5 % Place 1-2 patches onto the skin daily as needed (for back pain).     [provider]  lidocaine (XYLOCAINE) 2 % solution Use as directed 15 mLs in the mouth or throat every 6 (six) hours as needed for mouth pain.  11/24/18   [provider]  Magnesium 200 MG TABS Take 1 tablet (200 mg total) by mouth daily. 01/26/19    Terrilee Croak, MD  Multiple Vitamin (MULTIVITAMIN WITH MINERALS) TABS tablet Take 1 tablet by mouth daily. 07/12/19   Guilford Shi, MD  nystatin (MYCOSTATIN) 100000 UNIT/ML suspension Take 5 mLs (500,000 Units total) by mouth 4 (four) times daily. 12/30/17   Tacy Learn, PA-C  OLANZapine (ZYPREXA) 5 MG tablet Take 5 mg by mouth 2 (two) times daily.    [provider]  ondansetron (ZOFRAN) 8 MG tablet Take 8 mg by mouth every 8 (eight) hours as needed for nausea or vomiting.  11/01/18   [provider]  pantoprazole (PROTONIX) 40 MG tablet Take 1 tablet (40 mg total) by mouth at bedtime. 07/11/19 08/10/19  Guilford Shi, MD  polyethylene glycol (MIRALAX / GLYCOLAX) 17 g packet Take 17 g by mouth daily. 07/12/19   Guilford Shi, MD  potassium chloride (MICRO-K) 10 MEQ CR capsule Take 10 mEq by mouth 2 (two) times daily.  01/06/19   [provider]  SUMAtriptan (IMITREX) 6 MG/0.5ML SOLN injection Inject 6 mg into the skin every 2 (two) hours as needed for migraine.  11/30/18   [provider]    Allergies    Codeine and Shellfish allergy  Review of Systems   Review of Systems  Constitutional: Negative for chills and fever.  HENT: Negative for congestion and rhinorrhea.   Respiratory: Positive for cough, sputum production, shortness of breath and wheezing.   Cardiovascular: Negative for chest pain and palpitations.  Gastrointestinal: Negative for diarrhea, nausea and vomiting.  Genitourinary: Negative for difficulty urinating and dysuria.  Musculoskeletal: Negative for arthralgias and back pain.  Skin: Negative for rash and wound.  Neurological: Negative for light-headedness and headaches.    Physical Exam Updated Vital Signs BP 127/90   Pulse (!) 122   Temp 97.6 F (36.4 C)   Resp (!) 22   Ht 5\' 4"  (1.626 m)   Wt 43.5 kg   SpO2 93%   BMI 16.48 kg/m   Physical Exam Vitals and nursing note reviewed. Exam conducted with a chaperone  present.  Constitutional:      General: She is not in acute distress.    Appearance: Normal appearance.  HENT:     Head: Normocephalic and atraumatic.     Nose: No rhinorrhea.  Eyes:     General:        Right eye: No discharge.        Left eye: No discharge.     Conjunctiva/sclera: Conjunctivae normal.  Cardiovascular:     Rate and Rhythm: Regular rhythm. Tachycardia present.  Pulmonary:     Effort: Pulmonary effort is normal. Tachypnea present. No respiratory distress.     Breath sounds: No stridor. Decreased breath sounds and rhonchi present.  Abdominal:     General: Abdomen is flat.  There is no distension.     Palpations: Abdomen is soft.  Musculoskeletal:        General: No tenderness or signs of injury.     Right lower leg: No tenderness. No edema.     Left lower leg: No tenderness. No edema.  Skin:    General: Skin is warm and dry.  Neurological:     General: No focal deficit present.     Mental Status: She is alert. Mental status is at baseline.     Motor: No weakness.  Psychiatric:        Mood and Affect: Mood normal.        Behavior: Behavior normal.     ED Results / Procedures / Treatments   Labs (all labs ordered are listed, but only abnormal results are displayed) Labs Reviewed  CBC WITH DIFFERENTIAL/PLATELET - Abnormal; Notable for the following components:      Result Value   RBC 3.12 (*)    Hemoglobin 9.9 (*)    HCT 31.8 (*)    MCV 101.9 (*)    RDW 16.9 (*)    nRBC 0.4 (*)    Lymphs Abs 0.1 (*)    All other components within normal limits  BASIC METABOLIC PANEL - Abnormal; Notable for the following components:   Chloride 94 (*)    Glucose, Bld 145 (*)    BUN 29 (*)    All other components within normal limits  SARS CORONAVIRUS 2 BY RT PCR (HOSPITAL ORDER, [facility])  CULTURE, BLOOD (ROUTINE X 2)  CULTURE, BLOOD (ROUTINE X 2)  LACTIC ACID, PLASMA  LACTIC ACID, PLASMA  TROPONIN I (HIGH SENSITIVITY)  TROPONIN I (HIGH  SENSITIVITY)    EKG None  Radiology CT Angio Chest PE W and/or Wo Contrast  Result Date: 11/11/2019 CLINICAL DATA:  PE suspected, shortness of breath, tachycardia, history of metastatic cancer EXAM: CT ANGIOGRAPHY CHEST WITH CONTRAST TECHNIQUE: Multidetector CT imaging of the chest was performed using the standard protocol during bolus administration of intravenous contrast. Multiplanar CT image reconstructions and MIPs were obtained to evaluate the vascular anatomy. CONTRAST:  68mL OMNIPAQUE IOHEXOL 350 MG/ML SOLN COMPARISON:  CT chest, 07/07/2019 FINDINGS: Cardiovascular: Satisfactory opacification of the pulmonary arteries to the segmental level. No evidence of pulmonary embolism. Normal heart size. Coronary artery calcifications. No pericardial effusion. Left chest port catheter. Mediastinum/Nodes: Redemonstrated enlarged mediastinal lymph nodes. Thyroid gland, trachea, and esophagus demonstrate no significant findings. Lungs/Pleura: Scattered ground-glass opacities throughout the lungs. Unchanged nodule of the medial right upper lobe measuring 8 mm (series 6, image 30). Fibrotic scarring of the medial right upper lobe and lingula. No pleural effusion or pneumothorax. Upper Abdomen: No acute abnormality. Musculoskeletal: No chest wall abnormality. Numerous lytic and expansile osseous lesions, generally worsened compared to prior examination with increasing soft tissue components. There is a new high-grade wedge deformity of the T8 vertebral body with a substantial posterior soft tissue component intruding into the thecal space (series 8, image 70). Review of the MIP images confirms the above findings. IMPRESSION: 1. Negative examination for pulmonary embolism. 2. Scattered ground-glass opacities throughout the lungs, nonspecific and infectious or inflammatory. 3. Widespread osseous metastatic disease as seen on prior examination, generally worsened. 4. There is a new high-grade wedge deformity of the T8  vertebral body with a substantial posterior soft tissue component intruding into the thecal space. Correlate for referable symptoms. MRI of the thoracic spine may be used to further evaluate cord compression if  desired. 5. Enlarged mediastinal lymph nodes, similar to prior examination. 6. Coronary artery disease. Electronically Signed   By: Eddie Candle M.D.   On: 11/11/2019 20:47    Procedures Procedures (including critical care time)  Medications Ordered in ED Medications  levofloxacin (LEVAQUIN) IVPB 750 mg (has no administration in time range)  dexamethasone (DECADRON) injection 10 mg (10 mg Intravenous Given 11/11/19 1947)  ipratropium-albuterol (DUONEB) 0.5-2.5 (3) MG/3ML nebulizer solution 3 mL (3 mLs Nebulization Given 11/11/19 1940)  lactated ringers bolus 500 mL (0 mLs Intravenous Stopped 11/11/19 2042)  iohexol (OMNIPAQUE) 350 MG/ML injection 100 mL (75 mLs Intravenous Contrast Given 11/11/19 2001)    ED Course  I have reviewed the triage vital signs and the nursing notes.  Pertinent labs & imaging results that were available during my care of the patient were reviewed by me and considered in my medical decision making (see chart for details).    MDM Rules/Calculators/A&P                          54 year old female with history of metastatic lung cancer, comes in with worsening shortness of breath. Recent admission for pneumonia just finished a course of oral antibiotics, worsening symptoms of cough congestion exertional dyspnea. She has diffuse rhonchorous sounds throughout her lung fields, she is tachycardic and tachypneic. She is increase her Decadron to help with shortness of breath and try breathing treatments that only intermittently help. We will try breathing treatments here we will give her 10 of Decadron she will get a CTA PE study. She has had blood clots before and is on Eliquis already. She had a Covid test. She will get screening labs. She has been afebrile. She will get  a lactic acid and blood cultures and she will be given 500 mL of lactated Ringer's  Patient is feeling better after breathing treatments and steroids.  Antibiotics will be added, CT scan with contrast shows no acute PE but does show diffuse groundglass opacities.  This could be in the setting of COPD exacerbation versus viral process versus other underlying inflammatory process.  Covid test is negative.  Lactic acid is negative.  Kidney function is at baseline.  She will require admission because she is hypoxic to the mid upper 80s on room air resting.  She is placed on supplemental oxygen and has significant improvement.  I have consulted the hospitalist for admission.  The patient will be admitted to the hospitalist.  For the remainder this patient's care please see inpatient team notes.  I will intervene as needed while the patient remains in the emergency department.   Final Clinical Impression(s) / ED Diagnoses Final diagnoses:  COPD exacerbation ([place])  Acute hypoxemic respiratory failure ([facility])  Tachycardia  SOB (shortness of breath)    Rx / DC Orders ED Discharge Orders    None       Breck Coons, MD 11/11/19 2213

## 2019-11-12 ENCOUNTER — Encounter (HOSPITAL_COMMUNITY): Payer: Self-pay | Admitting: Internal Medicine

## 2019-11-12 DIAGNOSIS — J189 Pneumonia, unspecified organism: Secondary | ICD-10-CM | POA: Diagnosis present

## 2019-11-12 DIAGNOSIS — J40 Bronchitis, not specified as acute or chronic: Secondary | ICD-10-CM | POA: Diagnosis not present

## 2019-11-12 DIAGNOSIS — Z20822 Contact with and (suspected) exposure to covid-19: Secondary | ICD-10-CM | POA: Diagnosis present

## 2019-11-12 DIAGNOSIS — D649 Anemia, unspecified: Secondary | ICD-10-CM | POA: Diagnosis not present

## 2019-11-12 DIAGNOSIS — T451X5A Adverse effect of antineoplastic and immunosuppressive drugs, initial encounter: Secondary | ICD-10-CM | POA: Diagnosis present

## 2019-11-12 DIAGNOSIS — C3490 Malignant neoplasm of unspecified part of unspecified bronchus or lung: Secondary | ICD-10-CM | POA: Diagnosis present

## 2019-11-12 DIAGNOSIS — J209 Acute bronchitis, unspecified: Secondary | ICD-10-CM | POA: Diagnosis present

## 2019-11-12 DIAGNOSIS — R008 Other abnormalities of heart beat: Secondary | ICD-10-CM | POA: Diagnosis present

## 2019-11-12 DIAGNOSIS — J440 Chronic obstructive pulmonary disease with acute lower respiratory infection: Principal | ICD-10-CM

## 2019-11-12 DIAGNOSIS — R0602 Shortness of breath: Secondary | ICD-10-CM | POA: Diagnosis present

## 2019-11-12 DIAGNOSIS — I10 Essential (primary) hypertension: Secondary | ICD-10-CM | POA: Diagnosis present

## 2019-11-12 DIAGNOSIS — R000 Tachycardia, unspecified: Secondary | ICD-10-CM | POA: Diagnosis present

## 2019-11-12 DIAGNOSIS — D6481 Anemia due to antineoplastic chemotherapy: Secondary | ICD-10-CM | POA: Diagnosis present

## 2019-11-12 DIAGNOSIS — K219 Gastro-esophageal reflux disease without esophagitis: Secondary | ICD-10-CM | POA: Diagnosis present

## 2019-11-12 DIAGNOSIS — I493 Ventricular premature depolarization: Secondary | ICD-10-CM | POA: Diagnosis present

## 2019-11-12 DIAGNOSIS — R0902 Hypoxemia: Secondary | ICD-10-CM | POA: Diagnosis not present

## 2019-11-12 DIAGNOSIS — G893 Neoplasm related pain (acute) (chronic): Secondary | ICD-10-CM | POA: Diagnosis present

## 2019-11-12 DIAGNOSIS — S22060K Wedge compression fracture of T7-T8 vertebra, subsequent encounter for fracture with nonunion: Secondary | ICD-10-CM | POA: Diagnosis not present

## 2019-11-12 DIAGNOSIS — M549 Dorsalgia, unspecified: Secondary | ICD-10-CM | POA: Diagnosis present

## 2019-11-12 DIAGNOSIS — D849 Immunodeficiency, unspecified: Secondary | ICD-10-CM | POA: Diagnosis present

## 2019-11-12 DIAGNOSIS — J9601 Acute respiratory failure with hypoxia: Secondary | ICD-10-CM | POA: Diagnosis present

## 2019-11-12 DIAGNOSIS — G43909 Migraine, unspecified, not intractable, without status migrainosus: Secondary | ICD-10-CM | POA: Diagnosis present

## 2019-11-12 DIAGNOSIS — C7931 Secondary malignant neoplasm of brain: Secondary | ICD-10-CM | POA: Diagnosis present

## 2019-11-12 DIAGNOSIS — C3492 Malignant neoplasm of unspecified part of left bronchus or lung: Secondary | ICD-10-CM | POA: Diagnosis not present

## 2019-11-12 DIAGNOSIS — Z90710 Acquired absence of both cervix and uterus: Secondary | ICD-10-CM | POA: Diagnosis not present

## 2019-11-12 DIAGNOSIS — C7951 Secondary malignant neoplasm of bone: Secondary | ICD-10-CM | POA: Diagnosis present

## 2019-11-12 DIAGNOSIS — C779 Secondary and unspecified malignant neoplasm of lymph node, unspecified: Secondary | ICD-10-CM | POA: Diagnosis present

## 2019-11-12 DIAGNOSIS — J441 Chronic obstructive pulmonary disease with (acute) exacerbation: Secondary | ICD-10-CM

## 2019-11-12 DIAGNOSIS — M4854XA Collapsed vertebra, not elsewhere classified, thoracic region, initial encounter for fracture: Secondary | ICD-10-CM | POA: Diagnosis present

## 2019-11-12 DIAGNOSIS — Z87891 Personal history of nicotine dependence: Secondary | ICD-10-CM | POA: Diagnosis not present

## 2019-11-12 DIAGNOSIS — F329 Major depressive disorder, single episode, unspecified: Secondary | ICD-10-CM | POA: Diagnosis present

## 2019-11-12 LAB — BASIC METABOLIC PANEL
Anion gap: 13 (ref 5–15)
BUN: 31 mg/dL — ABNORMAL HIGH (ref 6–20)
CO2: 30 mmol/L (ref 22–32)
Calcium: 8.9 mg/dL (ref 8.9–10.3)
Chloride: 94 mmol/L — ABNORMAL LOW (ref 98–111)
Creatinine, Ser: 0.83 mg/dL (ref 0.44–1.00)
GFR calc Af Amer: >60 mL/min (ref >60)
GFR calc non Af Amer: >60 mL/min (ref >60)
Glucose, Bld: 94 mg/dL (ref 70–99)
Potassium: 4.6 mmol/L (ref 3.5–5.1)
Sodium: 137 mmol/L (ref 135–145)

## 2019-11-12 LAB — CBC WITH DIFFERENTIAL/PLATELET
Abs Immature Granulocytes: 0.02 10*3/uL (ref 0.00–0.07)
Basophils Absolute: 0 10*3/uL (ref 0.0–0.1)
Basophils Relative: 0 %
Eosinophils Absolute: 0 10*3/uL (ref 0.0–0.5)
Eosinophils Relative: 0 %
HCT: 29.7 % — ABNORMAL LOW (ref 36.0–46.0)
Hemoglobin: 9 g/dL — ABNORMAL LOW (ref 12.0–15.0)
Immature Granulocytes: 1 %
Lymphocytes Relative: 7 %
Lymphs Abs: 0.3 10*3/uL — ABNORMAL LOW (ref 0.7–4.0)
MCH: 31.6 pg (ref 26.0–34.0)
MCHC: 30.3 g/dL (ref 30.0–36.0)
MCV: 104.2 fL — ABNORMAL HIGH (ref 80.0–100.0)
Monocytes Absolute: 0.4 10*3/uL (ref 0.1–1.0)
Monocytes Relative: 10 %
Neutro Abs: 3 10*3/uL (ref 1.7–7.7)
Neutrophils Relative %: 82 %
Platelets: 200 10*3/uL (ref 150–400)
RBC: 2.85 MIL/uL — ABNORMAL LOW (ref 3.87–5.11)
RDW: 17 % — ABNORMAL HIGH (ref 11.5–15.5)
WBC: 3.7 10*3/uL — ABNORMAL LOW (ref 4.0–10.5)
nRBC: 1.6 % — ABNORMAL HIGH (ref 0.0–0.2)

## 2019-11-12 LAB — MAGNESIUM: Magnesium: 1.5 mg/dL — ABNORMAL LOW (ref 1.7–2.4)

## 2019-11-12 MED ORDER — ACETAMINOPHEN 650 MG RE SUPP: 650.0000 mg | Freq: Four times a day (QID) | RECTAL | Status: DC | PRN

## 2019-11-12 MED ORDER — BUDESONIDE 0.5 MG/2ML IN SUSP
0.5000 mg | Freq: Two times a day (BID) | RESPIRATORY_TRACT | Status: DC
  Administered 2019-11-12 – 2019-11-14 (×6): 0.5 mg via RESPIRATORY_TRACT
  Filled 2019-11-12 (×5): qty 2

## 2019-11-12 MED ORDER — OXYCODONE HCL 5 MG PO TABS
60.0000 mg | ORAL_TABLET | ORAL | Status: DC | PRN
  Administered 2019-11-12 – 2019-11-13 (×3): 60 mg via ORAL
  Filled 2019-11-12 (×4): qty 12

## 2019-11-12 MED ORDER — AMPHETAMINE-DEXTROAMPHET ER 10 MG PO CP24
30.0000 mg | ORAL_CAPSULE | Freq: Every day | ORAL | Status: DC
  Administered 2019-11-12 – 2019-11-14 (×3): 30 mg via ORAL
  Filled 2019-11-12 (×3): qty 3

## 2019-11-12 MED ORDER — ACETAMINOPHEN 325 MG PO TABS: 650.0000 mg | ORAL_TABLET | Freq: Four times a day (QID) | ORAL | Status: DC | PRN

## 2019-11-12 MED ORDER — SODIUM CHLORIDE 0.9 % IV SOLN: 250.0000 mL | INTRAVENOUS | Status: DC | PRN

## 2019-11-12 MED ORDER — LORATADINE 10 MG PO TABS
10.0000 mg | ORAL_TABLET | Freq: Every day | ORAL | Status: DC
  Administered 2019-11-12 – 2019-11-14 (×3): 10 mg via ORAL
  Filled 2019-11-12 (×3): qty 1

## 2019-11-12 MED ORDER — APIXABAN 5 MG PO TABS
5.0000 mg | ORAL_TABLET | Freq: Two times a day (BID) | ORAL | Status: DC
  Administered 2019-11-12 – 2019-11-14 (×5): 5 mg via ORAL
  Filled 2019-11-12 (×5): qty 1

## 2019-11-12 MED ORDER — SODIUM CHLORIDE 0.9% FLUSH
3.0000 mL | Freq: Two times a day (BID) | INTRAVENOUS | Status: DC
  Administered 2019-11-12 – 2019-11-14 (×5): 3 mL via INTRAVENOUS

## 2019-11-12 MED ORDER — DEXAMETHASONE 4 MG PO TABS
2.0000 mg | ORAL_TABLET | Freq: Every day | ORAL | Status: DC
  Administered 2019-11-12 – 2019-11-14 (×3): 2 mg via ORAL
  Filled 2019-11-12 (×3): qty 1

## 2019-11-12 MED ORDER — INFLUENZA VAC SPLIT QUAD 0.5 ML IM SUSY
0.5000 mL | PREFILLED_SYRINGE | INTRAMUSCULAR | Status: DC
  Filled 2019-11-12: qty 0.5

## 2019-11-12 MED ORDER — OXYCODONE HCL ER 20 MG PO T12A: 20.0000 mg | EXTENDED_RELEASE_TABLET | Freq: Four times a day (QID) | ORAL | Status: DC

## 2019-11-12 MED ORDER — IPRATROPIUM BROMIDE 0.02 % IN SOLN
0.5000 mg | Freq: Four times a day (QID) | RESPIRATORY_TRACT | Status: DC
  Administered 2019-11-12 – 2019-11-14 (×9): 0.5 mg via RESPIRATORY_TRACT
  Filled 2019-11-12 (×8): qty 2.5

## 2019-11-12 MED ORDER — OXYCODONE HCL ER 40 MG PO T12A
100.0000 mg | EXTENDED_RELEASE_TABLET | Freq: Four times a day (QID) | ORAL | Status: DC
  Administered 2019-11-12 – 2019-11-14 (×12): 100 mg via ORAL
  Filled 2019-11-12: qty 2
  Filled 2019-11-12 (×11): qty 1

## 2019-11-12 MED ORDER — FLUTICASONE PROPIONATE 50 MCG/ACT NA SUSP
2.0000 | Freq: Every day | NASAL | Status: DC
  Filled 2019-11-12: qty 16

## 2019-11-12 MED ORDER — HYDROCOD POLST-CPM POLST ER 10-8 MG/5ML PO SUER: 5.0000 mL | Freq: Two times a day (BID) | ORAL | Status: DC | PRN

## 2019-11-12 MED ORDER — MAGNESIUM SULFATE 4 GM/100ML IV SOLN
4.0000 g | Freq: Once | INTRAVENOUS | Status: AC
  Administered 2019-11-12: 4 g via INTRAVENOUS
  Filled 2019-11-12: qty 100

## 2019-11-12 MED ORDER — CARISOPRODOL 350 MG PO TABS: 350.0000 mg | ORAL_TABLET | Freq: Four times a day (QID) | ORAL | Status: DC | PRN

## 2019-11-12 MED ORDER — VALACYCLOVIR HCL 500 MG PO TABS
500.0000 mg | ORAL_TABLET | Freq: Every day | ORAL | Status: DC
  Administered 2019-11-12 – 2019-11-14 (×3): 500 mg via ORAL
  Filled 2019-11-12 (×3): qty 1

## 2019-11-12 MED ORDER — SODIUM CHLORIDE 0.9% FLUSH: 3.0000 mL | INTRAVENOUS | Status: DC | PRN

## 2019-11-12 MED ORDER — LEVALBUTEROL HCL 0.63 MG/3ML IN NEBU
0.6300 mg | INHALATION_SOLUTION | Freq: Four times a day (QID) | RESPIRATORY_TRACT | Status: DC
  Administered 2019-11-12 – 2019-11-14 (×9): 0.63 mg via RESPIRATORY_TRACT
  Filled 2019-11-12 (×9): qty 3

## 2019-11-12 MED ORDER — GUAIFENESIN ER 600 MG PO TB12
1200.0000 mg | ORAL_TABLET | Freq: Two times a day (BID) | ORAL | Status: DC
  Administered 2019-11-12 – 2019-11-13 (×2): 1200 mg via ORAL
  Filled 2019-11-12 (×2): qty 2

## 2019-11-12 MED ORDER — OXYCODONE HCL ER 40 MG PO T12A: 80.0000 mg | EXTENDED_RELEASE_TABLET | Freq: Four times a day (QID) | ORAL | Status: DC

## 2019-11-12 NOTE — H&P (Signed)
History and Physical    Ashley Mayo WRU:[id number] DOB: Jul 13, 1965 DOA: 11/11/2019  PCP: Pcp, No (Confirm with patient/family/NH records and if not entered, this has to be entered at [facility] point of entry) Patient coming from: home  I have personally briefly reviewed patient's old medical records in [place]  Chief Complaint: progressive SOB  HPI: Ashley Mayo is a 54 y.o. female with medical history significant of lung cancer with mets to brain, nodes, bones; HTN, Asthma, COPD. Patient was seen by [facility] oncology Sept 15th for infusion chemo. At that time she was diagnosed with bronchitis but w/o abx prescribed. Over the next several days she had increased cough with a productive sputum and progressive SOB. She tried TMP/SMX that was left over from previous illness w/o improvement. Due to increasing SOB she presents to [facility] for evaluation   ED Course: Tmax 97.6  130/49  HR 117  RR 20 O2 sat RA 85% improving to 100% with 2 L [place] O2. CTA w/o new PE, scattered ground glass opacities, new T8 wedge fracture. Cmet with glucose of 145. Lactic acid #1 1.4, #2 1.6. WBC 7.9 with 95/2/2, Hgb 9.9. In ED she received Decadron IV 10 mg, Levaquin 750 mg IV. TRH called to admit for continued treatment of acute bronchitis with hypoxemia.  Review of Systems: As per HPI otherwise 10 point review of systems negative. In addition she feels a lump/node left groin which appeared after a fall.   Past Medical History:  Diagnosis Date  . Anxiety   . Asthma   . Cancer ([place])    lung cancer  . COPD (chronic obstructive pulmonary disease) ([place])   . Depression   . GERD (gastroesophageal reflux disease)   . Hypertension   . Migraine     Past Surgical History:  Procedure Laterality Date  . ABDOMINAL HYSTERECTOMY    . CESAREAN SECTION    . ORIF right hip fracture      Social Hx  - married 10 years, divorced. Raised 3 boys and 1 girl and now has 3 grandchildren. She worked in Librarian, academic. She lives  with her son and the rest of the family is close.     reports that she has quit smoking. Her smoking use included cigarettes. She has a 8.75 pack-year smoking history. She has never used smokeless tobacco. She reports that she does not drink alcohol and does not use drugs.  Allergies  Allergen Reactions  . Codeine Hives  . Shellfish Allergy Hives    Family History  Problem Relation Age of Onset  . Breast cancer Mother      Prior to Admission medications   Medication Sig Start Date End Date Taking? Authorizing Provider  albuterol (PROVENTIL HFA;VENTOLIN HFA) 108 (90 Base) MCG/ACT inhaler Inhale 1-2 puffs into the lungs every 6 (six) hours as needed for wheezing or shortness of breath.   Yes [provider]  albuterol (PROVENTIL) (2.5 MG/3ML) 0.083% nebulizer solution Take 2.5 mg by nebulization every 6 (six) hours as needed for wheezing or shortness of breath.    Yes [provider]  amphetamine-dextroamphetamine (ADDERALL XR) 30 MG 24 hr capsule Take 1 capsule by mouth daily. 06/15/18  Yes [provider]  carisoprodol (SOMA) 350 MG tablet Take 350 mg by mouth 4 (four) times daily as needed. 10/20/19  Yes [provider]  dexamethasone (DECADRON) 2 MG tablet Take 1 tablet by mouth daily. 10/20/19  Yes [provider]  docusate sodium (COLACE) 100 MG capsule  Take 1 capsule (100 mg total) by mouth 2 (two) times daily. 07/11/19  Yes Guilford Shi, MD  ELIQUIS 5 MG TABS tablet Take 5 mg by mouth 2 (two) times daily. 10/26/19  Yes [provider]  esomeprazole (NEXIUM) 40 MG capsule Take 40 mg by mouth daily. 12/02/17  Yes [provider]  feeding supplement, ENSURE ENLIVE, (ENSURE ENLIVE) LIQD Take 237 mLs by mouth 2 (two) times daily between meals. 07/12/19  Yes Guilford Shi, MD  fentaNYL (DURAGESIC) 100 MCG/HR Place 1 patch onto the skin every 3 (three) days. 07/14/19  Yes Guilford Shi, MD  furosemide (LASIX) 20 MG tablet  Take 20 mg by mouth daily as needed for fluid.    Yes [provider]  levETIRAcetam (KEPPRA) 500 MG tablet Take 1 tablet (500 mg total) by mouth 2 (two) times daily. 01/28/19 11/12/19 Yes Dahal, Marlowe Aschoff, MD  lidocaine (LIDODERM) 5 % Place 1-2 patches onto the skin daily as needed (for back pain).    Yes [provider]  lidocaine (XYLOCAINE) 2 % solution Use as directed 15 mLs in the mouth or throat every 6 (six) hours as needed for mouth pain.  11/24/18  Yes [provider]  Magnesium 200 MG TABS Take 1 tablet (200 mg total) by mouth daily. 01/26/19  Yes Dahal, Marlowe Aschoff, MD  Multiple Vitamin (MULTIVITAMIN WITH MINERALS) TABS tablet Take 1 tablet by mouth daily. 07/12/19  Yes Guilford Shi, MD  nystatin (MYCOSTATIN) 100000 UNIT/ML suspension Take 5 mLs (500,000 Units total) by mouth 4 (four) times daily. 12/30/17  Yes Tacy Learn, PA-C  OLANZapine (ZYPREXA) 5 MG tablet Take 5 mg by mouth 2 (two) times daily.   Yes [provider]  ondansetron (ZOFRAN) 8 MG tablet Take 8 mg by mouth every 8 (eight) hours as needed for nausea or vomiting.  11/01/18  Yes [provider]  oxyCODONE (OXYCONTIN) 20 mg 12 hr tablet Take 1 tablet by mouth every 6 (six) hours. Take w/ oxycodone 80mg  for total dose of 100 mg 10/23/19 12/01/19 Yes [provider]  oxyCODONE (OXYCONTIN) 80 mg 12 hr tablet Take 1 tablet by mouth every 6 (six) hours. Take w/ oxycodone 20mg  for total dose of 100mg  10/23/19 12/01/19 Yes [provider]  oxycodone (ROXICODONE) 30 MG immediate release tablet Take 2 tablets by mouth every 3 (three) hours as needed for other. PRN cancer pain 10/23/19 11/23/19 Yes [provider]  pantoprazole (PROTONIX) 40 MG tablet Take 1 tablet (40 mg total) by mouth at bedtime. 07/11/19 11/12/19 Yes Guilford Shi, MD  polyethylene glycol (MIRALAX / GLYCOLAX) 17 g packet Take 17 g by mouth daily. 07/12/19  Yes Guilford Shi, MD  SUMAtriptan (IMITREX) 6  MG/0.5ML SOLN injection Inject 6 mg into the skin every 2 (two) hours as needed for migraine.  11/30/18  Yes [provider]  valACYclovir (VALTREX) 500 MG tablet Take 500 mg by mouth daily. 09/28/19  Yes [provider]  potassium chloride (MICRO-K) 10 MEQ CR capsule Take 10 mEq by mouth 2 (two) times daily.  01/06/19   [provider]    Physical Exam: Vitals:   11/11/19 2215 11/11/19 2230 11/11/19 2315 11/11/19 2330  BP: (!) 136/104 (!) 117/94 (!) 124/92 (!) 130/99  Pulse: (!) 127 (!) 119 (!) 114 (!) 117  Resp: 19 (!) 21  20  Temp:      SpO2: 97% 98% 100% 98%  Weight:      Height:  Vitals:   11/11/19 2215 11/11/19 2230 11/11/19 2315 11/11/19 2330  BP: (!) 136/104 (!) 117/94 (!) 124/92 (!) 130/99  Pulse: (!) 127 (!) 119 (!) 114 (!) 117  Resp: 19 (!) 21  20  Temp:      SpO2: 97% 98% 100% 98%  Weight:      Height:       General:   Thin woman in no acute distress. Eyes: PERRL, lids and conjunctivae normal ENMT: Mucous membranes are moist. Posterior pharynx clear of any exudate or lesions.  Neck: normal, supple, no masses, no thyromegaly Respiratory: No increased WOB: no use of accessory muscles, no neck retractions. Prolonged E/I ratio. Diffuse rales all fields, no wheezing. Able to talk whole sentences. Nodes - no axillary, supraclavicular or cerevical nodes. 1 cm node left groin - mobile and tender.  Cardiovascular: Regular rate and rhythm, no murmurs / rubs / gallops. No extremity edema. 2+ pedal pulses. No carotid bruits.  Abdomen: no tenderness, no masses palpated. No hepatosplenomegaly. Bowel sounds positive.  Musculoskeletal: no clubbing / cyanosis. No joint deformity upper and lower extremities. Good ROM, no contractures. Normal muscle tone.  Skin: no rashes, lesions, ulcers. No induration Neurologic: CN 2-12 grossly intact. Sensation intact. Strength 4/5 in all 4.  Psychiatric: Normal judgment and insight. Alert and oriented x 3. Normal  mood.     Labs on Admission: I have personally reviewed following labs and imaging studies  CBC: Recent Labs  Lab 11/11/19 1921  WBC 7.9  NEUTROABS 7.6  HGB 9.9*  HCT 31.8*  MCV 101.9*  PLT 921   Basic Metabolic Panel: Recent Labs  Lab 11/11/19 1921  NA 138  K 5.0  CL 94*  CO2 30  GLUCOSE 145*  BUN 29*  CREATININE 0.82  CALCIUM 9.6   GFR: Estimated Creatinine Clearance: 53.9 mL/min (by C-G formula based on SCr of 0.82 mg/dL). Liver Function Tests: No results for input(s): AST, ALT, ALKPHOS, BILITOT, PROT, ALBUMIN in the last 168 hours. No results for input(s): LIPASE, AMYLASE in the last 168 hours. No results for input(s): AMMONIA in the last 168 hours. Coagulation Profile: No results for input(s): INR, PROTIME in the last 168 hours. Cardiac Enzymes: No results for input(s): CKTOTAL, CKMB, CKMBINDEX, TROPONINI in the last 168 hours. BNP (last 3 results) No results for input(s): PROBNP in the last 8760 hours. HbA1C: No results for input(s): HGBA1C in the last 72 hours. CBG: No results for input(s): GLUCAP in the last 168 hours. Lipid Profile: No results for input(s): CHOL, HDL, LDLCALC, TRIG, CHOLHDL, LDLDIRECT in the last 72 hours. Thyroid Function Tests: No results for input(s): TSH, T4TOTAL, FREET4, T3FREE, THYROIDAB in the last 72 hours. Anemia Panel: No results for input(s): VITAMINB12, FOLATE, FERRITIN, TIBC, IRON, RETICCTPCT in the last 72 hours. Urine analysis:    Component Value Date/Time   COLORURINE YELLOW 07/06/2019 [place] 07/06/2019 1354   LABSPEC 1.017 07/06/2019 1354   PHURINE 6.0 07/06/2019 1354   GLUCOSEU NEGATIVE 07/06/2019 1354   HGBUR MODERATE (A) 07/06/2019 1354   BILIRUBINUR NEGATIVE 07/06/2019 1354   KETONESUR 80 (A) 07/06/2019 1354   PROTEINUR 100 (A) 07/06/2019 1354   NITRITE NEGATIVE 07/06/2019 1354   LEUKOCYTESUR NEGATIVE 07/06/2019 1354    Radiological Exams on Admission: CT Angio Chest PE W and/or Wo  Contrast  Result Date: 11/11/2019 CLINICAL DATA:  PE suspected, shortness of breath, tachycardia, history of metastatic cancer EXAM: CT ANGIOGRAPHY CHEST WITH CONTRAST TECHNIQUE: Multidetector CT imaging of the  chest was performed using the standard protocol during bolus administration of intravenous contrast. Multiplanar CT image reconstructions and MIPs were obtained to evaluate the vascular anatomy. CONTRAST:  31mL OMNIPAQUE IOHEXOL 350 MG/ML SOLN COMPARISON:  CT chest, 07/07/2019 FINDINGS: Cardiovascular: Satisfactory opacification of the pulmonary arteries to the segmental level. No evidence of pulmonary embolism. Normal heart size. Coronary artery calcifications. No pericardial effusion. Left chest port catheter. Mediastinum/Nodes: Redemonstrated enlarged mediastinal lymph nodes. Thyroid gland, trachea, and esophagus demonstrate no significant findings. Lungs/Pleura: Scattered ground-glass opacities throughout the lungs. Unchanged nodule of the medial right upper lobe measuring 8 mm (series 6, image 30). Fibrotic scarring of the medial right upper lobe and lingula. No pleural effusion or pneumothorax. Upper Abdomen: No acute abnormality. Musculoskeletal: No chest wall abnormality. Numerous lytic and expansile osseous lesions, generally worsened compared to prior examination with increasing soft tissue components. There is a new high-grade wedge deformity of the T8 vertebral body with a substantial posterior soft tissue component intruding into the thecal space (series 8, image 70). Review of the MIP images confirms the above findings. IMPRESSION: 1. Negative examination for pulmonary embolism. 2. Scattered ground-glass opacities throughout the lungs, nonspecific and infectious or inflammatory. 3. Widespread osseous metastatic disease as seen on prior examination, generally worsened. 4. There is a new high-grade wedge deformity of the T8 vertebral body with a substantial posterior soft tissue component  intruding into the thecal space. Correlate for referable symptoms. MRI of the thoracic spine may be used to further evaluate cord compression if desired. 5. Enlarged mediastinal lymph nodes, similar to prior examination. 6. Coronary artery disease. Electronically Signed   By: Eddie Candle M.D.   On: 11/11/2019 20:47    EKG: Independently reviewed. Sinus tachycardia  Assessment/Plan Active Problems:   Bronchitis   Hypoxemia   Essential hypertension   Metastatic lung cancer (metastasis from lung to other site) [facility])   Brain metastasis ([place])  (please populate well all problems here in Problem List. (For example, if patient is on BP meds at home and you resume or decide to hold them, it is a problem that needs to be her. Same for CAD, COPD, HLD and so on)   1. Pulmonary - acute bronchitis with productive sputum in setting of COPD/asthma and lung cancer. WBC in normal range but 3 days post infusion chemo which may lead to false reading. Definite left shift suggesting bacterial infection. Along with this acute hypoxemia  Plan Med-surg admit  Continue levaquin - convert to PO if patient stabilizes  Continue bronchodilator therapy  Continue oncologic dosing of decadron  Tussionex for cough  Continue O2 to keep O2 sat >90%  2. Oncology - patient with metastatic lung cancer. Had last chemo infusion 11/08/19 Plan Continue home regimen of decadron  Continue home pain mgt regimen with fentanyl patch and high dose oxycodone.   3. HTN- continue home meds   DVT prophylaxis: Eliquis  Code Status: full code  Family Communication: tried to leave message for daughter Bethann Humble but mail box full  Disposition Plan: home when stable  Consults called: none (with names) Admission status: inpatient    Adella Hare MD [facility] Pager [phone]  If 7PM-7AM, please contact night-coverage [email or web address] Password [facility]  11/12/2019, 12:50 AM

## 2019-11-12 NOTE — Evaluation (Signed)
Physical Therapy Evaluation Patient Details Name: Ashley Mayo MRN: [id number] DOB: [birth year] Today's Date: 11/12/2019   History of Present Illness  Pt admitted with SOB 2* Bronchitis in the setting of COPD/asthma and metastatic Lung CA.    Clinical Impression  Pt admitted as above and presenting with functional mobility limitations 2* generalized weakness, mild ambulatory balance deficits, and decreased endurance.  This date, at pt request attempted ambulation on RA - pt desat to 86% with HR elevated to 136 after ambulating ~150'.  Pt ambulated remainder of distance on 2L with sats at 94% or higher and max HR 110.     Follow Up Recommendations No PT follow up    Equipment Recommendations  None recommended by PT    Recommendations for Other Services OT consult     Precautions / Restrictions Precautions Precautions: Fall Restrictions Weight Bearing Restrictions: No      Mobility  Bed Mobility Overal bed mobility: Modified Independent             General bed mobility comments: Pt supine<>sit unassisted  Transfers Overall transfer level: Needs assistance Equipment used: None Transfers: Sit to/from Stand Sit to Stand: Supervision         General transfer comment: safety  Ambulation/Gait Ambulation/Gait assistance: Min guard;Supervision Gait Distance (Feet): 500 Feet Assistive device: IV Pole Gait Pattern/deviations: Step-through pattern;Decreased step length - right;Decreased step length - left;Shuffle;Trunk flexed     General Gait Details: assist to manage IVpole/O2 tank  Stairs            Wheelchair Mobility    Modified Rankin (Stroke Patients Only)       Balance Overall balance assessment: Needs assistance Sitting-balance support: Feet supported;No upper extremity supported Sitting balance-Leahy Scale: Good     Standing balance support: No upper extremity supported Standing balance-Leahy Scale: Fair                                Pertinent Vitals/Pain Pain Assessment: 0-10 Pain Score: 3  Pain Location: back pain "I have fractures from my CA" Pain Descriptors / Indicators: Aching;Sore Pain Intervention(s): Limited activity within patient's tolerance;Monitored during session    [name] expects to be discharged to:: Private residence Living Arrangements: Children Available Help at Discharge: Family Type of Home: House Home Access: Stairs to enter   Technical brewer of Steps: 1 Home Layout: Two level;1/2 bath on main level Home Equipment: Cane - single point      Prior Function Level of Independence: Independent;Independent with assistive device(s)         Comments: Using cane since recent trip over dog     Hand Dominance        Extremity/Trunk Assessment   Upper Extremity Assessment Upper Extremity Assessment: Generalized weakness    Lower Extremity Assessment Lower Extremity Assessment: Generalized weakness       Communication   Communication: No difficulties  Cognition Arousal/Alertness: Awake/alert Behavior During Therapy: WFL for tasks assessed/performed Overall Cognitive Status: Within Functional Limits for tasks assessed                                        General Comments      Exercises     Assessment/Plan    PT Assessment Patient needs continued PT services  PT Problem List Decreased strength;Decreased activity tolerance;Decreased balance;Decreased mobility;Decreased knowledge of  use of DME;Pain       PT Treatment Interventions DME instruction;Gait training;Stair training;Functional mobility training;Therapeutic activities;Therapeutic exercise;Patient/family education;Balance training    PT Goals (Current goals can be found in the Care Plan section)  Acute Rehab PT Goals Patient Stated Goal: Get off the O2 before DC home PT Goal Formulation: With patient Time For Goal Achievement: 11/26/19 Potential to Achieve Goals:  Good    Frequency Min 3X/week   Barriers to discharge        Co-evaluation               AM-PAC PT "6 Clicks" Mobility  Outcome Measure Help needed turning from your back to your side while in a flat bed without using bedrails?: None Help needed moving from lying on your back to sitting on the side of a flat bed without using bedrails?: None Help needed moving to and from a bed to a chair (including a wheelchair)?: A Little Help needed standing up from a chair using your arms (e.g., wheelchair or bedside chair)?: A Little Help needed to walk in hospital room?: A Little Help needed climbing 3-5 steps with a railing? : A Little 6 Click Score: 20    End of Session Equipment Utilized During Treatment: Gait belt;Oxygen Activity Tolerance: Patient tolerated treatment well Patient left: in bed;with call bell/phone within reach Nurse Communication: Mobility status PT Visit Diagnosis: Difficulty in walking, not elsewhere classified (R26.2);Unsteadiness on feet (R26.81);Pain Pain - part of body:  (back)    Time: 0340-3524 PT Time Calculation (min) (ACUTE ONLY): 21 min   Charges:   PT Evaluation $PT Eval Low Complexity: 1 Low          [place] Pager [phone] Office [phone]   Ashley Mayo 11/12/2019, 4:58 PM

## 2019-11-12 NOTE — Progress Notes (Signed)
I have seen and assessed patient and agree with Dr. Cloria Spring assessment and plan.  Patient is a pleasant unfortunate 54 year old female with history of lung cancer with mets to the brain, lymph nodes, bones, hypertension, COPD, currently getting ongoing chemo and noted to have been diagnosed with a bronchitis during her last chemo session on 11/08/2019.  Patient presented to the ED with increasing cough, or progressive shortness of breath and admitted and being treated for an acute bronchitis.  Patient also noted to have some wheezing.  Will place on Pulmicort twice daily, Mucinex, scheduled Xopenex and Atrovent nebs,.  Continue Decadron, Levaquin.  CT angiogram chest with some scattered groundglass opacities throughout the lungs, nonspecific infectious or inflammatory, widespread osseous metastatic disease as seen on prior examination generally worsened, new high-grade wedge deformity of T8 vertebral body with substantial posterior soft tissue component intruding into the thecal space.  Patient denies any numbness or tingling or weakness in the lower extremities.  Patient denies any significant change from chronic back pain.  States was to have surgery on her back in the past however noted to have some cardiac issues and surgery subsequently canceled.  Patient states was not deemed a surgical candidate at that time.  Get MRI of the back to follow-up on T8 high-grade wedge deformity.  Supportive care.  Follow.  No charge.

## 2019-11-12 NOTE — Progress Notes (Signed)
PROGRESS NOTE    Ashley Mayo  YKZ:[id number] DOB: 1965-12-12 DOA: 11/11/2019 PCP: Pcp, No   Chief Complaint  Patient presents with   Shortness of Breath    Brief Narrative:  HPI per Dr. [name] is a 54 y.o. female with medical history significant of lung cancer with mets to brain, nodes, bones; HTN, Asthma, COPD. Patient was seen by [facility] oncology Sept 15th for infusion chemo. At that time she was diagnosed with bronchitis but w/o abx prescribed. Over the next several days she had increased cough with a productive sputum and progressive SOB. She tried TMP/SMX that was left over from previous illness w/o improvement. Due to increasing SOB she presents to [facility] for evaluation   ED Course: Tmax 97.6  130/49  HR 117  RR 20 O2 sat RA 85% improving to 100% with 2 L [place] O2. CTA w/o new PE, scattered ground glass opacities, new T8 wedge fracture. Cmet with glucose of 145. Lactic acid #1 1.4, #2 1.6. WBC 7.9 with 95/2/2, Hgb 9.9. In ED she received Decadron IV 10 mg, Levaquin 750 mg IV. TRH called to admit for continued treatment of acute bronchitis with hypoxemia.  Assessment & Plan:   Active Problems:   Metastatic lung cancer (metastasis from lung to other site) [facility])   Brain metastasis (HCC)   Essential hypertension   Bronchitis   Hypoxemia   Tachycardia   Sinus tachycardia   Closed wedge compression fracture of T8 vertebra (HCC)   Gastroesophageal reflux disease   Anemia   Depression  #1 COPD with acute bronchitis vs CAP/hypoxemia Patient presenting with increasing shortness of breath, increasing productive cough, CT angiogram chest done negative for PE however showed some scattered groundglass opacities.  WBC within normal range post chemo infusion.  Patient with a left shift noted on CBC concerning for possible underlying bacterial infection.  Patient also noted with an acute hypoxemia.  Patient is immunocompromised host, with increased risk for Pseudomonas infection.   Patient on IV Levaquin which she is tolerating will transition to oral Levaquin.  Continue bronchodilator therapy with Pulmicort, continue Flonase, Claritin, PPI, Atrovent and Xopenex nebs.  Patient on Decadron daily.  DC Mucinex and placed on toxin X twice daily.  O2 to keep sats between 88 to 93%.  Check ambulatory sats tomorrow.  Follow.  2.  Hypertension Stable.  Patient noted per recent cardiology office visit to have been on Toprol-XL 25 mg daily which we will resume.  Follow.  3.  Sinus tachycardia/PVCs Patient noted to have a history of PVCs and bigeminy being followed by cardiology in the outpatient setting.  Patient during last cardiology visit noted to have heart rates in the 120s.  Per cardiology note patient noted to be on Toprol-XL 25 mg daily which is not on her med rec.  Will resume Toprol-XL at 25 mg daily.  Outpatient follow-up with cardiology.  4.  History of metastatic lung cancer with mets to the brain, nodes, osseous/cancer related pain Being followed in the outpatient setting at [facility].  Continue home pain regimen.  Outpatient follow-up.  5.  New high-grade wedge deformity of T8 vertebral body Noted on CT angiogram chest.  Patient denies any significant weakness in lower extremities, no numbness or tingling, no bowel or bladder incontinence.  States no significant change in her chronic back pain.  MRI of the T-spine pending.  Patient stated was having surgery before on her back and had some cardiac issues and as such surgery had to be  stopped.  Will have patient follow-up in the outpatient setting with oncology/neurosurgery.  6.  Anemia Patient with recent chemotherapy.  Anemia likely secondary to chemotherapy.  Patient with no overt bleeding.  Follow H&H.  Transfusion threshold hemoglobin < 7.  7.  Gastroesophageal reflux disease PPI.  8.  Depression Stable.  Continue Zyprexa.  Outpatient follow-up.  9.  History of migraine headaches Stable.  Imitrex as  needed.    DVT prophylaxis: Eliquis Code Status: Full Family Communication: Updated patient.  No family at bedside. Disposition:   Status is: Inpatient    Dispo: The patient is from: Home              Anticipated d/c is to: Home              Anticipated d/c date is: 1 to 2 days.              Patient currently on IV antibiotics, nebulizer treatments, not stable for discharge.       Consultants:   None  Procedures:   MRI T-spine pending  CT angiogram chest 11/11/2019    Antimicrobials:   IV azithromycin 11/13/2019 x1 dose>>>>  IV Levaquin 11/11/2019>>>> 11/13/2019  Oral Levaquin 11/14/2019   Subjective: Laying in bed.  Feeling better.  Feels cough is slowly improving however would prefer Tussionex than the Mucinex.  No chest pain.  No abdominal pain.  Tolerating current diet.  Patient states was not on oxygen prior to admission.  Objective: Vitals:   11/13/19 0735 11/13/19 0808 11/13/19 1004 11/13/19 1341  BP: (!) 121/91  118/81 103/84  Pulse: (!) 123  (!) 120 (!) 111  Resp: 18  18 18   Temp: 98.5 F (36.9 C)  98.1 F (36.7 C) 98.3 F (36.8 C)  TempSrc: Oral  Oral Oral  SpO2: 93% 95% 93% 94%  Weight:      Height:        Intake/Output Summary (Last 24 hours) at 11/13/2019 1643 Last data filed at 11/13/2019 1300 Gross per 24 hour  Intake 255.77 ml  Output 1100 ml  Net -844.23 ml   Filed Weights   11/11/19 1856 11/12/19 0101  Weight: 43.5 kg 43.4 kg    Examination:  General exam: Appears calm and comfortable  Respiratory system: Minimal to mild expiratory wheezing.  Some coarse breath sounds.  No crackles.  Speaking in full sentences.  Cardiovascular system: Tachycardia no murmurs rubs or gallops.  No JVD.  No lower extremity edema.  Gastrointestinal system: Abdomen is soft, nontender, nondistended, positive bowel sounds.  No rebound.  No guarding. Central nervous system: Alert and oriented. No focal neurological deficits. Extremities: Symmetric 5 x  5 power. Skin: No rashes, lesions or ulcers Psychiatry: Judgement and insight appear normal. Mood & affect appropriate.     Data Reviewed: I have personally reviewed following labs and imaging studies  CBC: Recent Labs  Lab 11/11/19 1921 11/12/19 0900 11/13/19 0539  WBC 7.9 3.7* 7.4  NEUTROABS 7.6 3.0 6.2  HGB 9.9* 9.0* 9.2*  HCT 31.8* 29.7* 30.7*  MCV 101.9* 104.2* 105.1*  PLT 221 200 735    Basic Metabolic Panel: Recent Labs  Lab 11/11/19 1921 11/12/19 0900 11/13/19 0539  NA 138 137 139  K 5.0 4.6 4.3  CL 94* 94* 92*  CO2 30 30 33*  GLUCOSE 145* 94 88  BUN 29* 31* 36*  CREATININE 0.82 0.83 0.79  CALCIUM 9.6 8.9 9.1  MG  --  1.5* 1.8    GFR:  Estimated Creatinine Clearance: 55.1 mL/min (by C-G formula based on SCr of 0.79 mg/dL).  Liver Function Tests: No results for input(s): AST, ALT, ALKPHOS, BILITOT, PROT, ALBUMIN in the last 168 hours.  CBG: No results for input(s): GLUCAP in the last 168 hours.   Recent Results (from the past 240 hour(s))  SARS Coronavirus 2 by RT PCR (hospital order, performed in [facility] hospital lab) Nasopharyngeal Nasopharyngeal Swab     Status: None   Collection Time: 11/11/19  7:30 PM   Specimen: Nasopharyngeal Swab  Result Value Ref Range Status   SARS Coronavirus 2 NEGATIVE NEGATIVE Final    Comment: (NOTE) SARS-CoV-2 target nucleic acids are NOT DETECTED.  The SARS-CoV-2 RNA is generally detectable in upper and lower respiratory specimens during the acute phase of infection. The lowest concentration of SARS-CoV-2 viral copies this assay can detect is 250 copies / mL. A negative result does not preclude SARS-CoV-2 infection and should not be used as the sole basis for treatment or other patient management decisions.  A negative result may occur with improper specimen collection / handling, submission of specimen other than nasopharyngeal swab, presence of viral mutation(s) within the areas targeted by this assay, and  inadequate number of viral copies (<250 copies / mL). A negative result must be combined with clinical observations, patient history, and epidemiological information.  Fact Sheet for Patients:   StrictlyIdeas.no  Fact Sheet for Healthcare Providers: BankingDealers.co.za  This test is not yet approved or  cleared by the [place] FDA and has been authorized for detection and/or diagnosis of SARS-CoV-2 by FDA under an Emergency Use Authorization (EUA).  This EUA will remain in effect (meaning this test can be used) for the duration of the COVID-19 declaration under Section 564(b)(1) of the Act, 21 U.S.C. section 360bbb-3(b)(1), unless the authorization is terminated or revoked sooner.  Performed at [facility], [place] [address] [place], [place] [address]   Blood culture (routine x 2)     Status: None (Preliminary result)   Collection Time: 11/11/19  7:32 PM   Specimen: BLOOD LEFT FOREARM  Result Value Ref Range Status   Specimen Description   Final    BLOOD LEFT FOREARM Performed at [facility], [place] [address], [place], [place] [address]    Special Requests   Final    BOTTLES DRAWN AEROBIC AND ANAEROBIC Blood Culture results may not be optimal due to an inadequate volume of blood received in culture bottles Performed at [place] [address] [place], [place] [address]    Culture   Final    NO GROWTH 2 DAYS Performed at [place] [address] [place], [place] [address]    Report Status PENDING  Incomplete  Blood culture (routine x 2)     Status: None (Preliminary result)   Collection Time: 11/11/19  7:37 PM   Specimen: BLOOD  Result Value Ref Range Status   Specimen Description   Final    BLOOD RIGHT ANTECUBITAL Performed at [place] [address] [place], [place] [address]    Special Requests   Final    BOTTLES DRAWN AEROBIC AND  ANAEROBIC Blood Culture adequate volume Performed at [place] [address], [place], [place] [address]    Culture   Final    NO GROWTH 2 DAYS Performed at [place] [address], [place],  [address]    Report Status PENDING  Incomplete         Radiology Studies: CT Angio Chest PE W and/or Wo Contrast  Result Date: 11/11/2019 CLINICAL DATA:  PE suspected, shortness of breath, tachycardia, history of metastatic cancer EXAM: CT ANGIOGRAPHY CHEST WITH CONTRAST TECHNIQUE: Multidetector CT imaging of the chest was performed using the standard protocol during bolus administration of intravenous contrast. Multiplanar CT image reconstructions and MIPs were obtained to evaluate the vascular anatomy. CONTRAST:  61mL OMNIPAQUE IOHEXOL 350 MG/ML SOLN COMPARISON:  CT chest, 07/07/2019 FINDINGS: Cardiovascular: Satisfactory opacification of the pulmonary arteries to the segmental level. No evidence of pulmonary embolism. Normal heart size. Coronary artery calcifications. No pericardial effusion. Left chest port catheter. Mediastinum/Nodes: Redemonstrated enlarged mediastinal lymph nodes. Thyroid gland, trachea, and esophagus demonstrate no significant findings. Lungs/Pleura: Scattered ground-glass opacities throughout the lungs. Unchanged nodule of the medial right upper lobe measuring 8 mm (series 6, image 30). Fibrotic scarring of the medial right upper lobe and lingula. No pleural effusion or pneumothorax. Upper Abdomen: No acute abnormality. Musculoskeletal: No chest wall abnormality. Numerous lytic and expansile osseous lesions, generally worsened compared to prior examination with increasing soft tissue components. There is a new high-grade wedge deformity of the T8 vertebral body with a substantial posterior soft tissue component intruding into the thecal space (series 8, image 70). Review of the MIP images confirms the above findings. IMPRESSION: 1. Negative  examination for pulmonary embolism. 2. Scattered ground-glass opacities throughout the lungs, nonspecific and infectious or inflammatory. 3. Widespread osseous metastatic disease as seen on prior examination, generally worsened. 4. There is a new high-grade wedge deformity of the T8 vertebral body with a substantial posterior soft tissue component intruding into the thecal space. Correlate for referable symptoms. MRI of the thoracic spine may be used to further evaluate cord compression if desired. 5. Enlarged mediastinal lymph nodes, similar to prior examination. 6. Coronary artery disease. Electronically Signed   By: Eddie Candle M.D.   On: 11/11/2019 20:47        Scheduled Meds:  amphetamine-dextroamphetamine  30 mg Oral Daily   apixaban  5 mg Oral BID   budesonide (PULMICORT) nebulizer solution  0.5 mg Nebulization BID   chlorpheniramine-HYDROcodone  5 mL Oral Q12H   dexamethasone  2 mg Oral Daily   docusate sodium  100 mg Oral BID   feeding supplement (ENSURE ENLIVE)  237 mL Oral BID BM   fentaNYL  1 patch Transdermal Q72H   fluticasone  2 spray Each Nare Daily   influenza vac split quadrivalent PF  0.5 mL Intramuscular Tomorrow-1000   ipratropium  0.5 mg Nebulization Q6H   levalbuterol  0.63 mg Nebulization Q6H   levETIRAcetam  500 mg Oral BID   levofloxacin  750 mg Oral QHS   loratadine  10 mg Oral Daily   magnesium oxide  200 mg Oral Daily   metoprolol succinate  25 mg Oral Daily   multivitamin with minerals  1 tablet Oral Daily   nystatin  500,000 Units Oral QID   OLANZapine  5 mg Oral BID   oxyCODONE  100 mg Oral Q6H   pantoprazole  80 mg Oral Q1200   polyethylene glycol  17 g Oral Daily   sodium chloride flush  3 mL Intravenous Q12H   valACYclovir  500 mg Oral Daily   Continuous Infusions:  sodium chloride       LOS: 1 day    Time spent: 40 minutes    Irine Seal, MD [facility]   To contact the  attending provider  between 7A-7P or the covering provider during after hours 7P-7A, please log into the web site [email or web address] and access using universal [place] password for that web site. If you do not have the password, please call the hospital operator.  11/13/2019, 4:43 PM

## 2019-11-13 DIAGNOSIS — F329 Major depressive disorder, single episode, unspecified: Secondary | ICD-10-CM

## 2019-11-13 DIAGNOSIS — S22060K Wedge compression fracture of T7-T8 vertebra, subsequent encounter for fracture with nonunion: Secondary | ICD-10-CM

## 2019-11-13 DIAGNOSIS — R000 Tachycardia, unspecified: Secondary | ICD-10-CM

## 2019-11-13 DIAGNOSIS — K219 Gastro-esophageal reflux disease without esophagitis: Secondary | ICD-10-CM | POA: Diagnosis present

## 2019-11-13 DIAGNOSIS — D649 Anemia, unspecified: Secondary | ICD-10-CM

## 2019-11-13 DIAGNOSIS — F32A Depression, unspecified: Secondary | ICD-10-CM | POA: Diagnosis present

## 2019-11-13 DIAGNOSIS — S22060A Wedge compression fracture of T7-T8 vertebra, initial encounter for closed fracture: Secondary | ICD-10-CM | POA: Diagnosis present

## 2019-11-13 LAB — CBC WITH DIFFERENTIAL/PLATELET
Abs Immature Granulocytes: 0.06 10*3/uL (ref 0.00–0.07)
Basophils Absolute: 0 10*3/uL (ref 0.0–0.1)
Basophils Relative: 0 %
Eosinophils Absolute: 0 10*3/uL (ref 0.0–0.5)
Eosinophils Relative: 0 %
HCT: 30.7 % — ABNORMAL LOW (ref 36.0–46.0)
Hemoglobin: 9.2 g/dL — ABNORMAL LOW (ref 12.0–15.0)
Immature Granulocytes: 1 %
Lymphocytes Relative: 5 %
Lymphs Abs: 0.4 10*3/uL — ABNORMAL LOW (ref 0.7–4.0)
MCH: 31.5 pg (ref 26.0–34.0)
MCHC: 30 g/dL (ref 30.0–36.0)
MCV: 105.1 fL — ABNORMAL HIGH (ref 80.0–100.0)
Monocytes Absolute: 0.7 10*3/uL (ref 0.1–1.0)
Monocytes Relative: 10 %
Neutro Abs: 6.2 10*3/uL (ref 1.7–7.7)
Neutrophils Relative %: 84 %
Platelets: 188 10*3/uL (ref 150–400)
RBC: 2.92 MIL/uL — ABNORMAL LOW (ref 3.87–5.11)
RDW: 17.2 % — ABNORMAL HIGH (ref 11.5–15.5)
WBC: 7.4 10*3/uL (ref 4.0–10.5)
nRBC: 0.5 % — ABNORMAL HIGH (ref 0.0–0.2)

## 2019-11-13 LAB — BASIC METABOLIC PANEL
Anion gap: 14 (ref 5–15)
BUN: 36 mg/dL — ABNORMAL HIGH (ref 6–20)
CO2: 33 mmol/L — ABNORMAL HIGH (ref 22–32)
Calcium: 9.1 mg/dL (ref 8.9–10.3)
Chloride: 92 mmol/L — ABNORMAL LOW (ref 98–111)
Creatinine, Ser: 0.79 mg/dL (ref 0.44–1.00)
GFR calc Af Amer: >60 mL/min (ref >60)
GFR calc non Af Amer: >60 mL/min (ref >60)
Glucose, Bld: 88 mg/dL (ref 70–99)
Potassium: 4.3 mmol/L (ref 3.5–5.1)
Sodium: 139 mmol/L (ref 135–145)

## 2019-11-13 LAB — MAGNESIUM: Magnesium: 1.8 mg/dL (ref 1.7–2.4)

## 2019-11-13 MED ORDER — LEVOFLOXACIN 750 MG PO TABS
750.0000 mg | ORAL_TABLET | Freq: Every day | ORAL | Status: DC
  Administered 2019-11-13: 750 mg via ORAL
  Filled 2019-11-13: qty 1

## 2019-11-13 MED ORDER — HYDROCOD POLST-CPM POLST ER 10-8 MG/5ML PO SUER
5.0000 mL | Freq: Two times a day (BID) | ORAL | Status: DC
  Administered 2019-11-13 – 2019-11-14 (×4): 5 mL via ORAL
  Filled 2019-11-13 (×4): qty 5

## 2019-11-13 MED ORDER — SODIUM CHLORIDE 0.9 % IV SOLN
500.0000 mg | INTRAVENOUS | Status: DC
  Administered 2019-11-13: 500 mg via INTRAVENOUS
  Filled 2019-11-13: qty 500

## 2019-11-13 MED ORDER — ADULT MULTIVITAMIN W/MINERALS CH
1.0000 | ORAL_TABLET | Freq: Every day | ORAL | Status: DC
  Administered 2019-11-13 – 2019-11-14 (×2): 1 via ORAL
  Filled 2019-11-13 (×2): qty 1

## 2019-11-13 MED ORDER — MAGNESIUM SULFATE 2 GM/50ML IV SOLN
2.0000 g | Freq: Once | INTRAVENOUS | Status: AC
  Administered 2019-11-13: 2 g via INTRAVENOUS
  Filled 2019-11-13: qty 50

## 2019-11-13 MED ORDER — METOPROLOL TARTRATE 5 MG/5ML IV SOLN
5.0000 mg | Freq: Once | INTRAVENOUS | Status: AC
  Administered 2019-11-13: 5 mg via INTRAVENOUS
  Filled 2019-11-13: qty 5

## 2019-11-13 MED ORDER — METOPROLOL SUCCINATE ER 25 MG PO TB24
25.0000 mg | ORAL_TABLET | Freq: Every day | ORAL | Status: DC
  Administered 2019-11-13 – 2019-11-14 (×2): 25 mg via ORAL
  Filled 2019-11-13 (×2): qty 1

## 2019-11-13 NOTE — Progress Notes (Signed)
Initial Nutrition Assessment  DOCUMENTATION CODES:   Underweight  INTERVENTION:   -Ensure Enlive po BID, each supplement provides 350 kcal and 20 grams of protein -Multivitamin with minerals daily  NUTRITION DIAGNOSIS:   Increased nutrient needs related to cancer and cancer related treatments as evidenced by estimated needs.  GOAL:   Patient will meet greater than or equal to 90% of their needs  MONITOR:   PO intake, Supplement acceptance, Labs, Weight trends, I & O's  REASON FOR ASSESSMENT:   Malnutrition Screening Tool    ASSESSMENT:   54 y.o. female with medical history significant of lung cancer with mets to brain, nodes, bones; HTN, Asthma, COPD. Patient was seen by [facility] oncology Sept 15th for infusion chemo. At that time she was diagnosed with bronchitis but w/o abx prescribed. Over the next several days she had increased cough with a productive sputum and progressive SOB.  Pt unavailable at this time. Patient currently consuming 50% of meals at this time. Pt with poor appetite r/t chemotherapy for metastatic lung cancer.  Pt has Ensure supplements ordered and is drinking them.  Per weight history, pt has lost 29 lbs since 5/18 (23% wt loss x 4 months, significant for time frame). Suspect some degree of malnutrition but unable to diagnose at this time. Will attempt at follow-up.  Labs reviewed. Medications: Colace, MAG-OX, Miralax, IV Mg sulfate  NUTRITION - FOCUSED PHYSICAL EXAM:  Unable to complete at this time  Diet Order:   Diet Order            Diet regular Room service appropriate? Yes; Fluid consistency: Thin  Diet effective now                 EDUCATION NEEDS:   No education needs have been identified at this time  Skin:  Skin Assessment: Reviewed RN Assessment  Last BM:  9/18  Height:   Ht Readings from Last 1 Encounters:  11/11/19 5\' 4"  (1.626 m)    Weight:   Wt Readings from Last 1 Encounters:  11/12/19 43.4 kg   BMI:  Body  mass index is 16.42 kg/m.  Estimated Nutritional Needs:   Kcal:  1350-1550  Protein:  65-80g  Fluid:  1.6L/day  Clayton Bibles, MS, RD, LDN Inpatient Clinical Dietitian Contact information available via Amion

## 2019-11-13 NOTE — Progress Notes (Signed)
   11/13/19 0735  Assess: MEWS Score  Temp 98.5 F (36.9 C)  BP (!) 121/91  Pulse Rate (!) 123  Resp 18  SpO2 93 %  O2 Device Nasal Cannula  O2 Flow Rate (L/min) 2 L/min  Assess: MEWS Score  MEWS Temp 0  MEWS Systolic 0  MEWS Pulse 2  MEWS RR 0  MEWS LOC 0  MEWS Score 2  MEWS Score Color Yellow  Assess: if the MEWS score is Yellow or Red  Were vital signs taken at a resting state? Yes  Focused Assessment No change from prior assessment  Early Detection of Sepsis Score *See Row Information* Low  MEWS guidelines implemented *See Row Information* No, previously yellow, continue vital signs every 4 hours  Treat  MEWS Interventions Administered prn meds/treatments  Pain Scale 0-10  Pain Score 5  Pain Type Chronic pain  Pain Location Back  Take Vital Signs  Increase Vital Sign Frequency  Yellow: Q 2hr X 2 then Q 4hr X 2, if remains yellow, continue Q 4hrs (continue Yellow MEWS protocol)  Notify: Charge Nurse/RN  Name of Charge Nurse/RN Notified Daeja RN  Date Charge Nurse/RN Notified 11/13/19  Time Charge Nurse/RN Notified 0800  Notify: Provider  Provider Name/Title Irine Seal MD  Date Provider Notified 11/13/19  Time Provider Notified [phone]  Notification Type Call (EPIC chat)  Notification Reason Other (Comment) (continued tachycardia (MD previously notified))  Response Other (Comment);See new orders (administer PRN pain meds; add metroprolol)  Date of Provider Response 11/13/19  Time of Provider Response 0830  Document  Patient Outcome Other (Comment) (no change in status; HR remains above 100)  Progress note created (see row info) Yes    Off-going RN reported that pt had continously been in Yellow MEWS throughout the evening and MD had been notified of pt status.  Upon completion of am VS, pt remained tachycardic and this RN contacted MD to notify of status.  MD instructed RN to administer PRN pain meds, as well as a one time dose of metoprolol.  Pt remains  stable, asymptomatic.

## 2019-11-13 NOTE — Plan of Care (Signed)
Pt in Yellow MEWS this am.  Off-going RN charted Yellow MEWS assessment.  MD notified this am of continued yellow MEWS. Pt pulse remaining elevated.  All other VS WNL.  Reporting pain 5/10 at this time.   Problem: Education: Goal: Knowledge of General Education information will improve Description: Including pain rating scale, medication(s)/side effects and non-pharmacologic comfort measures Outcome: Progressing   Problem: Health Behavior/Discharge Planning: Goal: Ability to manage health-related needs will improve Outcome: Progressing   Problem: Clinical Measurements: Goal: Ability to maintain clinical measurements within normal limits will improve Outcome: Progressing Goal: Will remain free from infection Outcome: Progressing Goal: Diagnostic test results will improve Outcome: Progressing Goal: Cardiovascular complication will be avoided Outcome: Progressing   Problem: Nutrition: Goal: Adequate nutrition will be maintained Outcome: Progressing   Problem: Coping: Goal: Level of anxiety will decrease Outcome: Progressing   Problem: Elimination: Goal: Will not experience complications related to bowel motility Outcome: Progressing Goal: Will not experience complications related to urinary retention Outcome: Progressing   Problem: Safety: Goal: Ability to remain free from injury will improve Outcome: Progressing   Problem: Skin Integrity: Goal: Risk for impaired skin integrity will decrease Outcome: Progressing   Problem: Clinical Measurements: Goal: Respiratory complications will improve Outcome: Not Progressing   Problem: Activity: Goal: Risk for activity intolerance will decrease Outcome: Not Progressing   Problem: Pain Managment: Goal: General experience of comfort will improve Outcome: Not Progressing

## 2019-11-13 NOTE — Evaluation (Signed)
Occupational Therapy Evaluation Patient Details Name: Ashley Mayo MRN: [id number] DOB: 01/30/1966 Today's Date: 11/13/2019    History of Present Illness Pt admitted with SOB 2* Bronchitis in the setting of COPD/asthma and metastatic Lung CA.     Clinical Impression   Pt admitted with above diagnosis and has the deficits listed below. Pt would benefit from cont OT to increase independence with household tasks and increase knowledge of energy conservation techniques she could use during adls to more easily make it through the day.  Pt lives with family but is alone during the day because they work. Feel pt is safe to be alone at this time as long as she has cell phone with her at all times.  Will continue while on acute care. REc 3:1 for shower as pt needs a shower seat.    Follow Up Recommendations  No OT follow up;Supervision - Intermittent    Equipment Recommendations  3 in 1 bedside commode    Recommendations for Other Services       Precautions / Restrictions Precautions Precautions: Fall Restrictions Weight Bearing Restrictions: No      Mobility Bed Mobility Overal bed mobility: Modified Independent             General bed mobility comments: Pt supine<>sit unassisted  Transfers Overall transfer level: Needs assistance Equipment used: None Transfers: Sit to/from Stand Sit to Stand: Supervision         General transfer comment: safety    Balance Overall balance assessment: Needs assistance Sitting-balance support: Feet supported;No upper extremity supported Sitting balance-Leahy Scale: Good     Standing balance support: No upper extremity supported Standing balance-Leahy Scale: Fair Standing balance comment: Pt states some days are better than others with balance. She uses cane on the bad days.                            ADL either performed or assessed with clinical judgement   ADL Overall ADL's : Needs  assistance/impaired Eating/Feeding: Independent;Sitting   Grooming: Supervision/safety;Sitting   Upper Body Bathing: Set up;Sitting   Lower Body Bathing: Supervison/ safety;Sit to/from stand   Upper Body Dressing : Set up;Sitting   Lower Body Dressing: Supervision/safety;Sit to/from stand   Toilet Transfer: Supervision/safety;Ambulation Toilet Transfer Details (indicate cue type and reason): Pt walked to bathroom with assist for IV only. Toileting- Clothing Manipulation and Hygiene: Supervision/safety;Sit to/from stand       Functional mobility during ADLs: Supervision/safety General ADL Comments: Pt does well overall with adls. pt is very fatigued today and back is painful but pt can complete adls. Pt would benefit from some energy conservation education.     Vision Baseline Vision/History: No visual deficits Patient Visual Report: No change from baseline Vision Assessment?: No apparent visual deficits     Perception Perception Perception Tested?: No   Praxis Praxis Praxis tested?: Within functional limits    Pertinent Vitals/Pain Pain Assessment: Faces Faces Pain Scale: Hurts even more Pain Location: back pain "I have fractures from my CA" Pain Descriptors / Indicators: Aching;Sore Pain Intervention(s): Limited activity within patient's tolerance;Repositioned;Monitored during session     Hand Dominance Right   Extremity/Trunk Assessment Upper Extremity Assessment Upper Extremity Assessment: Generalized weakness   Lower Extremity Assessment Lower Extremity Assessment: Defer to PT evaluation   Cervical / Trunk Assessment Cervical / Trunk Assessment: Other exceptions Cervical / Trunk Exceptions: T8 fx in back   Communication Communication Communication: No difficulties   Cognition  Arousal/Alertness: Awake/alert Behavior During Therapy: WFL for tasks assessed/performed Overall Cognitive Status: Within Functional Limits for tasks assessed                                  General Comments: Pt states she has some mild issues with her memory from chemo but overall does very well.   General Comments  Pt close to baseline adl status. Do feel pt would benefit from energy conservation ed.    Exercises     Shoulder Instructions      Home Living Family/patient expects to be discharged to:: Private residence Living Arrangements: Children Available Help at Discharge: Family Type of Home: House Home Access: Stairs to enter Technical brewer of Steps: 1   Home Layout: Two level;1/2 bath on main level Alternate Level Stairs-Number of Steps: flight Alternate Level Stairs-Rails: Right Bathroom Shower/Tub: Walk-in shower;Door   [employer] Toilet: Standard     Home Equipment: Kasandra Knudsen - single point   Additional Comments: needs a shower seat.      Prior Functioning/Environment Level of Independence: Independent;Independent with assistive device(s)        Comments: Using cane since recent trip over dog        OT Problem List: Decreased activity tolerance;Impaired balance (sitting and/or standing);Decreased cognition;Pain      OT Treatment/Interventions: Self-care/ADL training;Energy conservation;Therapeutic activities    OT Goals(Current goals can be found in the care plan section) Acute Rehab OT Goals Patient Stated Goal: Get off the O2 before DC home OT Goal Formulation: With patient Time For Goal Achievement: 11/27/19 Potential to Achieve Goals: Good ADL Goals Pt Will Perform Tub/Shower Transfer: Shower transfer;3 in 1;ambulating;with modified independence Additional ADL Goal #1: Pt will complete all toileting Ily. Additional ADL Goal #2: pt will state 3 things she can do at home to conserve energy while completing adls Ily.  OT Frequency: Min 2X/week   Barriers to D/C:    Pt is alone during the day and stated there is nobody that can be with her as they all work.       Co-evaluation              AM-PAC  OT "6 Clicks" Daily Activity     Outcome Measure Help from another person eating meals?: None Help from another person taking care of personal grooming?: None Help from another person toileting, which includes using toliet, bedpan, or urinal?: A Little Help from another person bathing (including washing, rinsing, drying)?: None Help from another person to put on and taking off regular upper body clothing?: None Help from another person to put on and taking off regular lower body clothing?: A Little 6 Click Score: 22   End of Session Equipment Utilized During Treatment: Oxygen Nurse Communication: Mobility status  Activity Tolerance: Patient limited by fatigue Patient left: in bed;with call bell/phone within reach  OT Visit Diagnosis: Unsteadiness on feet (R26.81);Other symptoms and signs involving cognitive function                Time: 6283-6629 OT Time Calculation (min): 15 min Charges:  OT General Charges $OT Visit: 1 Visit OT Evaluation $OT Eval Low Complexity: 1 Low  Glenford Peers 11/13/2019, 12:09 PM

## 2019-11-13 NOTE — Progress Notes (Signed)
PT Cancellation Note  Patient Details Name: Ashley Mayo MRN: [id number] DOB: 04/27/1965   Cancelled Treatment:    Reason Eval/Treat Not Completed: Other (comment) (too tired)   [facility] 11/13/2019, 3:46 PM

## 2019-11-14 ENCOUNTER — Inpatient Hospital Stay (HOSPITAL_COMMUNITY): Payer: Medicaid Other

## 2019-11-14 DIAGNOSIS — J189 Pneumonia, unspecified organism: Secondary | ICD-10-CM

## 2019-11-14 LAB — CBC WITH DIFFERENTIAL/PLATELET
Abs Immature Granulocytes: 0.11 10*3/uL — ABNORMAL HIGH (ref 0.00–0.07)
Basophils Absolute: 0 10*3/uL (ref 0.0–0.1)
Basophils Relative: 0 %
Eosinophils Absolute: 0 10*3/uL (ref 0.0–0.5)
Eosinophils Relative: 1 %
HCT: 30.8 % — ABNORMAL LOW (ref 36.0–46.0)
Hemoglobin: 9.4 g/dL — ABNORMAL LOW (ref 12.0–15.0)
Immature Granulocytes: 2 %
Lymphocytes Relative: 10 %
Lymphs Abs: 0.6 10*3/uL — ABNORMAL LOW (ref 0.7–4.0)
MCH: 32.2 pg (ref 26.0–34.0)
MCHC: 30.5 g/dL (ref 30.0–36.0)
MCV: 105.5 fL — ABNORMAL HIGH (ref 80.0–100.0)
Monocytes Absolute: 0.6 10*3/uL (ref 0.1–1.0)
Monocytes Relative: 11 %
Neutro Abs: 4.2 10*3/uL (ref 1.7–7.7)
Neutrophils Relative %: 76 %
Platelets: 201 10*3/uL (ref 150–400)
RBC: 2.92 MIL/uL — ABNORMAL LOW (ref 3.87–5.11)
RDW: 17 % — ABNORMAL HIGH (ref 11.5–15.5)
WBC: 5.6 10*3/uL (ref 4.0–10.5)
nRBC: 0.9 % — ABNORMAL HIGH (ref 0.0–0.2)

## 2019-11-14 LAB — BASIC METABOLIC PANEL
Anion gap: 10 (ref 5–15)
BUN: 39 mg/dL — ABNORMAL HIGH (ref 6–20)
CO2: 36 mmol/L — ABNORMAL HIGH (ref 22–32)
Calcium: 9 mg/dL (ref 8.9–10.3)
Chloride: 92 mmol/L — ABNORMAL LOW (ref 98–111)
Creatinine, Ser: 0.66 mg/dL (ref 0.44–1.00)
GFR calc Af Amer: >60 mL/min (ref >60)
GFR calc non Af Amer: >60 mL/min (ref >60)
Glucose, Bld: 104 mg/dL — ABNORMAL HIGH (ref 70–99)
Potassium: 3.8 mmol/L (ref 3.5–5.1)
Sodium: 138 mmol/L (ref 135–145)

## 2019-11-14 LAB — MAGNESIUM: Magnesium: 1.7 mg/dL (ref 1.7–2.4)

## 2019-11-14 IMAGING — MR MR THORACIC SPINE WO/W CM
8 of 11 series · 32 of 48 positions shown · IV contrast (gadavist)
Comparison: Chest CTA from 3 days ago

CLINICAL DATA: Compression fracture. Stage IV adenocarcinoma of the
lung with spine metastases.

EXAM:
MRI THORACIC WITHOUT AND WITH CONTRAST
TECHNIQUE: Multiplanar and multiecho pulse sequences of the thoracic spine were
obtained without and with intravenous contrast.
CONTRAST:  4mL GADAVIST GADOBUTROL 1 MMOL/ML IV SOLN

[Series 16: T1 · sagittal · 4.0mm · 1.72mm/px · 3 of 16 slices shown (1 of 3)]
[im 1/16]
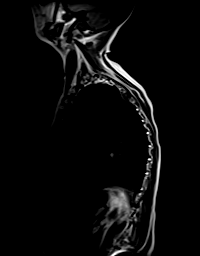
[im 8/16]
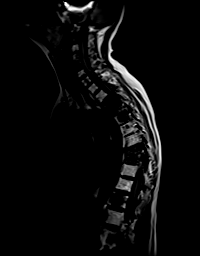
[im 16/16]
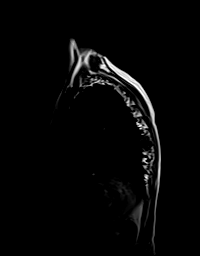

[Series 18: T1 · sagittal · 3.0mm · 0.91mm/px · 3 of 19 slices shown (2 of 3)]
[im 1/19]
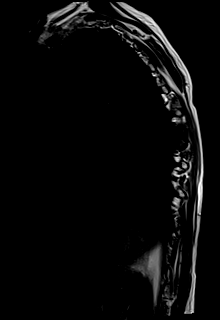
[im 10/19]
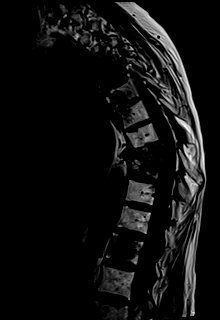
[im 19/19]
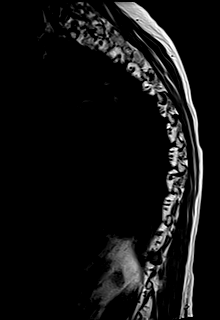

[Series 19: T2 · sagittal · 3.0mm · 0.76mm/px · 3 of 19 slices shown (1 of 4)]
[im 1/19]
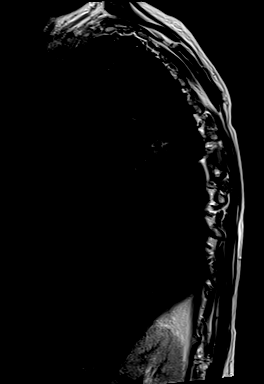
[im 10/19]
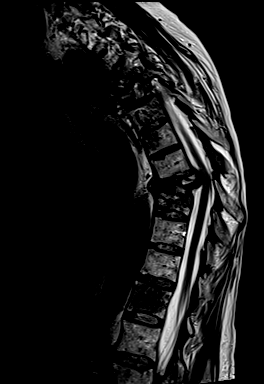
[im 19/19]
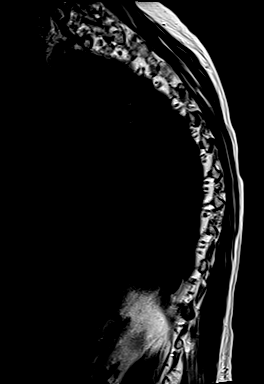

[Series 23: T1 fat-sat post-contrast · sagittal · 3.0mm · 0.91mm/px · 3 of 19 slices shown]
[im 1/19]
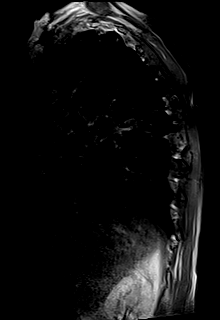
[im 10/19]
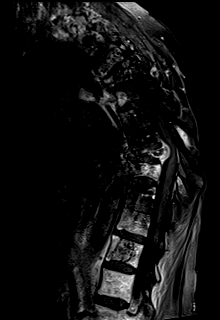
[im 19/19]
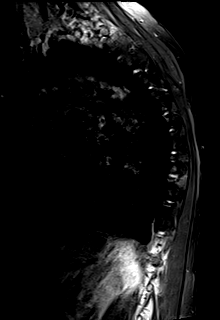

[Series 100: T2 · axial · 4.0mm · 0.78mm/px · 1 of 1 slices shown (2 of 4)]
[im 1/1]
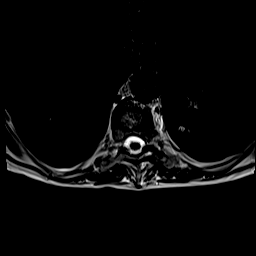

[Series 101: T2 · axial · 4.0mm · 0.39mm/px · z∈[-286,-98]mm · 7 of 39 slices shown (3 of 4)]
[im 1/39]
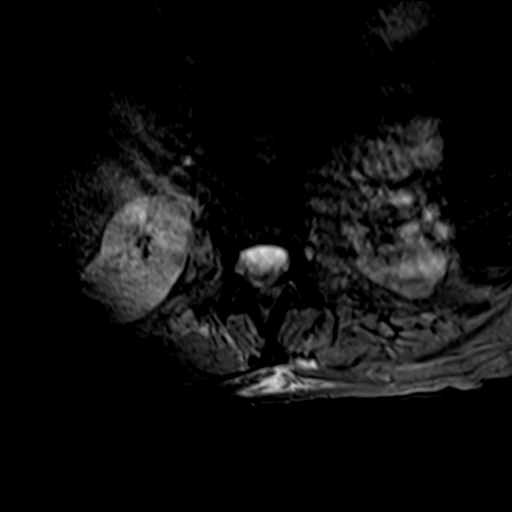
[im 7/39]
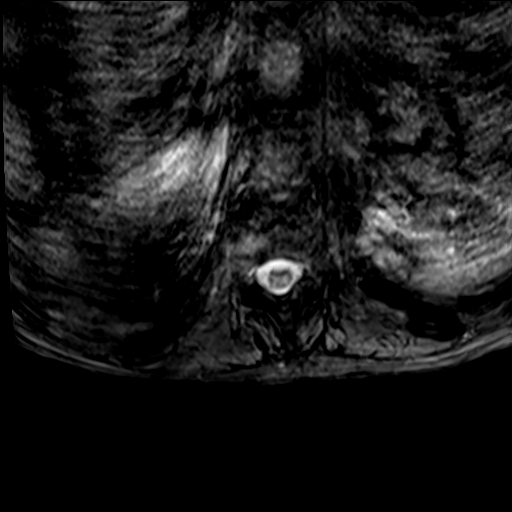
[im 13/39]
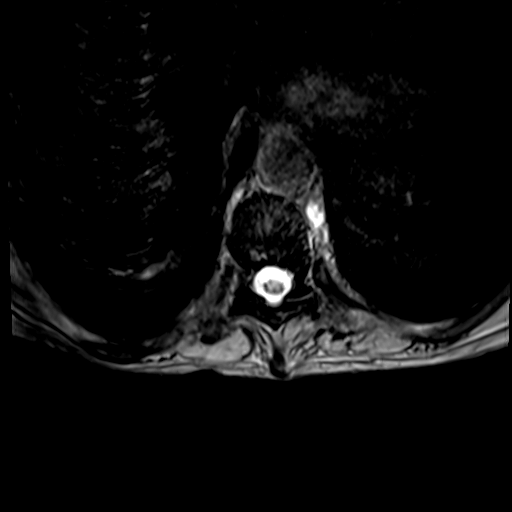
[im 20/39]
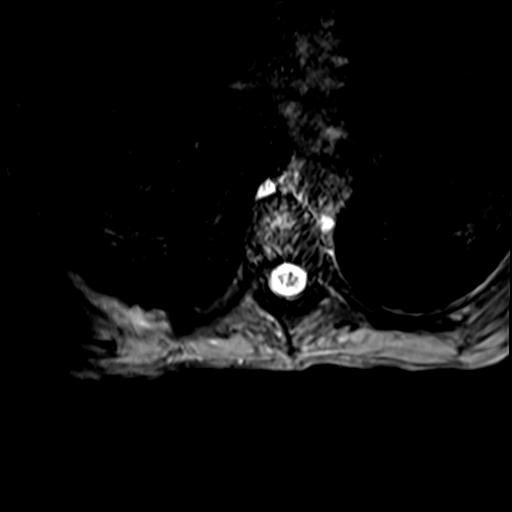
[im 26/39]
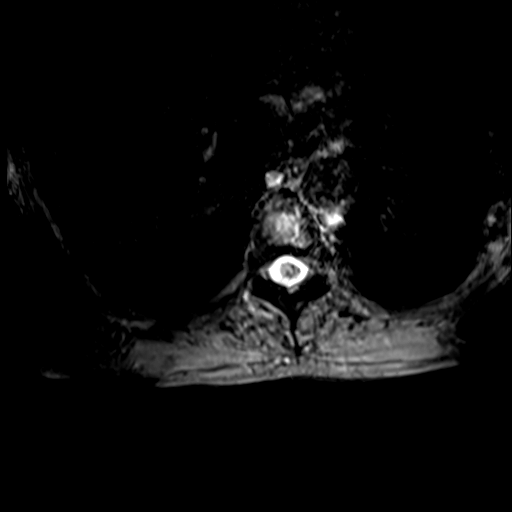
[im 32/39]
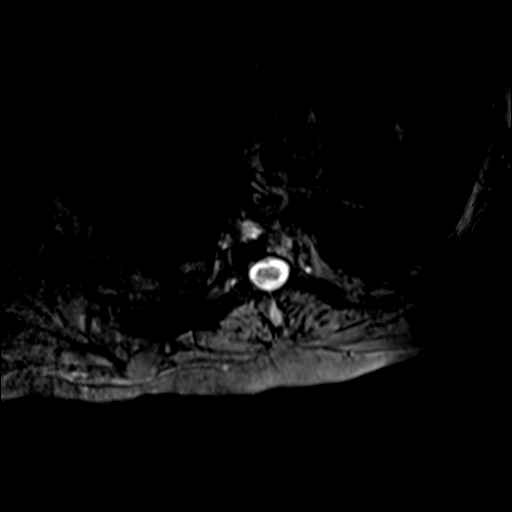
[im 39/39]
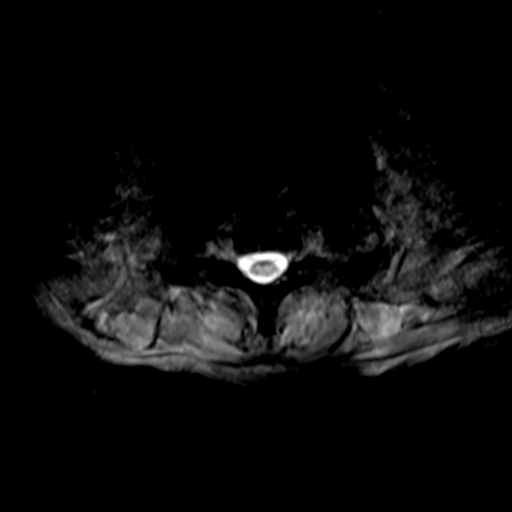

[Series 102: T1 · axial · 4.0mm · 0.39mm/px · z∈[-286,-149]mm · 5 of 39 slices shown (3 of 3)]
[im 1/39]
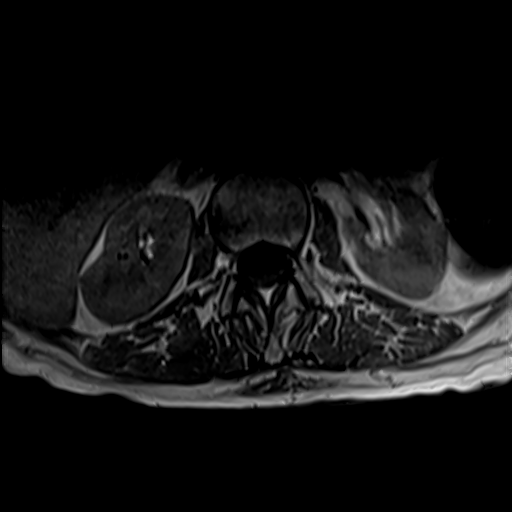
[im 7/39]
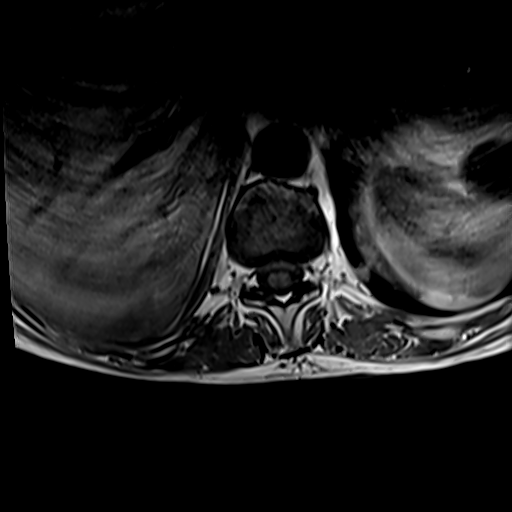
[im 13/39]
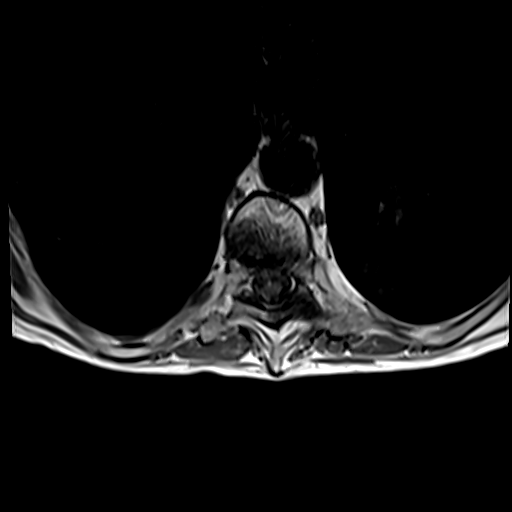
[im 20/39]
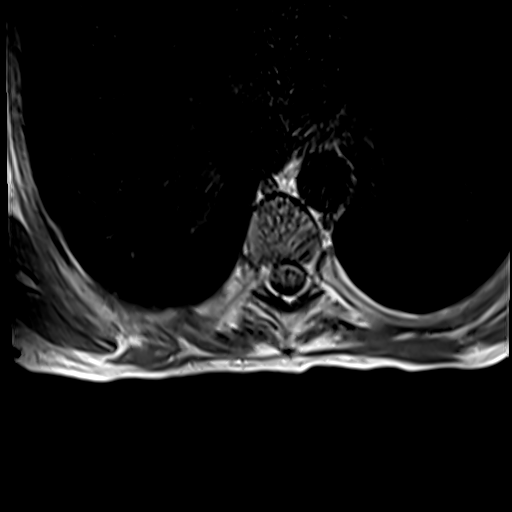
[im 26/39]
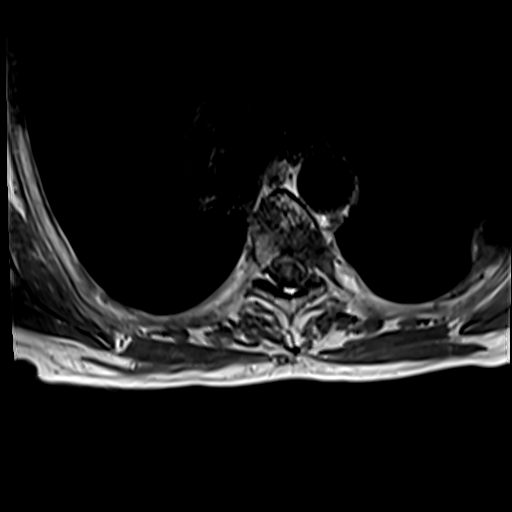

[Series 104: T2 · axial · 4.0mm · 0.78mm/px · z∈[-286,-98]mm · 7 of 39 slices shown (4 of 4)]
[im 1/39]
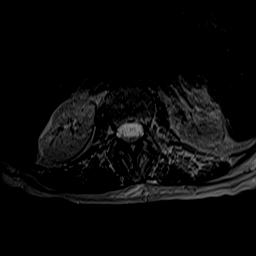
[im 7/39]
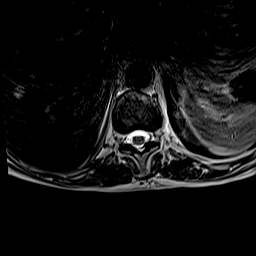
[im 13/39]
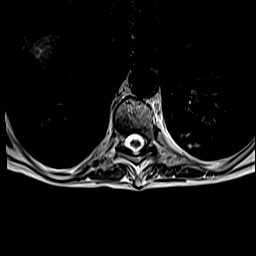
[im 20/39]
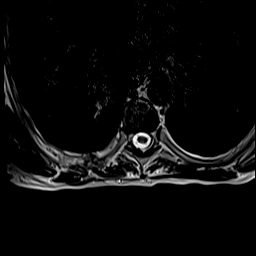
[im 26/39]
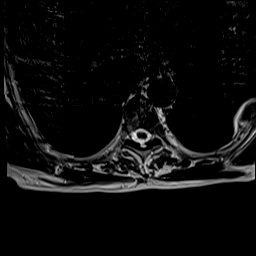
[im 32/39]
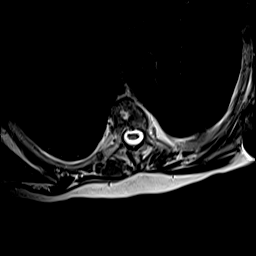
[im 39/39]
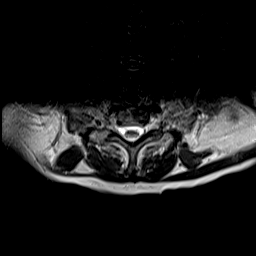

[32 of 48 positions shown; findings below may reference images not displayed]

FINDINGS: MRI THORACIC SPINE FINDINGS

Alignment:  Exaggerated thoracic kyphosis.  Negative for listhesis.

Vertebrae: On sagittal counting sequence of the cervical spine
metastases are seen in the C2, C5, and C7 bodies. No gross
extraosseous tumor at these levels.

In the thoracic spine there are multiple metastatic deposits:

*T2 spinous process with expansion
*T3 body which is diffuse and associated with pathologic compression
fracture causing mild height loss
*T4 body which is diffuse and associated with pathologic fracture
and moderate height loss. Mild retropulsion without cord impingement
*T5 body which is left eccentric and extends into the left pedicle
and articular process
*T6 upper body
*T7 spinous process
*T8 body which is diffuse with compression fracture causing anterior
complete flattening and prominent posterior retropulsion. The
retropulsion compresses the cord. On sagittal postcontrast imaging
there is likely tumor extension along the ventral epidural space
*T9 body extending into the left more than right posterior elements.
*T10 right body and pedicle
*T11 anterior body
*T12 body, multifocal and involving the majority of the vertebra.

Rib lesions are present but not primarily evaluated.

Cord:  No evidence of intrathecal metastasis.

Cord compression as described above.

Paraspinal and other soft tissues: As seen on preceding chest CT

Disc levels:

Ordinary degenerative changes.  No degenerative impingement
IMPRESSION: 1. Widespread osseous metastatic disease to the cervical and
thoracic spine.
2. T8 pathologic body fracture with retropulsion compressing the
cord. Tumor likely infiltrates the ventral epidural space at this
level.
3. T3 and T4 pathologic compression fractures with mild to moderate
height loss.

## 2019-11-14 MED ORDER — MAGNESIUM SULFATE 4 GM/100ML IV SOLN
4.0000 g | Freq: Once | INTRAVENOUS | Status: AC
  Administered 2019-11-14: 4 g via INTRAVENOUS
  Filled 2019-11-14: qty 100

## 2019-11-14 MED ORDER — FLUTICASONE PROPIONATE 50 MCG/ACT NA SUSP: 2.0000 | Freq: Every day | NASAL | 0 refills | Status: AC

## 2019-11-14 MED ORDER — HYDROCOD POLST-CPM POLST ER 10-8 MG/5ML PO SUER
5.0000 mL | Freq: Two times a day (BID) | ORAL | 0 refills | Status: AC
Start: 2019-11-14 — End: 2019-11-19

## 2019-11-14 MED ORDER — BUDESONIDE 0.5 MG/2ML IN SUSP: 0.5000 mg | Freq: Two times a day (BID) | RESPIRATORY_TRACT | 1 refills | Status: DC

## 2019-11-14 MED ORDER — IPRATROPIUM BROMIDE 0.02 % IN SOLN
0.5000 mg | Freq: Three times a day (TID) | RESPIRATORY_TRACT | Status: DC
  Administered 2019-11-14 (×2): 0.5 mg via RESPIRATORY_TRACT
  Filled 2019-11-14 (×2): qty 2.5

## 2019-11-14 MED ORDER — LORATADINE 10 MG PO TABS: 10.0000 mg | ORAL_TABLET | Freq: Every day | ORAL | 1 refills | Status: AC

## 2019-11-14 MED ORDER — IPRATROPIUM BROMIDE 0.02 % IN SOLN: 0.5000 mg | Freq: Three times a day (TID) | RESPIRATORY_TRACT | 1 refills | Status: AC

## 2019-11-14 MED ORDER — METOPROLOL SUCCINATE ER 25 MG PO TB24: 25.0000 mg | ORAL_TABLET | Freq: Every day | ORAL | 1 refills | Status: AC

## 2019-11-14 MED ORDER — GADOBUTROL 1 MMOL/ML IV SOLN
4.0000 mL | Freq: Once | INTRAVENOUS | Status: AC | PRN
  Administered 2019-11-14: 4 mL via INTRAVENOUS

## 2019-11-14 MED ORDER — LEVOFLOXACIN 750 MG PO TABS: 750.0000 mg | ORAL_TABLET | Freq: Every day | ORAL | 0 refills | Status: AC

## 2019-11-14 MED ORDER — LEVALBUTEROL HCL 0.63 MG/3ML IN NEBU
0.6300 mg | INHALATION_SOLUTION | Freq: Three times a day (TID) | RESPIRATORY_TRACT | Status: DC
  Administered 2019-11-14 (×2): 0.63 mg via RESPIRATORY_TRACT
  Filled 2019-11-14 (×2): qty 3

## 2019-11-14 MED ORDER — LEVALBUTEROL HCL 0.63 MG/3ML IN NEBU: 0.6300 mg | INHALATION_SOLUTION | Freq: Three times a day (TID) | RESPIRATORY_TRACT | 12 refills | Status: AC

## 2019-11-14 NOTE — Progress Notes (Signed)
   11/14/19 1224  Assess: MEWS Score  Temp 98.2 F (36.8 C)  BP 109/84  Pulse Rate (!) 130  Resp 18  SpO2 91 %  O2 Device Nasal Cannula  Assess: MEWS Score  MEWS Temp 0  MEWS Systolic 0  MEWS Pulse 3  MEWS RR 0  MEWS LOC 0  MEWS Score 3  MEWS Score Color Yellow  Assess: if the MEWS score is Yellow or Red  Were vital signs taken at a resting state? Yes  Focused Assessment Change from prior assessment (see assessment flowsheet)  Early Detection of Sepsis Score *See Row Information* Low  MEWS guidelines implemented *See Row Information* Yes  Treat  MEWS Interventions Consulted Respiratory Therapy;Administered scheduled meds/treatments  Pain Scale 0-10  Pain Score 0  Take Vital Signs  Increase Vital Sign Frequency  Yellow: Q 2hr X 2 then Q 4hr X 2, if remains yellow, continue Q 4hrs  Escalate  MEWS: Escalate Yellow: discuss with charge nurse/RN and consider discussing with provider and RRT  Notify: Charge Nurse/RN  Name of Charge Nurse/RN Notified KIM OSBORNE  Date Charge Nurse/RN Notified 11/14/19  Time Charge Nurse/RN Notified 28  Notify: Provider  Provider Name/Title Irine Seal, MD  Date Provider Notified 11/14/19  Time Provider Notified 1230  Notification Type Face-to-face  Notification Reason Change in status  Response No new orders  Date of Provider Response 11/14/19  Time of Provider Response [id number]  Document  Patient Outcome Other (Comment) (per provider that is pt's baseline)  Progress note created (see row info) Yes

## 2019-11-14 NOTE — Discharge Summary (Signed)
Physician Discharge Summary  Ashley Mayo:[id number] DOB: [birth year] DOA: 11/11/2019  PCP: Pcp, No  Admit date: 11/11/2019 Discharge date: 11/14/2019  Time spent: 60 minutes  Recommendations for Outpatient Follow-up:  1. Follow-up with PCP in 2 weeks.  On follow-up patient's bronchitis/CAP will need to be reassessed.  Patient will need a basic metabolic profile done to follow-up on electrolytes and renal function. 2. Follow-up with Dr. Nori Riis Ready, oncology as previously scheduled or within 1 week.  On follow-up MRI of the T-spine which was done during his hospitalization will need to be reassessed and further evaluation and recommendations made at that time.   Discharge Diagnoses:  Principal Problem:   COPD with acute bronchitis ([place]) Active Problems:   Community acquired pneumonia   Metastatic lung cancer (metastasis from lung to other site) [facility])   Brain metastasis ([place])   Essential hypertension   Bronchitis   Hypoxemia   Tachycardia   Sinus tachycardia   Closed wedge compression fracture of T8 vertebra ([facility])   Gastroesophageal reflux disease   Anemia   Depression   Discharge Condition: Stable and improved  Diet recommendation: Regular  Filed Weights   11/11/19 1856 11/12/19 0101  Weight: 43.5 kg 43.4 kg    History of present illness:  HPI per Dr Ashley Mayo is a 54 y.o. female with medical history significant of lung cancer with mets to brain, nodes, bones; HTN, Asthma, COPD. Patient was seen by [facility] oncology Sept 15th for infusion chemo. At that time she was diagnosed with bronchitis but w/o abx prescribed. Over the next several days she had increased cough with a productive sputum and progressive SOB. She tried TMP/SMX that was left over from previous illness w/o improvement. Due to increasing SOB she presents to [facility]ED for evaluation   ED Course: Tmax 97.6  130/49  HR 117  RR 20 O2 sat RA 85% improving to 100% with 2 L [place] O2. CTA w/o new PE, scattered  ground glass opacities, new T8 wedge fracture. Cmet with glucose of 145. Lactic acid #1 1.4, #2 1.6. WBC 7.9 with 95/2/2, Hgb 9.9. In ED she received Decadron IV 10 mg, Levaquin 750 mg IV. TRH called to admit for continued treatment of acute bronchitis with hypoxemia.   Hospital Course:  1 COPD with acute bronchitis vs CAP/hypoxemia Patient presented with increasing shortness of breath, increasing productive cough, CT angiogram chest done negative for PE however showed some scattered groundglass opacities.  WBC within normal range post chemo infusion.  Patient with a left shift noted on CBC per admitting physician, concerning for possible underlying bacterial infection.  Patient also noted with an acute hypoxemia.  Patient is immunocompromised host, with increased risk for Pseudomonas infection.  Patient was placed on IV Levaquin which she tolerated and patient subsequently transition to oral Levaquin.  Patient also placed on bronchodilator therapy with Pulmicort, Atrovent, Xopenex nebs.  Patient also was placed on Flonase, Claritin, PPI.  Patient maintained on home regimen of Decadron.  Initially patient placed on Mucinex which was subsequently changed to Tussionex which patient stated helped her better.  Ambulatory sats obtained noted patient with sats of 89% on room air at rest, 80% while ambulating.  Patient be discharged home on 2 L nasal cannula home O2.  Outpatient follow-up with PCP and oncology.  2.  Hypertension Per recent cardiology office visit patient noted to be on Toprol-XL 25 mg daily which was resumed during the hospitalization.   3.  Sinus tachycardia/PVCs Patient noted to have  a history of PVCs and bigeminy being followed by cardiology in the outpatient setting.  Patient during last cardiology visit noted to have heart rates in the 120s.  Per cardiology note patient noted to be on Toprol-XL 25 mg daily which is not on her med rec.patient placed back on home regimen Toprol-XL 25 mg  daily which she will be discharged on.  Outpatient follow-up with cardiology.   4.  History of metastatic lung cancer with mets to the brain, nodes, osseous/cancer related pain Being followed in the outpatient setting at [facility].  Patient maintained on her home pain regimen.  Outpatient follow-up.   5.  New high-grade wedge deformity of T8 vertebral body Noted on CT angiogram chest.  Patient denied any significant weakness in lower extremities, no numbness or tingling, no bowel or bladder incontinence.  States no significant change in her chronic back pain.  MRI of the T-spine was done which showed widespread osseous metastatic disease to the cervical and T-spine.  T8 pathologic body fracture with retropulsion compressing the cord.  Tumor likely infiltrates the ventral epidural space at this level.  T3 and T4 pathologic compression fractures with mild to moderate height loss. Patient stated was having surgery before on her back and had some cardiac issues and as such surgery had to be stopped.  Try to curbside neurosurgery however call was just not returned.  Patient however at this time does not want to entertain any surgeries, is asymptomatic and would like to follow-up with her oncologist in the outpatient setting for further evaluation and management.  Patient be discharged home in stable condition.  Outpatient follow-up.   6.  Anemia Patient with recent chemotherapy.  Anemia likely secondary to chemotherapy.  Patient with no overt bleeding.    Hemoglobin stabilized at 9.4.  Outpatient follow-up.    7.  Gastroesophageal reflux disease Patient maintained on PPI.  8.  Depression Remained stable.  Patient maintained on home regimen Zyprexa.  Outpatient follow-up.    9.  History of migraine headaches Stable.  Imitrex as needed.   Procedures:  MRI T-spine 11/14/2019  CT angiogram chest 11/11/2019  Consultations:  None  Discharge Exam: Vitals:   11/14/19 1224 11/14/19 1425  BP:  109/84 (!) 134/96  Pulse: (!) 130 (!) 133  Resp: 18 18  Temp: 98.2 F (36.8 C) 98.2 F (36.8 C)  SpO2: 91% 94%    General: NAD Cardiovascular: Tachycardia. Respiratory: Some scattered coarse breath sounds.  Minimal expiratory wheezing.  No crackles.  Speaking in full sentences.  Discharge Instructions   Discharge Instructions    Diet general   Complete by: As directed    Increase activity slowly   Complete by: As directed      Allergies as of 11/14/2019      Reactions   Codeine Hives   Shellfish Allergy Hives      Medication List    STOP taking these medications   pantoprazole 40 MG tablet Commonly known as: PROTONIX   potassium chloride 10 MEQ CR capsule Commonly known as: MICRO-K     TAKE these medications   albuterol 108 (90 Base) MCG/ACT inhaler Commonly known as: VENTOLIN HFA Inhale 1-2 puffs into the lungs every 6 (six) hours as needed for wheezing or shortness of breath. What changed: Another medication with the same name was removed. Continue taking this medication, and follow the directions you see here.   amphetamine-dextroamphetamine 30 MG 24 hr capsule Commonly known as: ADDERALL XR Take 1 capsule by mouth  daily.   budesonide 0.5 MG/2ML nebulizer solution Commonly known as: PULMICORT Take 2 mLs (0.5 mg total) by nebulization 2 (two) times daily for 5 days.   carisoprodol 350 MG tablet Commonly known as: SOMA Take 350 mg by mouth 4 (four) times daily as needed.   chlorpheniramine-HYDROcodone 10-8 MG/5ML Suer Commonly known as: TUSSIONEX Take 5 mLs by mouth every 12 (twelve) hours for 5 days.   dexamethasone 2 MG tablet Commonly known as: DECADRON Take 1 tablet by mouth daily.   docusate sodium 100 MG capsule Commonly known as: COLACE Take 1 capsule (100 mg total) by mouth 2 (two) times daily.   Eliquis 5 MG Tabs tablet Generic drug: apixaban Take 5 mg by mouth 2 (two) times daily.   esomeprazole 40 MG capsule Commonly known as:  NEXIUM Take 40 mg by mouth daily.   feeding supplement (ENSURE ENLIVE) Liqd Take 237 mLs by mouth 2 (two) times daily between meals.   fentaNYL 100 MCG/HR Commonly known as: [place] 1 patch onto the skin every 3 (three) days.   fluticasone 50 MCG/ACT nasal spray Commonly known as: FLONASE Place 2 sprays into both nostrils daily. Start taking on: November 15, 2019   furosemide 20 MG tablet Commonly known as: LASIX Take 20 mg by mouth daily as needed for fluid.   ipratropium 0.02 % nebulizer solution Commonly known as: ATROVENT Take 2.5 mLs (0.5 mg total) by nebulization 3 (three) times daily. Use 3 times daily x5 days, then every 6 hours as needed.   levalbuterol 0.63 MG/3ML nebulizer solution Commonly known as: XOPENEX Take 3 mLs (0.63 mg total) by nebulization 3 (three) times daily. Use 3 times daily x5 days, then every 6 hours as needed.   levETIRAcetam 500 MG tablet Commonly known as: KEPPRA Take 1 tablet (500 mg total) by mouth 2 (two) times daily.   levofloxacin 750 MG tablet Commonly known as: LEVAQUIN Take 1 tablet (750 mg total) by mouth at bedtime for 4 days.   lidocaine 2 % solution Commonly known as: XYLOCAINE Use as directed 15 mLs in the mouth or throat every 6 (six) hours as needed for mouth pain.   lidocaine 5 % Commonly known as: LIDODERM Place 1-2 patches onto the skin daily as needed (for back pain).   loratadine 10 MG tablet Commonly known as: CLARITIN Take 1 tablet (10 mg total) by mouth daily. Start taking on: November 15, 2019   Magnesium 200 MG Tabs Take 1 tablet (200 mg total) by mouth daily.   metoprolol succinate 25 MG 24 hr tablet Commonly known as: TOPROL-XL Take 1 tablet (25 mg total) by mouth daily. Start taking on: November 15, 2019   multivitamin with minerals Tabs tablet Take 1 tablet by mouth daily.   nystatin 100000 UNIT/ML suspension Commonly known as: MYCOSTATIN Take 5 mLs (500,000 Units total) by mouth 4  (four) times daily.   OLANZapine 5 MG tablet Commonly known as: ZYPREXA Take 5 mg by mouth 2 (two) times daily.   ondansetron 8 MG tablet Commonly known as: ZOFRAN Take 8 mg by mouth every 8 (eight) hours as needed for nausea or vomiting.   oxycodone 30 MG immediate release tablet Commonly known as: ROXICODONE Take 2 tablets by mouth every 3 (three) hours as needed for other. PRN cancer pain   oxyCODONE 80 mg 12 hr tablet Commonly known as: OXYCONTIN Take 1 tablet by mouth every 6 (six) hours. Take w/ oxycodone 20mg  for total dose of 100mg    oxyCODONE  20 mg 12 hr tablet Commonly known as: OXYCONTIN Take 1 tablet by mouth every 6 (six) hours. Take w/ oxycodone 80mg  for total dose of 100 mg   polyethylene glycol 17 g packet Commonly known as: MIRALAX / GLYCOLAX Take 17 g by mouth daily.   SUMAtriptan 6 MG/0.5ML Soln injection Commonly known as: IMITREX Inject 6 mg into the skin every 2 (two) hours as needed for migraine.   valACYclovir 500 MG tablet Commonly known as: VALTREX Take 500 mg by mouth daily.            Durable Medical Equipment  (From admission, onward)         Start     Ordered   11/14/19 1619  For home use only DME oxygen  Once       Question Answer Comment  Length of Need Lifetime   Mode or (Route) Nasal cannula   Liters per Minute 2   Frequency Continuous (stationary and portable oxygen unit needed)   Oxygen conserving device Yes   Oxygen delivery system Gas      11/14/19 1618         Allergies  Allergen Reactions  . Codeine Hives  . Shellfish Allergy Hives    Follow-up Information    PCP. Schedule an appointment as soon as possible for a visit in 2 week(s).        Ready, Nori Riis, MD. Schedule an appointment as soon as possible for a visit in 1 week(s).   Specialty: Internal Medicine Why: Follow-up as scheduled or within 1 week. Contact information: [facility] [address] [place] [address] [phone]                 The results of significant diagnostics from this hospitalization (including imaging, microbiology, ancillary and laboratory) are listed below for reference.    Significant Diagnostic Studies: CT Angio Chest PE W and/or Wo Contrast  Result Date: 11/11/2019 CLINICAL DATA:  PE suspected, shortness of breath, tachycardia, history of metastatic cancer EXAM: CT ANGIOGRAPHY CHEST WITH CONTRAST TECHNIQUE: Multidetector CT imaging of the chest was performed using the standard protocol during bolus administration of intravenous contrast. Multiplanar CT image reconstructions and MIPs were obtained to evaluate the vascular anatomy. CONTRAST:  45mL OMNIPAQUE IOHEXOL 350 MG/ML SOLN COMPARISON:  CT chest, 07/07/2019 FINDINGS: Cardiovascular: Satisfactory opacification of the pulmonary arteries to the segmental level. No evidence of pulmonary embolism. Normal heart size. Coronary artery calcifications. No pericardial effusion. Left chest port catheter. Mediastinum/Nodes: Redemonstrated enlarged mediastinal lymph nodes. Thyroid gland, trachea, and esophagus demonstrate no significant findings. Lungs/Pleura: Scattered ground-glass opacities throughout the lungs. Unchanged nodule of the medial right upper lobe measuring 8 mm (series 6, image 30). Fibrotic scarring of the medial right upper lobe and lingula. No pleural effusion or pneumothorax. Upper Abdomen: No acute abnormality. Musculoskeletal: No chest wall abnormality. Numerous lytic and expansile osseous lesions, generally worsened compared to prior examination with increasing soft tissue components. There is a new high-grade wedge deformity of the T8 vertebral body with a substantial posterior soft tissue component intruding into the thecal space (series 8, image 70). Review of the MIP images confirms the above findings. IMPRESSION: 1. Negative examination for pulmonary embolism. 2. Scattered ground-glass opacities throughout the lungs, nonspecific and infectious  or inflammatory. 3. Widespread osseous metastatic disease as seen on prior examination, generally worsened. 4. There is a new high-grade wedge deformity of the T8 vertebral body with a substantial posterior soft tissue component intruding into the thecal  space. Correlate for referable symptoms. MRI of the thoracic spine may be used to further evaluate cord compression if desired. 5. Enlarged mediastinal lymph nodes, similar to prior examination. 6. Coronary artery disease. Electronically Signed   By: Eddie Candle M.D.   On: 11/11/2019 20:47   MR THORACIC SPINE W WO CONTRAST  Result Date: 11/14/2019 CLINICAL DATA:  Compression fracture. 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 EXAM: MRI THORACIC WITHOUT AND WITH CONTRAST TECHNIQUE: Multiplanar and multiecho pulse sequences of the thoracic spine were obtained without and with intravenous contrast. CONTRAST:  51mL GADAVIST GADOBUTROL 1 MMOL/ML IV SOLN COMPARISON:  Chest CTA from 3 days ago FINDINGS: MRI THORACIC SPINE FINDINGS Alignment:  Exaggerated thoracic kyphosis.  Negative for listhesis. Vertebrae: On sagittal counting sequence of the cervical spine metastases are seen in the C2, C5, and C7 bodies. No gross extraosseous tumor at these levels. In the thoracic spine there are multiple metastatic deposits: *T2 spinous process with expansion *T3 body which is diffuse and associated with pathologic compression fracture causing mild height loss *T4 body which is diffuse and associated with pathologic fracture and moderate height loss. Mild retropulsion without cord impingement *T5 body which is left eccentric and extends into the left pedicle and articular process *T6 upper body *T7 spinous process *T8 body which is diffuse with compression fracture causing anterior complete flattening and prominent posterior retropulsion. The retropulsion compresses the cord. On sagittal postcontrast imaging there is likely tumor extension along the ventral  epidural space *T9 body extending into the left more than right posterior elements. *T10 right body and pedicle *T11 anterior body *T12 body, multifocal and involving the majority of the vertebra. Rib lesions are present but not primarily evaluated. Cord:  No evidence of intrathecal metastasis. Cord compression as described above. Paraspinal and other soft tissues: As seen on preceding chest CT Disc levels: Ordinary degenerative changes.  No degenerative impingement IMPRESSION: 1. Widespread osseous metastatic disease to the cervical and thoracic spine. 2. T8 pathologic body fracture with retropulsion compressing the cord. Tumor likely infiltrates the ventral epidural space at this level. 3. T3 and T4 pathologic compression fractures with mild to moderate height loss. Electronically Signed   By: Monte Fantasia M.D.   On: 11/14/2019 08:46    Microbiology: Recent Results (from the past 240 hour(s))  SARS Coronavirus 2 by RT PCR (hospital order, performed in [facility] hospital lab) Nasopharyngeal Nasopharyngeal Swab     Status: None   Collection Time: 11/11/19  7:30 PM   Specimen: Nasopharyngeal Swab  Result Value Ref Range Status   SARS Coronavirus 2 NEGATIVE NEGATIVE Final    Comment: (NOTE) SARS-CoV-2 target nucleic acids are NOT DETECTED.  The SARS-CoV-2 RNA is generally detectable in upper and lower respiratory specimens during the acute phase of infection. The lowest concentration of SARS-CoV-2 viral copies this assay can detect is 250 copies / mL. A negative result does not preclude SARS-CoV-2 infection and should not be used as the sole basis for treatment or other patient management decisions.  A negative result may occur with improper specimen collection / handling, submission of specimen other than nasopharyngeal swab, presence of viral mutation(s) within the areas targeted by this assay, and inadequate number of viral copies (<250 copies / mL). A negative result must be combined  with clinical observations, patient history, and epidemiological information.  Fact Sheet for Patients:   StrictlyIdeas.no  Fact Sheet for Healthcare Providers: BankingDealers.co.za  This test is not yet approved or  cleared  by the [place] and has been authorized for detection and/or diagnosis of SARS-CoV-2 by FDA under an Emergency Use Authorization (EUA).  This EUA will remain in effect (meaning this test can be used) for the duration of the COVID-19 declaration under Section 564(b)(1) of the Act, 21 U.S.C. section 360bbb-3(b)(1), unless the authorization is terminated or revoked sooner.  Performed at [facility], [place] [address], [place], [place] [address]   Blood culture (routine x 2)     Status: None (Preliminary result)   Collection Time: 11/11/19  7:32 PM   Specimen: BLOOD LEFT FOREARM  Result Value Ref Range Status   Specimen Description   Final    BLOOD LEFT FOREARM Performed at [facility], [place] [address] [place], [place] [address]    Special Requests   Final    BOTTLES DRAWN AEROBIC AND ANAEROBIC Blood Culture results may not be optimal due to an inadequate volume of blood received in culture bottles Performed at [place] [address], [place], [place] [address]    Culture   Final    NO GROWTH 3 DAYS Performed at [facility], [place] [address], [place], [place] [address]    Report Status PENDING  Incomplete  Blood culture (routine x 2)     Status: None (Preliminary result)   Collection Time: 11/11/19  7:37 PM   Specimen: BLOOD  Result Value Ref Range Status   Specimen Description   Final    BLOOD RIGHT ANTECUBITAL Performed at [place] [address], [place], [place] [address]    Special Requests   Final    BOTTLES DRAWN AEROBIC AND ANAEROBIC Blood Culture adequate volume Performed at [place]  [address], [place], [place] [address]    Culture   Final    NO GROWTH 3 DAYS Performed at [facility], [place] [address] [place], [place] [address]    Report Status PENDING  Incomplete     Labs: Basic Metabolic Panel: Recent Labs  Lab 11/11/19 1921 11/12/19 0900 11/13/19 0539 11/14/19 0526  NA 138 137 139 138  K 5.0 4.6 4.3 3.8  CL 94* 94* 92* 92*  CO2 30 30 33* 36*  GLUCOSE 145* 94 88 104*  BUN 29* 31* 36* 39*  CREATININE 0.82 0.83 0.79 0.66  CALCIUM 9.6 8.9 9.1 9.0  MG  --  1.5* 1.8 1.7   Liver Function Tests: No results for input(s): AST, ALT, ALKPHOS, BILITOT, PROT, ALBUMIN in the last 168 hours. No results for input(s): LIPASE, AMYLASE in the last 168 hours. No results for input(s): AMMONIA in the last 168 hours. CBC: Recent Labs  Lab 11/11/19 1921 11/12/19 0900 11/13/19 0539 11/14/19 0526  WBC 7.9 3.7* 7.4 5.6  NEUTROABS 7.6 3.0 6.2 4.2  HGB 9.9* 9.0* 9.2* 9.4*  HCT 31.8* 29.7* 30.7* 30.8*  MCV 101.9* 104.2* 105.1* 105.5*  PLT 221 200 188 201   Cardiac Enzymes: No results for input(s): CKTOTAL, CKMB, CKMBINDEX, TROPONINI in the last 168 hours. BNP: BNP (last 3 results) No results for input(s): BNP in the last 8760 hours.  ProBNP (last 3 results) No results for input(s): PROBNP in the last 8760 hours.  CBG: No results for input(s): GLUCAP in the last 168 hours.     Signed:  Irine Seal MD.  [facility] 11/14/2019, 4:36 PM

## 2019-11-14 NOTE — Progress Notes (Signed)
Physical Therapy Treatment Patient Details Name: Ashley Mayo MRN: [id number] DOB: [birth year] Today's Date: 11/14/2019    History of Present Illness Pt admitted with SOB 2* Bronchitis in the setting of COPD/asthma and metastatic Lung CA.      PT Comments    Pt assisted with ambulating in hallway and requires supplemental oxygen.  Pt would prefer not to d/c home with oxygen however aware she needs it at this time.  Pt encouraged to ambulate with nursing as well.  SATURATION QUALIFICATIONS: (This note is used to comply with regulatory documentation for home oxygen)  Patient Saturations on Room Air at Rest = 89%  Patient Saturations on Room Air while Ambulating = 80%  Patient Saturations on 2 Liters of oxygen while Ambulating = 94%  Please briefly explain why patient needs home oxygen: to improve oxygen saturations with physical activities such as ambulation.   Follow Up Recommendations  No PT follow up     Equipment Recommendations  None recommended by PT    Recommendations for Other Services       Precautions / Restrictions Precautions Precautions: Fall Precaution Comments: monitor sats    Mobility  Bed Mobility Overal bed mobility: Modified Independent                Transfers Overall transfer level: Needs assistance Equipment used: None Transfers: Sit to/from Stand Sit to Stand: Supervision            Ambulation/Gait Ambulation/Gait assistance: Supervision Gait Distance (Feet): 500 Feet Assistive device: IV Pole Gait Pattern/deviations: Step-through pattern;Decreased stride length     General Gait Details: verbal cues for breathing, required supplemental oxygen   Stairs             Wheelchair Mobility    Modified Rankin (Stroke Patients Only)       Balance                                            Cognition Arousal/Alertness: Awake/alert Behavior During Therapy: WFL for tasks assessed/performed Overall  Cognitive Status: Within Functional Limits for tasks assessed                                        Exercises      General Comments        Pertinent Vitals/Pain Pain Assessment: No/denies pain    Home Living                      Prior Function            PT Goals (current goals can now be found in the care plan section) Progress towards PT goals: Progressing toward goals    Frequency    Min 3X/week      PT Plan Current plan remains appropriate    Co-evaluation              AM-PAC PT "6 Clicks" Mobility   Outcome Measure  Help needed turning from your back to your side while in a flat bed without using bedrails?: None Help needed moving from lying on your back to sitting on the side of a flat bed without using bedrails?: None Help needed moving to and from a bed to a chair (including a wheelchair)?: None Help needed standing  up from a chair using your arms (e.g., wheelchair or bedside chair)?: A Little Help needed to walk in hospital room?: A Little Help needed climbing 3-5 steps with a railing? : A Little 6 Click Score: 21    End of Session Equipment Utilized During Treatment: Oxygen Activity Tolerance: Patient tolerated treatment well Patient left: in bed;with call bell/phone within reach Nurse Communication: Mobility status PT Visit Diagnosis: Difficulty in walking, not elsewhere classified (R26.2)     Time: 1003-1020 PT Time Calculation (min) (ACUTE ONLY): 17 min  Charges:  $Gait Training: 8-22 mins                     Arlyce Dice, DPT Acute Rehabilitation Services Pager: [phone] Office: [phone]  [place] Ram E 11/14/2019, 11:39 AM

## 2019-11-14 NOTE — TOC Transition Note (Signed)
Transition of Care [facility]) - CM/SW Discharge Note   Patient Details  Name: Ashley Mayo MRN: [id number] Date of Birth: [birth year]  Transition of Care [facility]) CM/SW Contact:  Lynnell Catalan, RN Phone Number: 11/14/2019, 12:54 PM   Clinical Narrative:     Pt with home 02 order. Desat screen charted by physical therapy. This CM contacted [employer] liaison for home 02. RN made aware that 02 to be delivered to pt room.

## 2019-11-16 LAB — CULTURE, BLOOD (ROUTINE X 2)
Culture: NO GROWTH
Culture: NO GROWTH
Special Requests: ADEQUATE

## 2020-02-26 ENCOUNTER — Other Ambulatory Visit: Payer: Self-pay

## 2020-02-26 ENCOUNTER — Inpatient Hospital Stay (HOSPITAL_COMMUNITY)
Admission: EM | Admit: 2020-02-26 | Discharge: 2020-03-26 | DRG: 951 | Disposition: E | Payer: Medicaid Other | Attending: Internal Medicine | Admitting: Internal Medicine

## 2020-02-26 DIAGNOSIS — R64 Cachexia: Secondary | ICD-10-CM | POA: Diagnosis present

## 2020-02-26 DIAGNOSIS — R059 Cough, unspecified: Secondary | ICD-10-CM

## 2020-02-26 DIAGNOSIS — Z87891 Personal history of nicotine dependence: Secondary | ICD-10-CM

## 2020-02-26 DIAGNOSIS — D649 Anemia, unspecified: Secondary | ICD-10-CM | POA: Diagnosis present

## 2020-02-26 DIAGNOSIS — K219 Gastro-esophageal reflux disease without esophagitis: Secondary | ICD-10-CM | POA: Diagnosis present

## 2020-02-26 DIAGNOSIS — R627 Adult failure to thrive: Secondary | ICD-10-CM | POA: Diagnosis present

## 2020-02-26 DIAGNOSIS — Z7951 Long term (current) use of inhaled steroids: Secondary | ICD-10-CM

## 2020-02-26 DIAGNOSIS — T451X5A Adverse effect of antineoplastic and immunosuppressive drugs, initial encounter: Secondary | ICD-10-CM | POA: Diagnosis present

## 2020-02-26 DIAGNOSIS — Z86711 Personal history of pulmonary embolism: Secondary | ICD-10-CM

## 2020-02-26 DIAGNOSIS — Z66 Do not resuscitate: Secondary | ICD-10-CM | POA: Diagnosis present

## 2020-02-26 DIAGNOSIS — D6481 Anemia due to antineoplastic chemotherapy: Secondary | ICD-10-CM | POA: Diagnosis present

## 2020-02-26 DIAGNOSIS — D6959 Other secondary thrombocytopenia: Secondary | ICD-10-CM | POA: Diagnosis present

## 2020-02-26 DIAGNOSIS — D696 Thrombocytopenia, unspecified: Secondary | ICD-10-CM | POA: Diagnosis present

## 2020-02-26 DIAGNOSIS — W19XXXA Unspecified fall, initial encounter: Secondary | ICD-10-CM | POA: Diagnosis present

## 2020-02-26 DIAGNOSIS — Y92009 Unspecified place in unspecified non-institutional (private) residence as the place of occurrence of the external cause: Secondary | ICD-10-CM

## 2020-02-26 DIAGNOSIS — C787 Secondary malignant neoplasm of liver and intrahepatic bile duct: Secondary | ICD-10-CM | POA: Diagnosis present

## 2020-02-26 DIAGNOSIS — Z91013 Allergy to seafood: Secondary | ICD-10-CM

## 2020-02-26 DIAGNOSIS — R54 Age-related physical debility: Secondary | ICD-10-CM | POA: Diagnosis present

## 2020-02-26 DIAGNOSIS — J9601 Acute respiratory failure with hypoxia: Secondary | ICD-10-CM | POA: Diagnosis present

## 2020-02-26 DIAGNOSIS — Z681 Body mass index (BMI) 19 or less, adult: Secondary | ICD-10-CM

## 2020-02-26 DIAGNOSIS — J449 Chronic obstructive pulmonary disease, unspecified: Secondary | ICD-10-CM | POA: Diagnosis present

## 2020-02-26 DIAGNOSIS — M8448XA Pathological fracture, other site, initial encounter for fracture: Secondary | ICD-10-CM | POA: Diagnosis present

## 2020-02-26 DIAGNOSIS — C3490 Malignant neoplasm of unspecified part of unspecified bronchus or lung: Secondary | ICD-10-CM | POA: Diagnosis present

## 2020-02-26 DIAGNOSIS — Z79899 Other long term (current) drug therapy: Secondary | ICD-10-CM

## 2020-02-26 DIAGNOSIS — Z885 Allergy status to narcotic agent status: Secondary | ICD-10-CM

## 2020-02-26 DIAGNOSIS — G43909 Migraine, unspecified, not intractable, without status migrainosus: Secondary | ICD-10-CM | POA: Diagnosis present

## 2020-02-26 DIAGNOSIS — I10 Essential (primary) hypertension: Secondary | ICD-10-CM | POA: Diagnosis present

## 2020-02-26 DIAGNOSIS — Z7901 Long term (current) use of anticoagulants: Secondary | ICD-10-CM

## 2020-02-26 DIAGNOSIS — C7951 Secondary malignant neoplasm of bone: Secondary | ICD-10-CM | POA: Diagnosis present

## 2020-02-26 DIAGNOSIS — Z803 Family history of malignant neoplasm of breast: Secondary | ICD-10-CM

## 2020-02-26 DIAGNOSIS — Z20822 Contact with and (suspected) exposure to covid-19: Secondary | ICD-10-CM | POA: Diagnosis present

## 2020-02-26 DIAGNOSIS — C7931 Secondary malignant neoplasm of brain: Secondary | ICD-10-CM | POA: Diagnosis present

## 2020-02-26 DIAGNOSIS — Z515 Encounter for palliative care: Principal | ICD-10-CM

## 2020-02-27 ENCOUNTER — Emergency Department (HOSPITAL_COMMUNITY): Payer: Medicaid Other

## 2020-02-27 ENCOUNTER — Encounter (HOSPITAL_COMMUNITY): Payer: Self-pay | Admitting: Emergency Medicine

## 2020-02-27 DIAGNOSIS — Z7901 Long term (current) use of anticoagulants: Secondary | ICD-10-CM | POA: Diagnosis not present

## 2020-02-27 DIAGNOSIS — R059 Cough, unspecified: Secondary | ICD-10-CM | POA: Diagnosis present

## 2020-02-27 DIAGNOSIS — D6959 Other secondary thrombocytopenia: Secondary | ICD-10-CM | POA: Diagnosis present

## 2020-02-27 DIAGNOSIS — C787 Secondary malignant neoplasm of liver and intrahepatic bile duct: Secondary | ICD-10-CM | POA: Diagnosis present

## 2020-02-27 DIAGNOSIS — C7951 Secondary malignant neoplasm of bone: Secondary | ICD-10-CM | POA: Diagnosis present

## 2020-02-27 DIAGNOSIS — Z86711 Personal history of pulmonary embolism: Secondary | ICD-10-CM | POA: Diagnosis not present

## 2020-02-27 DIAGNOSIS — G43909 Migraine, unspecified, not intractable, without status migrainosus: Secondary | ICD-10-CM | POA: Diagnosis present

## 2020-02-27 DIAGNOSIS — Y92009 Unspecified place in unspecified non-institutional (private) residence as the place of occurrence of the external cause: Secondary | ICD-10-CM | POA: Diagnosis not present

## 2020-02-27 DIAGNOSIS — R64 Cachexia: Secondary | ICD-10-CM | POA: Diagnosis present

## 2020-02-27 DIAGNOSIS — Z66 Do not resuscitate: Secondary | ICD-10-CM | POA: Diagnosis present

## 2020-02-27 DIAGNOSIS — Z79899 Other long term (current) drug therapy: Secondary | ICD-10-CM | POA: Diagnosis not present

## 2020-02-27 DIAGNOSIS — Z20822 Contact with and (suspected) exposure to covid-19: Secondary | ICD-10-CM | POA: Diagnosis present

## 2020-02-27 DIAGNOSIS — R627 Adult failure to thrive: Secondary | ICD-10-CM | POA: Diagnosis present

## 2020-02-27 DIAGNOSIS — Z7951 Long term (current) use of inhaled steroids: Secondary | ICD-10-CM | POA: Diagnosis not present

## 2020-02-27 DIAGNOSIS — W19XXXA Unspecified fall, initial encounter: Secondary | ICD-10-CM | POA: Diagnosis present

## 2020-02-27 DIAGNOSIS — J449 Chronic obstructive pulmonary disease, unspecified: Secondary | ICD-10-CM | POA: Diagnosis present

## 2020-02-27 DIAGNOSIS — C3492 Malignant neoplasm of unspecified part of left bronchus or lung: Secondary | ICD-10-CM | POA: Diagnosis not present

## 2020-02-27 DIAGNOSIS — C3490 Malignant neoplasm of unspecified part of unspecified bronchus or lung: Secondary | ICD-10-CM | POA: Diagnosis present

## 2020-02-27 DIAGNOSIS — T451X5A Adverse effect of antineoplastic and immunosuppressive drugs, initial encounter: Secondary | ICD-10-CM | POA: Diagnosis present

## 2020-02-27 DIAGNOSIS — I10 Essential (primary) hypertension: Secondary | ICD-10-CM | POA: Diagnosis present

## 2020-02-27 DIAGNOSIS — J9601 Acute respiratory failure with hypoxia: Secondary | ICD-10-CM | POA: Diagnosis present

## 2020-02-27 DIAGNOSIS — Z681 Body mass index (BMI) 19 or less, adult: Secondary | ICD-10-CM | POA: Diagnosis not present

## 2020-02-27 DIAGNOSIS — C7931 Secondary malignant neoplasm of brain: Secondary | ICD-10-CM | POA: Diagnosis present

## 2020-02-27 DIAGNOSIS — M8448XA Pathological fracture, other site, initial encounter for fracture: Secondary | ICD-10-CM | POA: Diagnosis present

## 2020-02-27 DIAGNOSIS — Z803 Family history of malignant neoplasm of breast: Secondary | ICD-10-CM | POA: Diagnosis not present

## 2020-02-27 DIAGNOSIS — Z515 Encounter for palliative care: Secondary | ICD-10-CM | POA: Diagnosis not present

## 2020-02-27 DIAGNOSIS — K219 Gastro-esophageal reflux disease without esophagitis: Secondary | ICD-10-CM | POA: Diagnosis present

## 2020-02-27 DIAGNOSIS — D6481 Anemia due to antineoplastic chemotherapy: Secondary | ICD-10-CM | POA: Diagnosis present

## 2020-02-27 LAB — CBC WITH DIFFERENTIAL/PLATELET
Abs Immature Granulocytes: 0.23 10*3/uL — ABNORMAL HIGH (ref 0.00–0.07)
Basophils Absolute: 0 10*3/uL (ref 0.0–0.1)
Basophils Relative: 0 %
Eosinophils Absolute: 0 10*3/uL (ref 0.0–0.5)
Eosinophils Relative: 0 %
HCT: 23.2 % — ABNORMAL LOW (ref 36.0–46.0)
Hemoglobin: 6.8 g/dL — CL (ref 12.0–15.0)
Immature Granulocytes: 2 %
Lymphocytes Relative: 1 %
Lymphs Abs: 0.2 10*3/uL — ABNORMAL LOW (ref 0.7–4.0)
MCH: 33.5 pg (ref 26.0–34.0)
MCHC: 29.3 g/dL — ABNORMAL LOW (ref 30.0–36.0)
MCV: 114.3 fL — ABNORMAL HIGH (ref 80.0–100.0)
Monocytes Absolute: 0.7 10*3/uL (ref 0.1–1.0)
Monocytes Relative: 5 %
Neutro Abs: 12.8 10*3/uL — ABNORMAL HIGH (ref 1.7–7.7)
Neutrophils Relative %: 92 %
Platelets: 7 10*3/uL — CL (ref 150–400)
RBC: 2.03 MIL/uL — ABNORMAL LOW (ref 3.87–5.11)
RDW: 32.2 % — ABNORMAL HIGH (ref 11.5–15.5)
WBC: 13.9 10*3/uL — ABNORMAL HIGH (ref 4.0–10.5)
nRBC: 13.2 % — ABNORMAL HIGH (ref 0.0–0.2)

## 2020-02-27 LAB — COMPREHENSIVE METABOLIC PANEL
ALT: 153 U/L — ABNORMAL HIGH (ref 0–44)
AST: 254 U/L — ABNORMAL HIGH (ref 15–41)
Albumin: 2.4 g/dL — ABNORMAL LOW (ref 3.5–5.0)
Alkaline Phosphatase: 1040 U/L — ABNORMAL HIGH (ref 38–126)
Anion gap: 17 — ABNORMAL HIGH (ref 5–15)
BUN: 70 mg/dL — ABNORMAL HIGH (ref 6–20)
CO2: 25 mmol/L (ref 22–32)
Calcium: 7.8 mg/dL — ABNORMAL LOW (ref 8.9–10.3)
Chloride: 88 mmol/L — ABNORMAL LOW (ref 98–111)
Creatinine, Ser: 1.46 mg/dL — ABNORMAL HIGH (ref 0.44–1.00)
GFR, Estimated: 42 mL/min — ABNORMAL LOW (ref >60)
Glucose, Bld: 94 mg/dL (ref 70–99)
Potassium: 4.6 mmol/L (ref 3.5–5.1)
Sodium: 130 mmol/L — ABNORMAL LOW (ref 135–145)
Total Bilirubin: 4 mg/dL — ABNORMAL HIGH (ref 0.3–1.2)
Total Protein: 5.4 g/dL — ABNORMAL LOW (ref 6.5–8.1)

## 2020-02-27 LAB — POC OCCULT BLOOD, ED: Fecal Occult Bld: NEGATIVE

## 2020-02-27 LAB — RESP PANEL BY RT-PCR (FLU A&B, COVID) ARPGX2
Influenza A by PCR: NEGATIVE
Influenza B by PCR: NEGATIVE
SARS Coronavirus 2 by RT PCR: NEGATIVE

## 2020-02-27 LAB — PREPARE RBC (CROSSMATCH)

## 2020-02-27 IMAGING — CT CT HEAD W/O CM
2 of 3 series · 11 of 47 positions shown, 13 images · non-contrast
Comparison: None. Findings are correlated with prior MRI of the
thoracic spine of [DATE]
COMPARISON: None. Findings are correlated with prior MRI of the
thoracic spine of [DATE]

Addendum:
CLINICAL DATA: Metastatic lung cancer, fall, cervical spine
fracture

EXAM:
CT HEAD WITHOUT CONTRAST
CT CERVICAL SPINE WITHOUT CONTRAST
TECHNIQUE: Multidetector CT imaging of the head and cervical spine was
performed following the standard protocol without intravenous
contrast. Multiplanar CT image reconstructions of the cervical spine
were also generated.

[Series 3: head wo · axial · 0.47mm/px · z∈[-150,-25]mm · 8 of 31 slices shown, 10 images]
[im 3/31  brain]
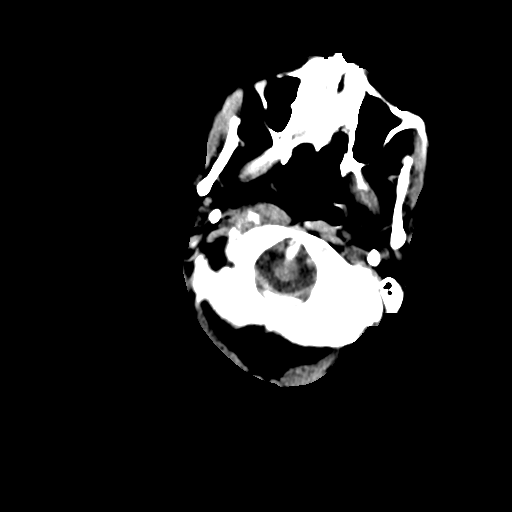
[im 3/31  bone]
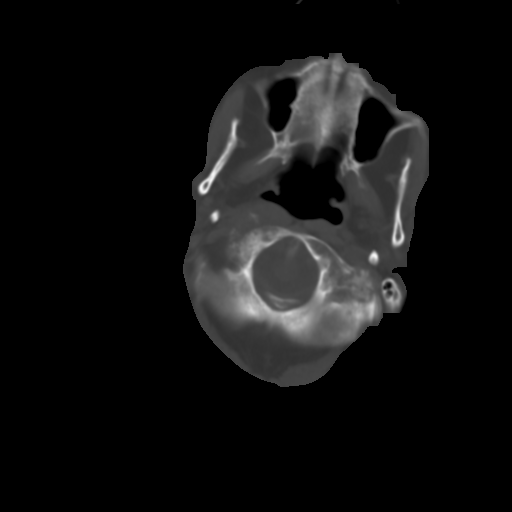
[im 7/31  brain]
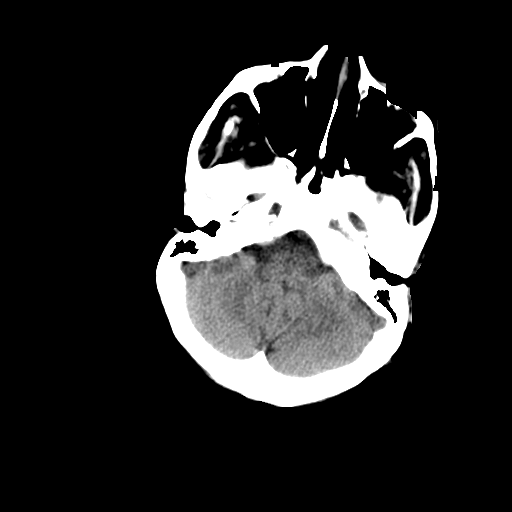
[im 10/31  brain]
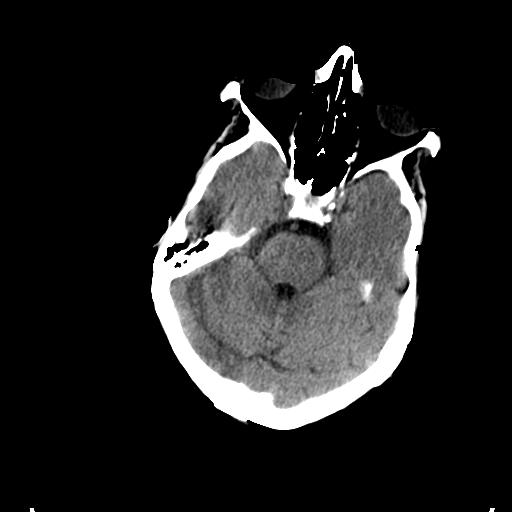
[im 14/31  brain]
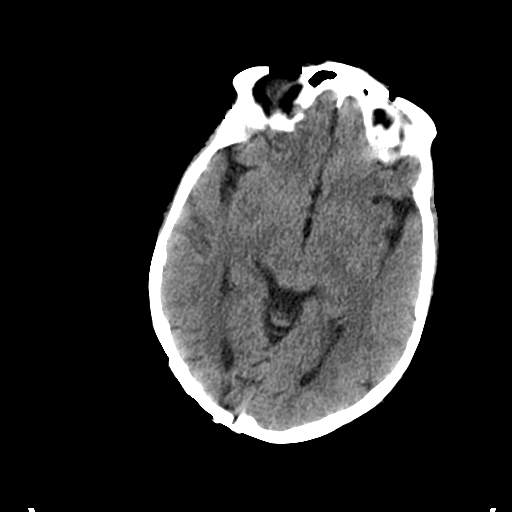
[im 17/31  brain]
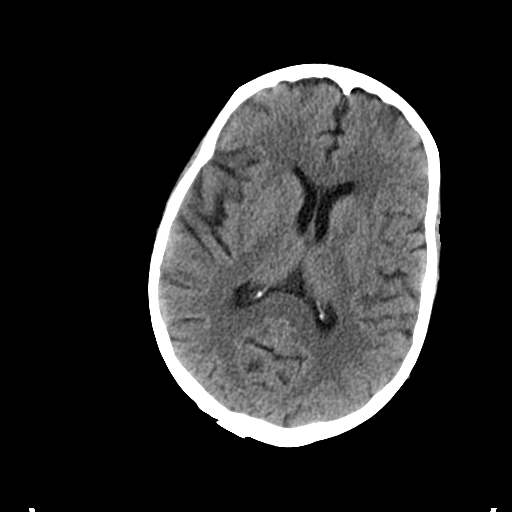
[im 17/31  bone]
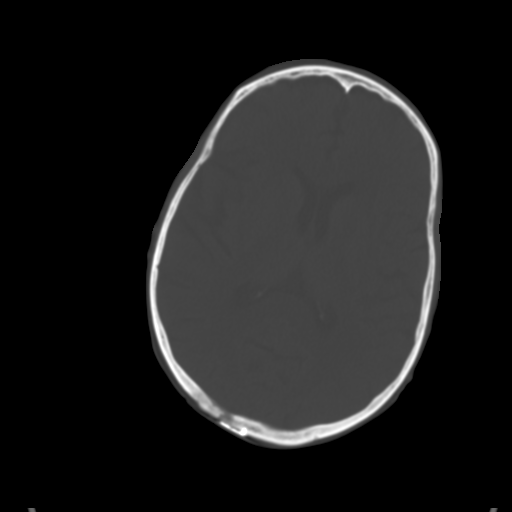
[im 21/31  brain]
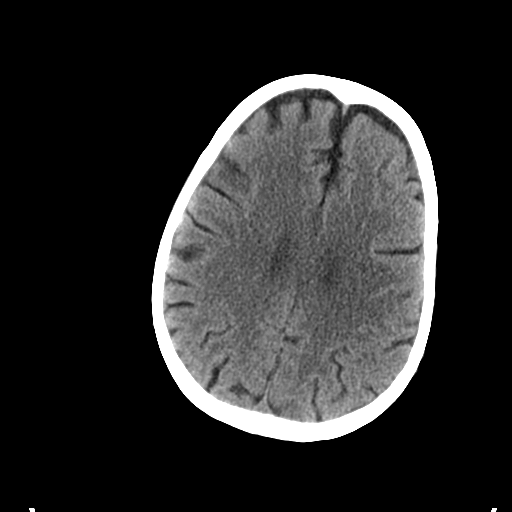
[im 24/31  brain]
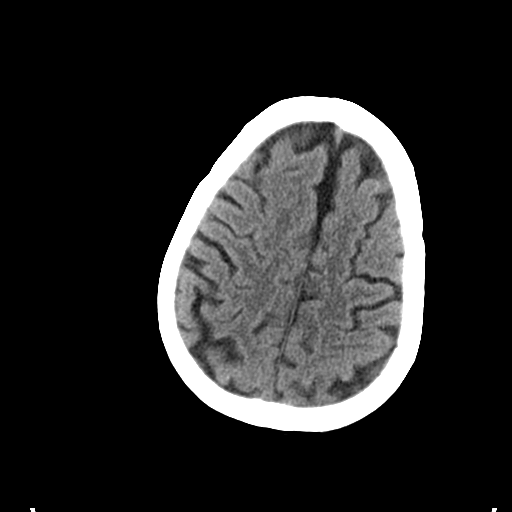
[im 28/31  brain]
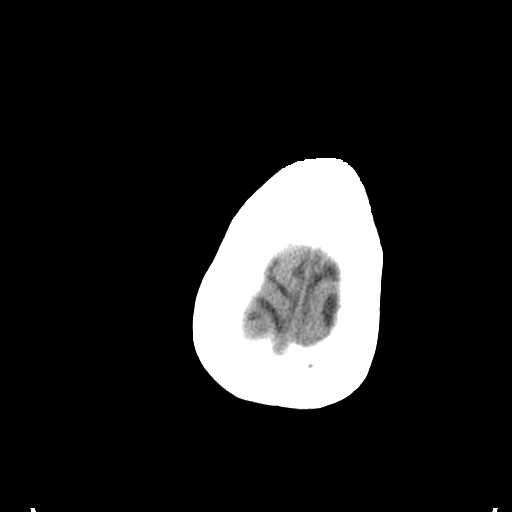

[Series 5: coronal soft tissue · coronal · 0.29mm/px · 3 of 63 slices shown]
[im 21/63  brain]
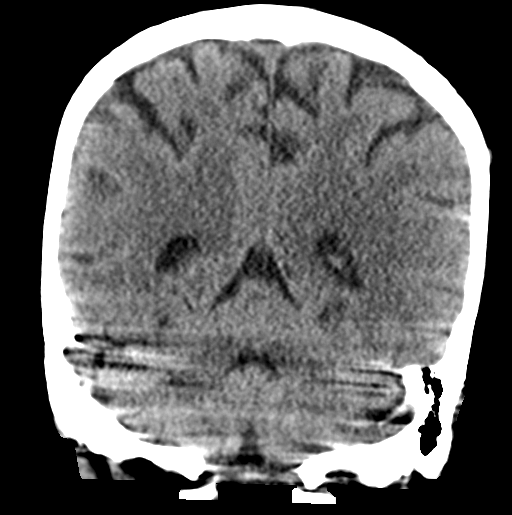
[im 28/63  brain]
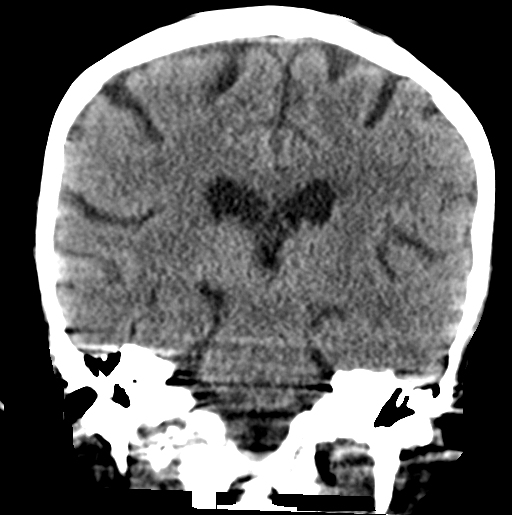
[im 35/63  brain]
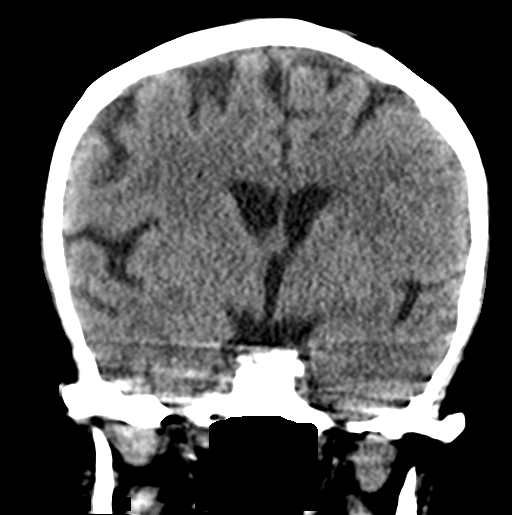

[11 of 47 positions shown; findings below may reference images not displayed]

FINDINGS: CT HEAD FINDINGS

Brain: Right occipital craniotomy has been performed with a small
amount of focal encephalomalacia subjacent to the craniotomy flap.
No evidence of acute intracranial hemorrhage or infarct. No abnormal
mass effect or midline shift. No abnormal intra or extra-axial mass
lesion or fluid collection. Ventricular size is normal. Cerebellum
is unremarkable.

Vascular: No asymmetric hyperdense vasculature at the skull base.

Skull: Lytic lesions are seen involving the a skull base adjacent to
the jugular foramen and intimately of all vein the left stylomastoid
foramen and right occipital condyle compatible with metastatic
osseous implants in this patient with known metastatic lung cancer.
Additional metastatic implants noted within the right clivus
subjacent to the foramen lacerum. No acute fracture.

Sinuses/Orbits: The paranasal sinuses are clear. The orbits are
unremarkable.

Other: Mastoid air cells and middle ear cavities are clear.

CT CERVICAL SPINE FINDINGS

Alignment: Normal cervical lordosis.  No listhesis.

Skull base and vertebrae: Multiple osseous lytic lesions are
identified:

There is a lytic lesion involving the right occipital condyle
without associated pathologic fracture.

Lytic lesion is seen within the body of C2 extending into the right
lateral body of C2 and right pedicle of C2. There is associated
pathologic fracture of the right body of C2 with compression and
impingement of the a transverse foramen.

Lytic lesion within the body of C3 without associated compression.

Lytic lesion within the body of C4 with associated right paracentral
compression fracture without associated retropulsion. The posterior
elements appear intact.

Lytic lesion within the body of C5 with left paracentral compression
fracture with approximately 80% loss of height. The posterior
elements are not involved.

Mixed lytic and sclerotic metastasis within the body of C7 without
associated pathologic fracture.

Sclerotic metastases are again seen within the T3 and T4 vertebral
bodies with associated compression fractures which appear
progressive since prior MRI examination. Minimal retropulsion at T4,
similar to prior examination. The spinal canal appears widely
patent.

Soft tissues and spinal canal: No prevertebral fluid or swelling. No
visible canal hematoma.

Disc levels: Intervertebral disc spaces are preserved. The
paraspinal soft tissues are unremarkable.

Upper chest: Multiple osseous metastases are seen within the a
visualized thoracic cage and thoracic spine. Masslike consolidation
noted within the right apex.

Other: Left internal jugular chest port partially visualized.
IMPRESSION: No acute intracranial abnormality.  No calvarial fracture.

Widespread osseous metastatic disease within the skull base and
cervical spine. Pathologic fractures of the a right lateral body of
C2, C4, and C5 which appear new since prior MRI examination.
Fracture of C2 impinges the transverse foramen though the adjacent
vertebral artery is not well assessed on this noncontrast
examination. Lack of surrounding soft tissue swelling at this level
suggests that this may be subacute to remote in etiology. No
associated listhesis.

Attempts are being made at this time to contact the managing
clinician for direct communication of the critical findings.

ADDENDUM:
These results were called by telephone at the time of interpretation
on [DATE] at [DATE] to provider KOOTJE , who verbally
acknowledged these results.

*** End of Addendum ***
FINDINGS: CT HEAD FINDINGS

Brain: Right occipital craniotomy has been performed with a small
amount of focal encephalomalacia subjacent to the craniotomy flap.
No evidence of acute intracranial hemorrhage or infarct. No abnormal
mass effect or midline shift. No abnormal intra or extra-axial mass
lesion or fluid collection. Ventricular size is normal. Cerebellum
is unremarkable.

Vascular: No asymmetric hyperdense vasculature at the skull base.

Skull: Lytic lesions are seen involving the a skull base adjacent to
the jugular foramen and intimately of all vein the left stylomastoid
foramen and right occipital condyle compatible with metastatic
osseous implants in this patient with known metastatic lung cancer.
Additional metastatic implants noted within the right clivus
subjacent to the foramen lacerum. No acute fracture.

Sinuses/Orbits: The paranasal sinuses are clear. The orbits are
unremarkable.

Other: Mastoid air cells and middle ear cavities are clear.

CT CERVICAL SPINE FINDINGS

Alignment: Normal cervical lordosis.  No listhesis.

Skull base and vertebrae: Multiple osseous lytic lesions are
identified:

There is a lytic lesion involving the right occipital condyle
without associated pathologic fracture.

Lytic lesion is seen within the body of C2 extending into the right
lateral body of C2 and right pedicle of C2. There is associated
pathologic fracture of the right body of C2 with compression and
impingement of the a transverse foramen.

Lytic lesion within the body of C3 without associated compression.

Lytic lesion within the body of C4 with associated right paracentral
compression fracture without associated retropulsion. The posterior
elements appear intact.

Lytic lesion within the body of C5 with left paracentral compression
fracture with approximately 80% loss of height. The posterior
elements are not involved.

Mixed lytic and sclerotic metastasis within the body of C7 without
associated pathologic fracture.

Sclerotic metastases are again seen within the T3 and T4 vertebral
bodies with associated compression fractures which appear
progressive since prior MRI examination. Minimal retropulsion at T4,
similar to prior examination. The spinal canal appears widely
patent.

Soft tissues and spinal canal: No prevertebral fluid or swelling. No
visible canal hematoma.

Disc levels: Intervertebral disc spaces are preserved. The
paraspinal soft tissues are unremarkable.

Upper chest: Multiple osseous metastases are seen within the a
visualized thoracic cage and thoracic spine. Masslike consolidation
noted within the right apex.

Other: Left internal jugular chest port partially visualized.
IMPRESSION: No acute intracranial abnormality.  No calvarial fracture.

Widespread osseous metastatic disease within the skull base and
cervical spine. Pathologic fractures of the a right lateral body of
C2, C4, and C5 which appear new since prior MRI examination.
Fracture of C2 impinges the transverse foramen though the adjacent
vertebral artery is not well assessed on this noncontrast
examination. Lack of surrounding soft tissue swelling at this level
suggests that this may be subacute to remote in etiology. No
associated listhesis.

Attempts are being made at this time to contact the managing
clinician for direct communication of the critical findings.

## 2020-02-27 IMAGING — DX DG CHEST 1V PORT
1 series · 1 of 1 positions shown · non-contrast
Comparison: Prior CT from [DATE]

CLINICAL DATA: Initial evaluation for acute cough, history of lung
cancer.

EXAM:
PORTABLE CHEST 1 VIEW

[chest ap]
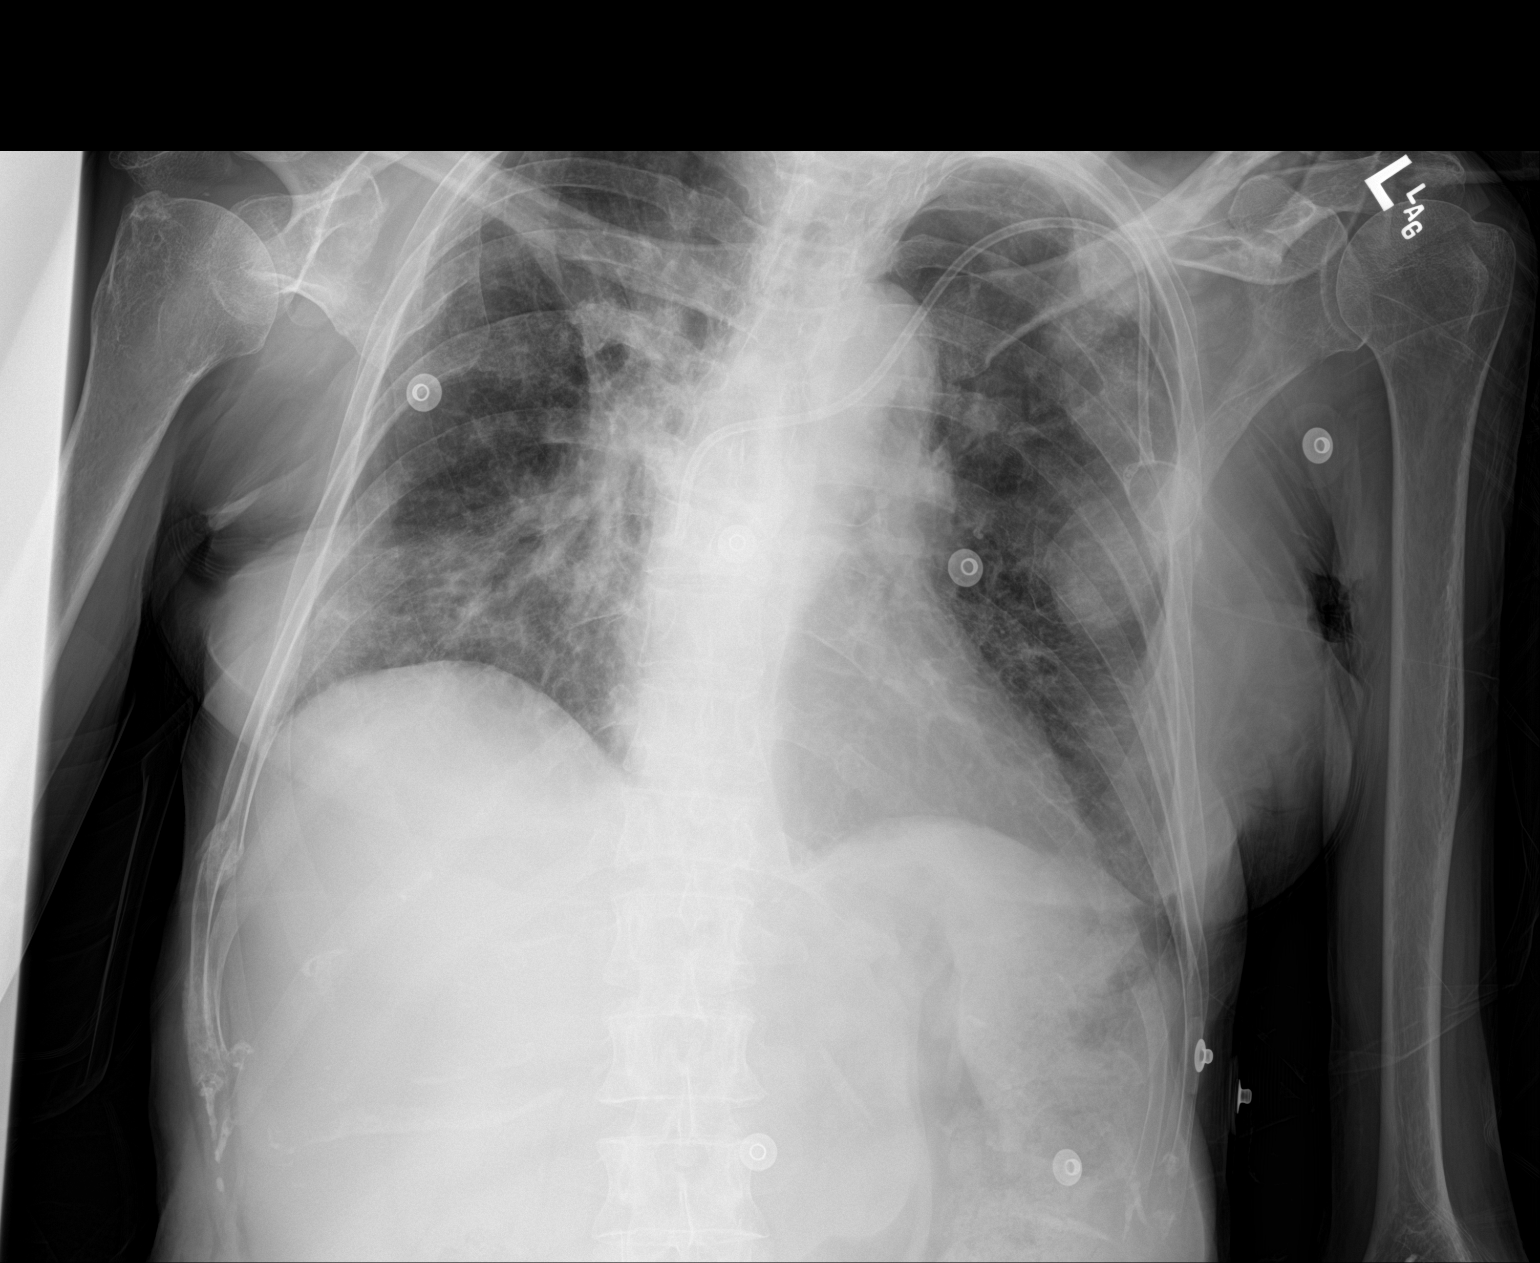

[1 of 1 positions shown; findings below may reference images not displayed]

FINDINGS: Left-sided Port-A-Cath in place with tip overlying the distal SVC.
Heart size within normal limits. Mediastinal silhouette normal.

Lungs hypoinflated. Approximate 4.5 cm nodular density overlying the
mid left lung consistent with known metastatic disease. Few
additional pleural based and/or osseous metastases noted, also seen
on prior CT. Irregular pleuroparenchymal scarring and/or
architectural distortion present at the right perihilar region.
Patchy and hazy opacity at the right lung base could reflect
atelectasis or infiltrate. No pulmonary edema or pleural effusion.
No pneumothorax.

Probable healing pathologic fracture noted at the right lower ribs.
IMPRESSION: 1. Patchy and hazy opacity at the right lung base, which could
reflect atelectasis or infiltrate.
2. 4.5 cm nodular density overlying the mid left lung, consistent
with known metastatic disease. Few additional pleural based and/or
osseous metastases noted as well, also seen on prior CT.
3. Probable healing pathologic fracture at the right lower ribs.

## 2020-02-27 IMAGING — CT CT CERVICAL SPINE W/O CM
3 of 4 series · 9 of 33 positions shown, 11 images · non-contrast
Comparison: None. Findings are correlated with prior MRI of the
thoracic spine of [DATE]
COMPARISON: None. Findings are correlated with prior MRI of the
thoracic spine of [DATE]

Addendum:
CLINICAL DATA: Metastatic lung cancer, fall, cervical spine
fracture

EXAM:
CT HEAD WITHOUT CONTRAST
CT CERVICAL SPINE WITHOUT CONTRAST
TECHNIQUE: Multidetector CT imaging of the head and cervical spine was
performed following the standard protocol without intravenous
contrast. Multiplanar CT image reconstructions of the cervical spine
were also generated.

[Series 4: c spine soft · axial · 0.37mm/px · 1 of 80 slices shown]
[im 14/80  soft-tissue]
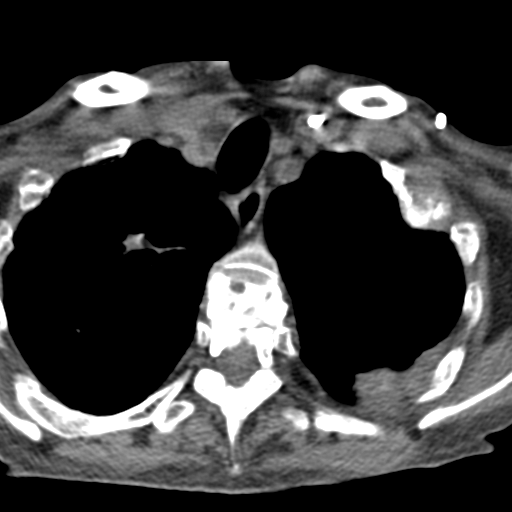

[Series 6: orthogonal bone · axial · 0.19mm/px · z∈[-298,-195]mm · 5 of 89 slices shown, 7 images]
[im 15/89  soft-tissue]
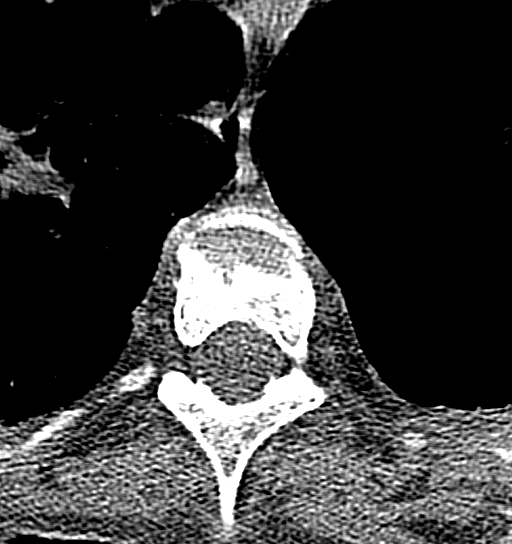
[im 15/89  bone]
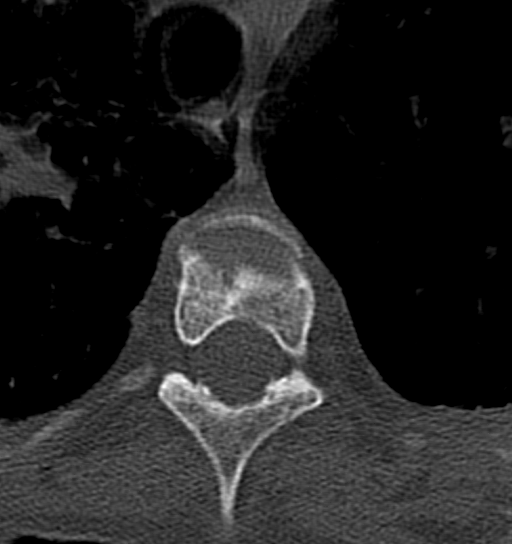
[im 30/89  bone]
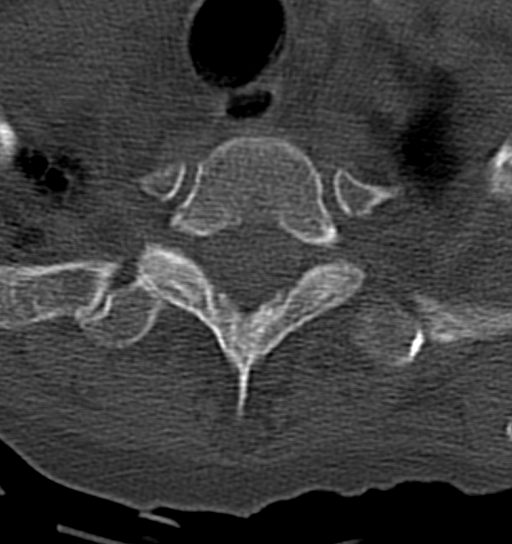
[im 45/89  bone]
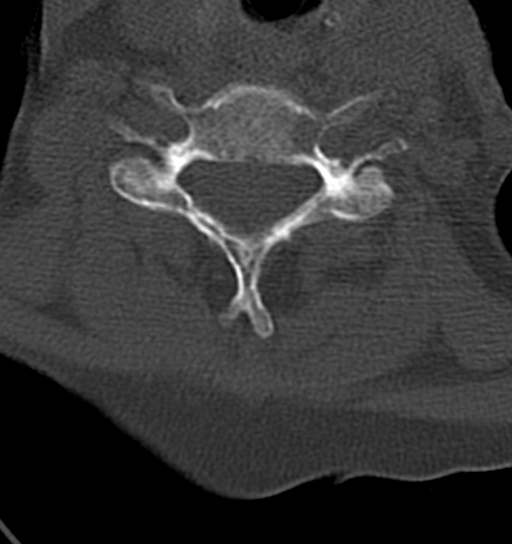
[im 59/89  bone]
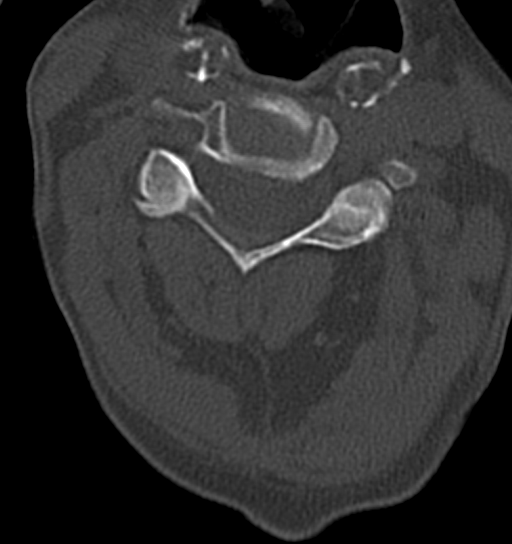
[im 74/89  soft-tissue]
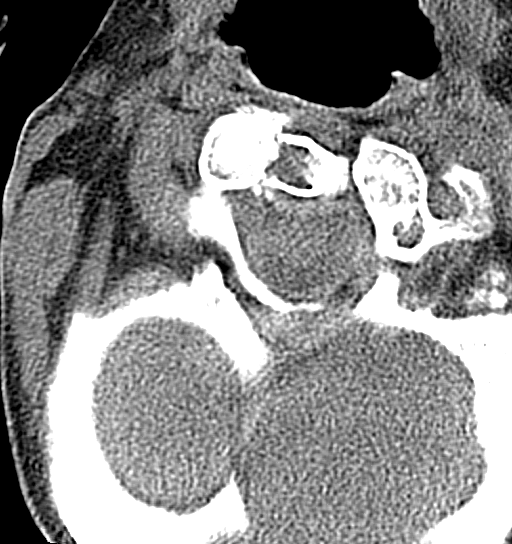
[im 74/89  bone]
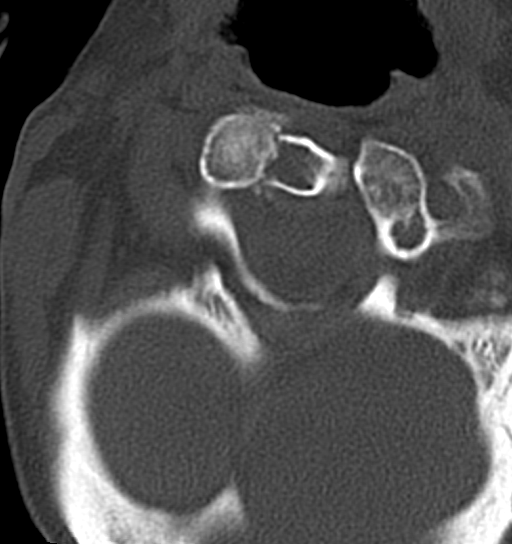

[Series 7: coronal bone · coronal · 0.20mm/px · 3 of 49 slices shown]
[im 11/49  bone]
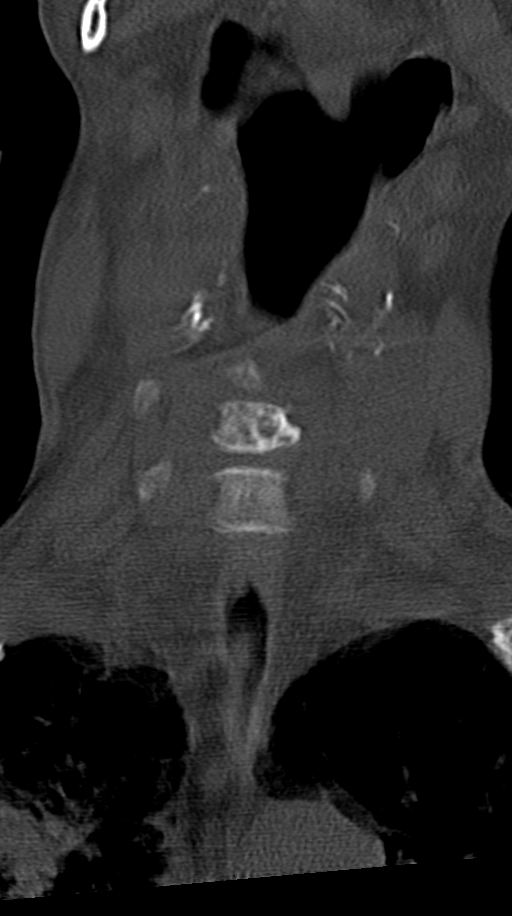
[im 20/49  bone]
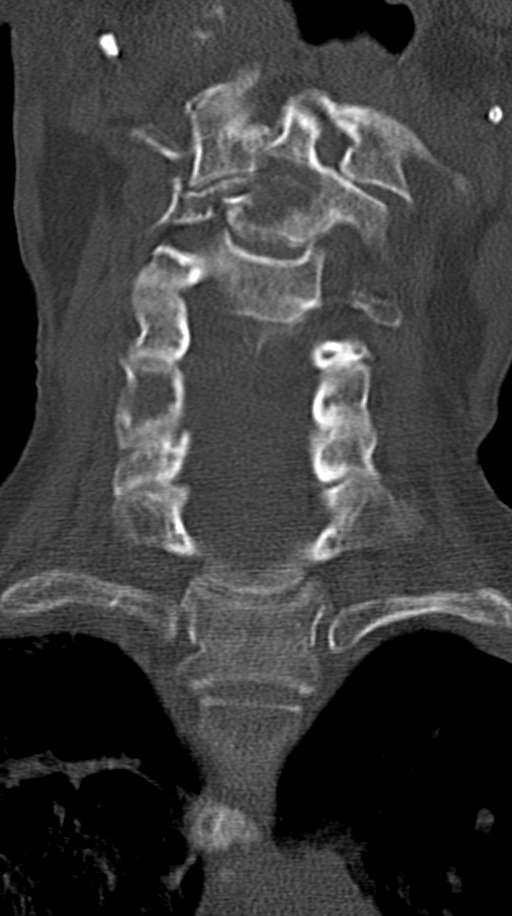
[im 29/49  bone]
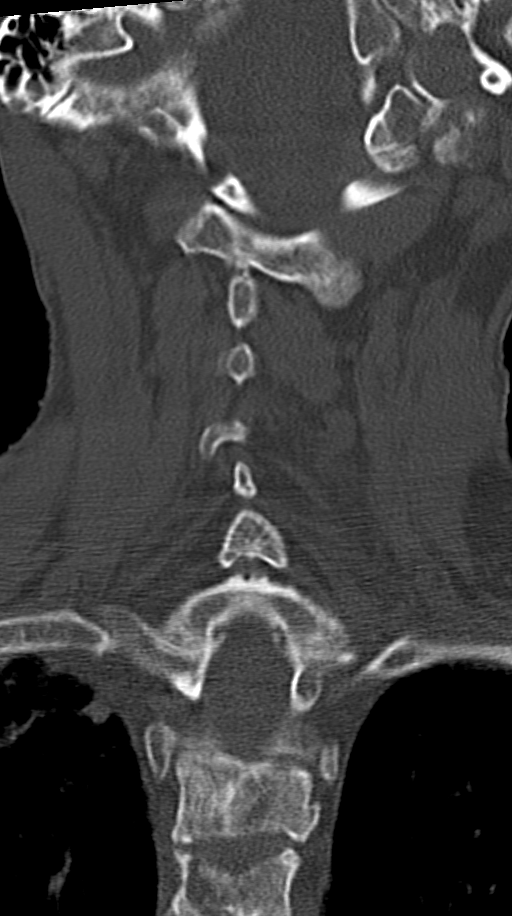

[9 of 33 positions shown; findings below may reference images not displayed]

FINDINGS: CT HEAD FINDINGS

Brain: Right occipital craniotomy has been performed with a small
amount of focal encephalomalacia subjacent to the craniotomy flap.
No evidence of acute intracranial hemorrhage or infarct. No abnormal
mass effect or midline shift. No abnormal intra or extra-axial mass
lesion or fluid collection. Ventricular size is normal. Cerebellum
is unremarkable.

Vascular: No asymmetric hyperdense vasculature at the skull base.

Skull: Lytic lesions are seen involving the a skull base adjacent to
the jugular foramen and intimately of all vein the left stylomastoid
foramen and right occipital condyle compatible with metastatic
osseous implants in this patient with known metastatic lung cancer.
Additional metastatic implants noted within the right clivus
subjacent to the foramen lacerum. No acute fracture.

Sinuses/Orbits: The paranasal sinuses are clear. The orbits are
unremarkable.

Other: Mastoid air cells and middle ear cavities are clear.

CT CERVICAL SPINE FINDINGS

Alignment: Normal cervical lordosis.  No listhesis.

Skull base and vertebrae: Multiple osseous lytic lesions are
identified:

There is a lytic lesion involving the right occipital condyle
without associated pathologic fracture.

Lytic lesion is seen within the body of C2 extending into the right
lateral body of C2 and right pedicle of C2. There is associated
pathologic fracture of the right body of C2 with compression and
impingement of the a transverse foramen.

Lytic lesion within the body of C3 without associated compression.

Lytic lesion within the body of C4 with associated right paracentral
compression fracture without associated retropulsion. The posterior
elements appear intact.

Lytic lesion within the body of C5 with left paracentral compression
fracture with approximately 80% loss of height. The posterior
elements are not involved.

Mixed lytic and sclerotic metastasis within the body of C7 without
associated pathologic fracture.

Sclerotic metastases are again seen within the T3 and T4 vertebral
bodies with associated compression fractures which appear
progressive since prior MRI examination. Minimal retropulsion at T4,
similar to prior examination. The spinal canal appears widely
patent.

Soft tissues and spinal canal: No prevertebral fluid or swelling. No
visible canal hematoma.

Disc levels: Intervertebral disc spaces are preserved. The
paraspinal soft tissues are unremarkable.

Upper chest: Multiple osseous metastases are seen within the a
visualized thoracic cage and thoracic spine. Masslike consolidation
noted within the right apex.

Other: Left internal jugular chest port partially visualized.
IMPRESSION: No acute intracranial abnormality.  No calvarial fracture.

Widespread osseous metastatic disease within the skull base and
cervical spine. Pathologic fractures of the a right lateral body of
C2, C4, and C5 which appear new since prior MRI examination.
Fracture of C2 impinges the transverse foramen though the adjacent
vertebral artery is not well assessed on this noncontrast
examination. Lack of surrounding soft tissue swelling at this level
suggests that this may be subacute to remote in etiology. No
associated listhesis.

Attempts are being made at this time to contact the managing
clinician for direct communication of the critical findings.

ADDENDUM:
These results were called by telephone at the time of interpretation
on [DATE] at [DATE] to provider KOOTJE , who verbally
acknowledged these results.

*** End of Addendum ***
FINDINGS: CT HEAD FINDINGS

Brain: Right occipital craniotomy has been performed with a small
amount of focal encephalomalacia subjacent to the craniotomy flap.
No evidence of acute intracranial hemorrhage or infarct. No abnormal
mass effect or midline shift. No abnormal intra or extra-axial mass
lesion or fluid collection. Ventricular size is normal. Cerebellum
is unremarkable.

Vascular: No asymmetric hyperdense vasculature at the skull base.

Skull: Lytic lesions are seen involving the a skull base adjacent to
the jugular foramen and intimately of all vein the left stylomastoid
foramen and right occipital condyle compatible with metastatic
osseous implants in this patient with known metastatic lung cancer.
Additional metastatic implants noted within the right clivus
subjacent to the foramen lacerum. No acute fracture.

Sinuses/Orbits: The paranasal sinuses are clear. The orbits are
unremarkable.

Other: Mastoid air cells and middle ear cavities are clear.

CT CERVICAL SPINE FINDINGS

Alignment: Normal cervical lordosis.  No listhesis.

Skull base and vertebrae: Multiple osseous lytic lesions are
identified:

There is a lytic lesion involving the right occipital condyle
without associated pathologic fracture.

Lytic lesion is seen within the body of C2 extending into the right
lateral body of C2 and right pedicle of C2. There is associated
pathologic fracture of the right body of C2 with compression and
impingement of the a transverse foramen.

Lytic lesion within the body of C3 without associated compression.

Lytic lesion within the body of C4 with associated right paracentral
compression fracture without associated retropulsion. The posterior
elements appear intact.

Lytic lesion within the body of C5 with left paracentral compression
fracture with approximately 80% loss of height. The posterior
elements are not involved.

Mixed lytic and sclerotic metastasis within the body of C7 without
associated pathologic fracture.

Sclerotic metastases are again seen within the T3 and T4 vertebral
bodies with associated compression fractures which appear
progressive since prior MRI examination. Minimal retropulsion at T4,
similar to prior examination. The spinal canal appears widely
patent.

Soft tissues and spinal canal: No prevertebral fluid or swelling. No
visible canal hematoma.

Disc levels: Intervertebral disc spaces are preserved. The
paraspinal soft tissues are unremarkable.

Upper chest: Multiple osseous metastases are seen within the a
visualized thoracic cage and thoracic spine. Masslike consolidation
noted within the right apex.

Other: Left internal jugular chest port partially visualized.
IMPRESSION: No acute intracranial abnormality.  No calvarial fracture.

Widespread osseous metastatic disease within the skull base and
cervical spine. Pathologic fractures of the a right lateral body of
C2, C4, and C5 which appear new since prior MRI examination.
Fracture of C2 impinges the transverse foramen though the adjacent
vertebral artery is not well assessed on this noncontrast
examination. Lack of surrounding soft tissue swelling at this level
suggests that this may be subacute to remote in etiology. No
associated listhesis.

Attempts are being made at this time to contact the managing
clinician for direct communication of the critical findings.

## 2020-02-27 IMAGING — CR DG LUMBAR SPINE 2-3V
3 series · 3 of 3 positions shown · non-contrast
Comparison: None.

CLINICAL DATA: Initial evaluation for acute trauma, fall, low back
pain.

EXAM:
LUMBAR SPINE - 2-3 VIEW

[x lumbar spine lat (1 of 3)]
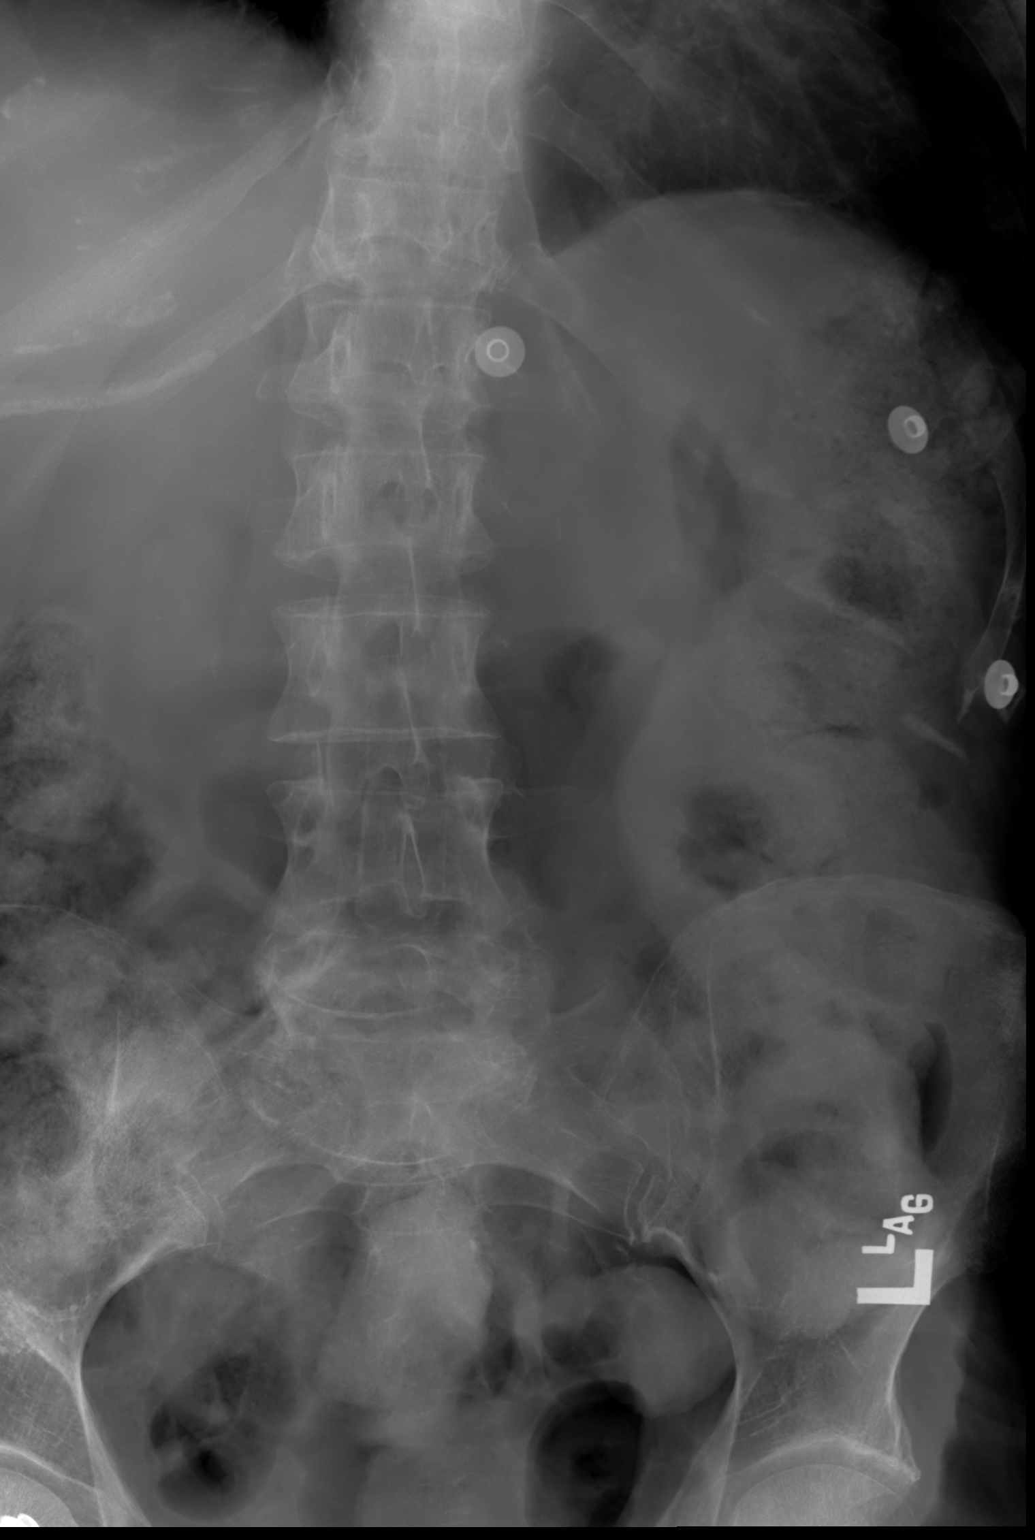

[x lumbar spine lat (2 of 3)]
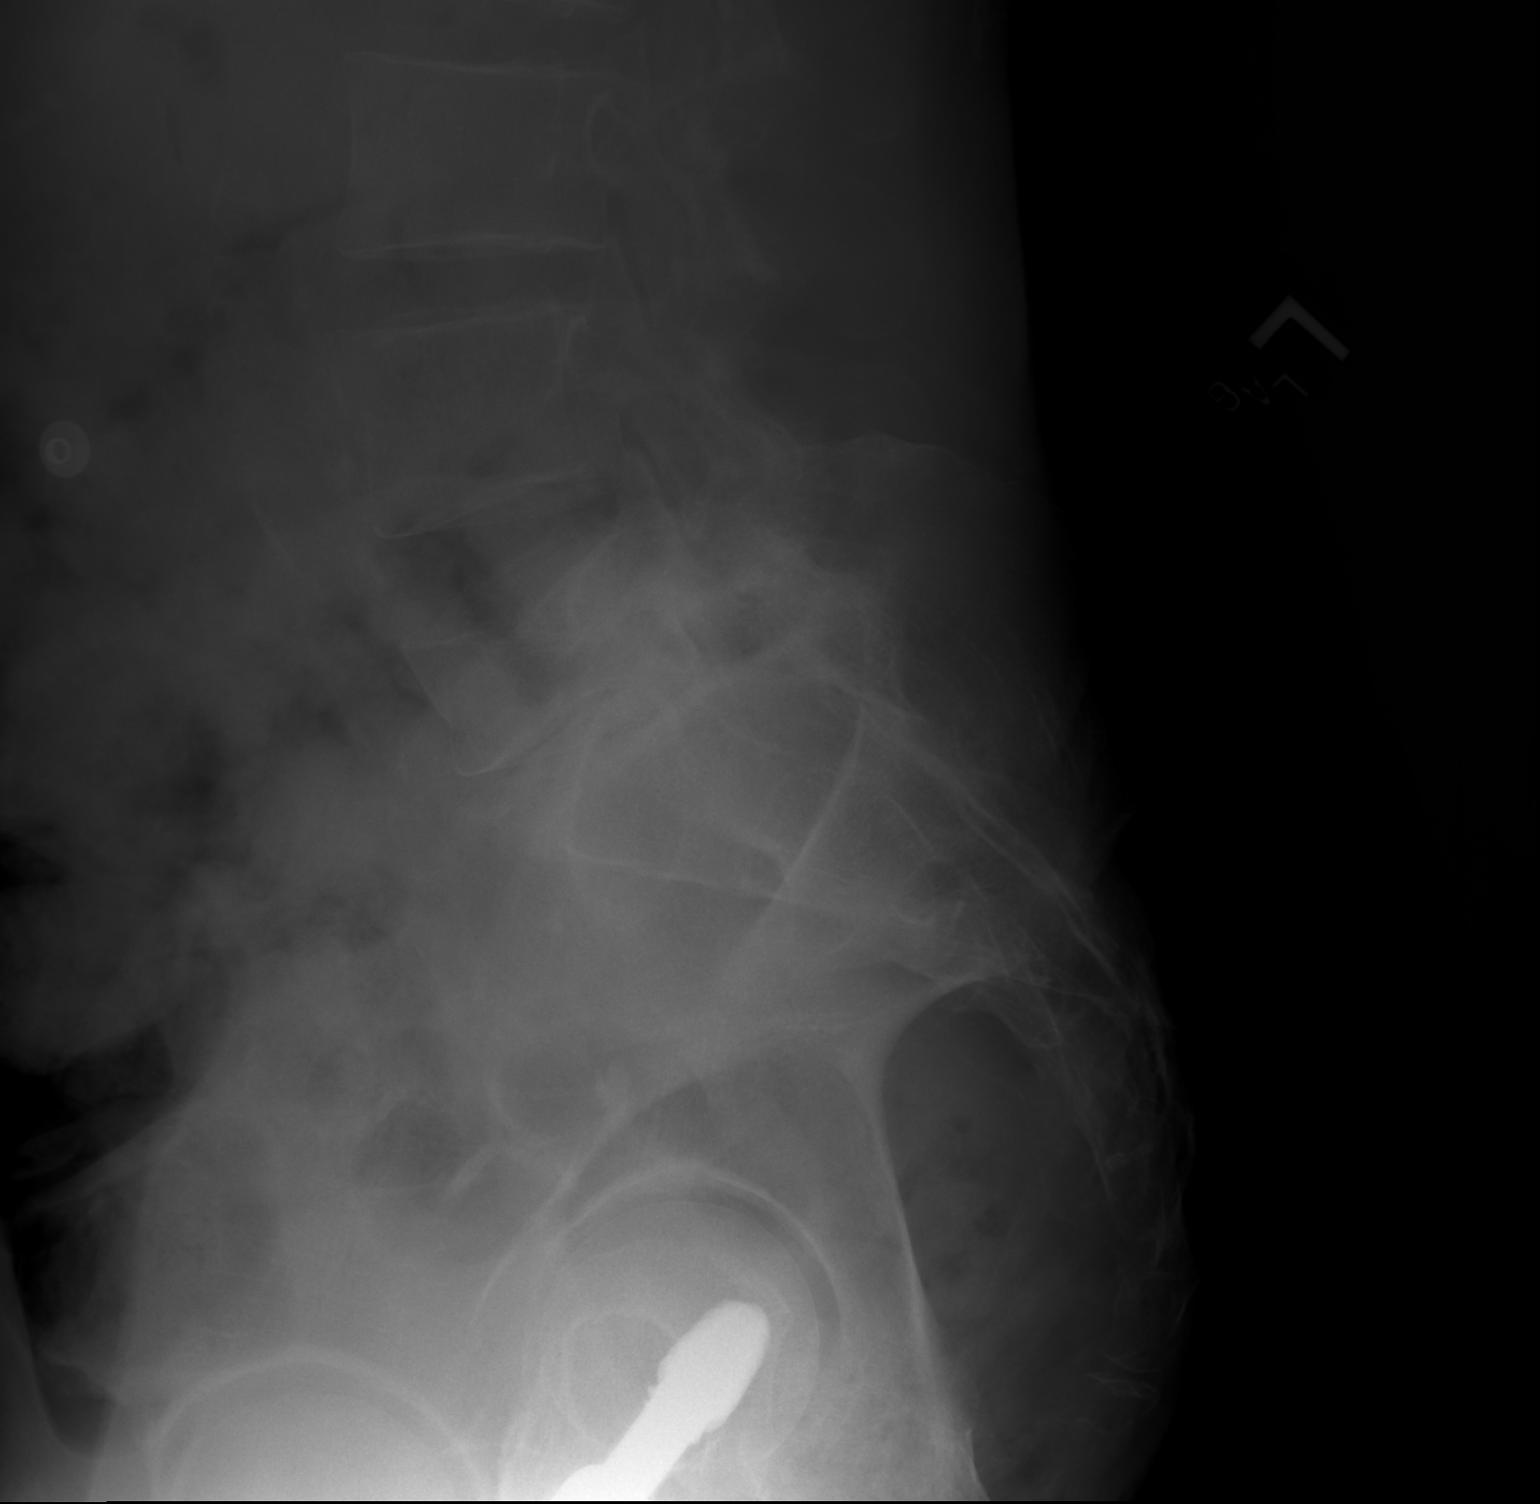

[x lumbar spine lat (3 of 3)]
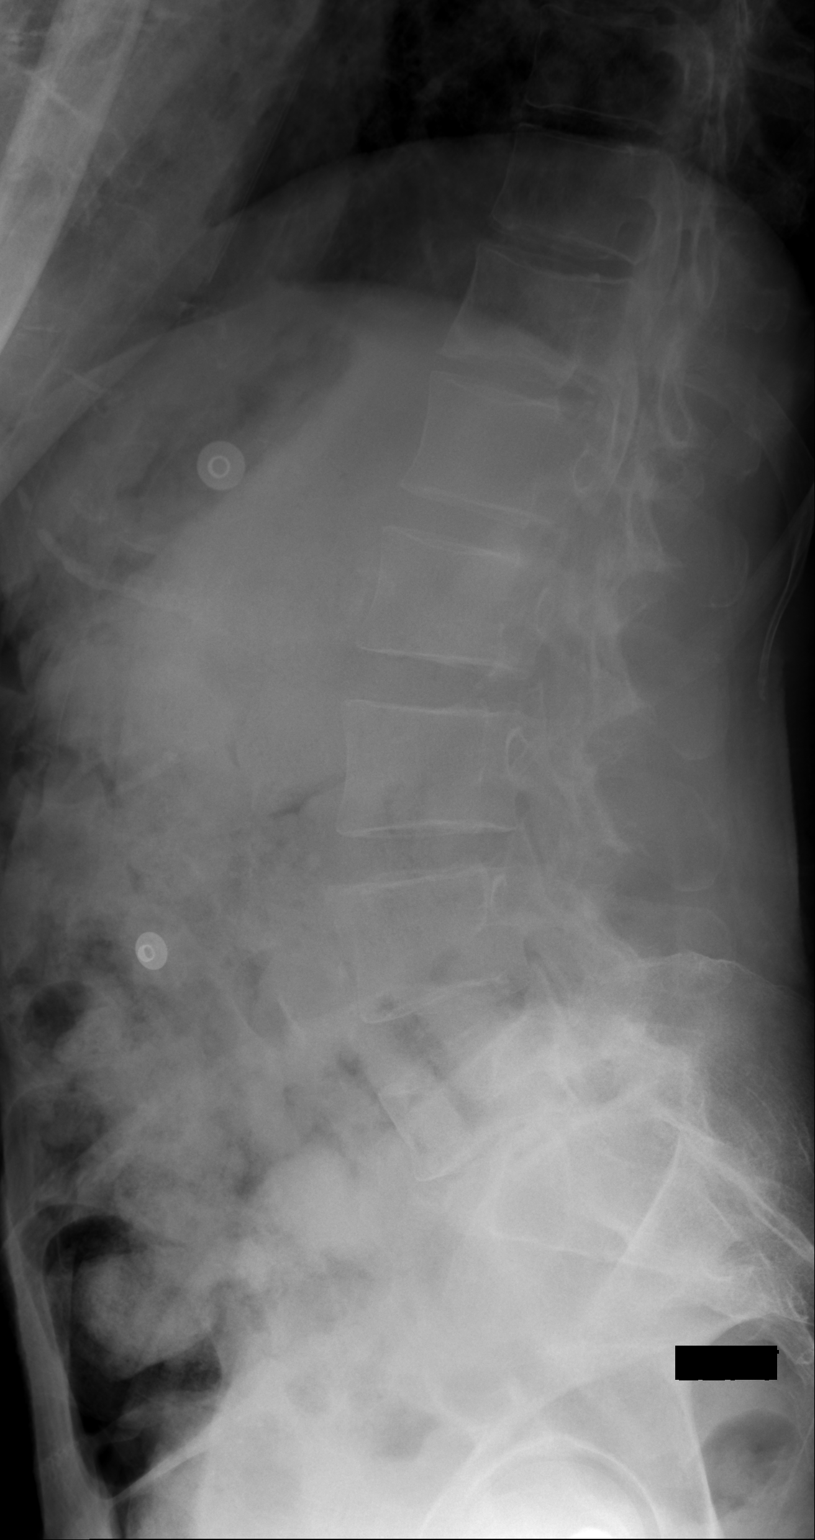

[3 of 3 positions shown; findings below may reference images not displayed]

FINDINGS: Five non rib-bearing lumbar type vertebral bodies. Vertebral bodies
normally aligned with preservation of the normal lumbar lordosis. No
significant listhesis.

Mild chronic appearing height loss noted at the superior endplate of
L5. Vertebral body height otherwise maintained without visible acute
fracture. Sacrum and pelvis intact. SI joints approximated.
Underlying osteopenia noted.

Moderate degenerative intervertebral disc space narrowing with facet
arthrosis present at L5-S1. Intervertebral disc space height
otherwise maintained.

IM fixation nail partially visualize within the right femoral head.
No visible soft tissue injury.
IMPRESSION: 1. No radiographic evidence for acute abnormality within the lumbar
spine.
2. Mild chronic height loss at the superior endplate of L5.
3. Moderate degenerative spondylolysis at L5-S1.

## 2020-02-27 MED ORDER — LORAZEPAM 2 MG/ML IJ SOLN: 1.0000 mg | INTRAMUSCULAR | Status: DC | PRN

## 2020-02-27 MED ORDER — FENTANYL CITRATE (PF) 100 MCG/2ML IJ SOLN
25.0000 ug | INTRAMUSCULAR | Status: DC | PRN
  Administered 2020-02-27: 25 ug via INTRAVENOUS
  Filled 2020-02-27: qty 2

## 2020-02-27 MED ORDER — DEXAMETHASONE 2 MG PO TABS: 2.0000 mg | ORAL_TABLET | Freq: Two times a day (BID) | ORAL | Status: DC

## 2020-02-27 MED ORDER — PANTOPRAZOLE SODIUM 40 MG PO TBEC: 40.0000 mg | DELAYED_RELEASE_TABLET | Freq: Every day | ORAL | Status: DC

## 2020-02-27 MED ORDER — SODIUM CHLORIDE 0.9 % IV SOLN
1000.0000 mL | INTRAVENOUS | Status: DC
  Administered 2020-02-27 (×2): 1000 mL via INTRAVENOUS

## 2020-02-27 MED ORDER — LEVETIRACETAM 500 MG PO TABS: 500.0000 mg | ORAL_TABLET | Freq: Two times a day (BID) | ORAL | Status: DC

## 2020-02-27 MED ORDER — SODIUM CHLORIDE 0.9 % IV SOLN: 10.0000 mL/h | Freq: Once | INTRAVENOUS | Status: DC

## 2020-02-27 MED ORDER — OXYCODONE HCL ER 20 MG PO T12A: 20.0000 mg | EXTENDED_RELEASE_TABLET | Freq: Four times a day (QID) | ORAL | Status: DC

## 2020-02-27 MED ORDER — FENTANYL 25 MCG/HR TD PT72
1.0000 | MEDICATED_PATCH | TRANSDERMAL | Status: DC
Start: 2020-02-27 — End: 2020-02-28
  Administered 2020-02-27: 1 via TRANSDERMAL
  Filled 2020-02-27: qty 1

## 2020-02-27 MED ORDER — OLANZAPINE 5 MG PO TABS: 5.0000 mg | ORAL_TABLET | Freq: Two times a day (BID) | ORAL | Status: DC

## 2020-02-27 MED ORDER — ACETAMINOPHEN 325 MG PO TABS: 650.0000 mg | ORAL_TABLET | Freq: Four times a day (QID) | ORAL | Status: DC | PRN

## 2020-02-27 MED ORDER — ORAL CARE MOUTH RINSE
15.0000 mL | Freq: Two times a day (BID) | OROMUCOSAL | Status: DC
  Administered 2020-02-27: 15 mL via OROMUCOSAL

## 2020-02-27 MED ORDER — SODIUM CHLORIDE 0.9 % IV BOLUS (SEPSIS)
1000.0000 mL | Freq: Once | INTRAVENOUS | Status: AC
  Administered 2020-02-27: 1000 mL via INTRAVENOUS

## 2020-02-27 MED ORDER — SODIUM CHLORIDE 0.9 % IV BOLUS
1000.0000 mL | Freq: Once | INTRAVENOUS | Status: AC
  Administered 2020-02-27: 1000 mL via INTRAVENOUS

## 2020-02-27 MED ORDER — CHLORHEXIDINE GLUCONATE 0.12 % MT SOLN: 15.0000 mL | Freq: Two times a day (BID) | OROMUCOSAL | Status: DC

## 2020-02-27 MED ORDER — OXYCODONE HCL ER 10 MG PO T12A
20.0000 mg | EXTENDED_RELEASE_TABLET | Freq: Four times a day (QID) | ORAL | Status: DC
Start: 2020-02-27 — End: 2020-02-27

## 2020-02-27 MED ORDER — OXYCODONE HCL ER 10 MG PO T12A: 80.0000 mg | EXTENDED_RELEASE_TABLET | Freq: Four times a day (QID) | ORAL | Status: DC

## 2020-02-27 MED ORDER — ACETAMINOPHEN 650 MG RE SUPP: 650.0000 mg | Freq: Four times a day (QID) | RECTAL | Status: DC | PRN

## 2020-02-27 MED ORDER — METOPROLOL SUCCINATE ER 50 MG PO TB24: 25.0000 mg | ORAL_TABLET | Freq: Every day | ORAL | Status: DC

## 2020-02-27 MED ORDER — FUROSEMIDE 10 MG/ML IJ SOLN
40.0000 mg | Freq: Once | INTRAMUSCULAR | Status: AC
  Administered 2020-02-27: 40 mg via INTRAVENOUS
  Filled 2020-02-27: qty 4

## 2020-02-27 MED ORDER — ALBUTEROL SULFATE HFA 108 (90 BASE) MCG/ACT IN AERS
1.0000 | INHALATION_SPRAY | Freq: Four times a day (QID) | RESPIRATORY_TRACT | Status: DC | PRN
  Filled 2020-02-27: qty 6.7

## 2020-02-27 MED ORDER — LORAZEPAM 0.5 MG PO TABS: 0.5000 mg | ORAL_TABLET | Freq: Three times a day (TID) | ORAL | Status: DC | PRN

## 2020-02-27 MED ORDER — FENTANYL CITRATE (PF) 100 MCG/2ML IJ SOLN
25.0000 ug | INTRAMUSCULAR | Status: DC | PRN
  Administered 2020-02-27 (×2): 25 ug via INTRAVENOUS
  Filled 2020-02-27 (×2): qty 2

## 2020-02-27 MED ORDER — MIRTAZAPINE 7.5 MG PO TABS: 15.0000 mg | ORAL_TABLET | Freq: Every day | ORAL | Status: DC

## 2020-02-28 LAB — BPAM RBC
Blood Product Expiration Date: 202202012359
ISSUE DATE / TIME: 202201040613
Unit Type and Rh: 5100

## 2020-02-28 LAB — BPAM PLATELET PHERESIS
Blood Product Expiration Date: 202201062359
ISSUE DATE / TIME: 202201041006
Unit Type and Rh: 5100

## 2020-02-28 LAB — TYPE AND SCREEN
ABO/RH(D): O POS
Antibody Screen: NEGATIVE
Unit division: 0

## 2020-02-28 LAB — PREPARE PLATELET PHERESIS: Unit division: 0

## 2020-03-26 NOTE — ED Triage Notes (Signed)
BIB [facility] from home. Fell while standing up from bed. Reports bilateral foot pain for several weeks that caused her to fall. Denies head injury or LOC. Eloquis was dc'd weeks ago. Stage 4 lung cancer. Stopped chemo last week and was placed on hospice. No advanced directive on file. Hx of a cervical fracture. On 8-10L [place] at home.

## 2020-03-26 NOTE — ED Provider Notes (Signed)
[facility] DEPT Provider Note  CSN: [id number] Arrival date & time: 03/04/2020 2356  Chief Complaint(s) No chief complaint on file.  HPI Ashley Mayo is a 55 y.o. female with extensive past medical history listed below including stage IV lung cancer with mets to the skull, spine with pathologic fractures in the cervical and thoracic spines, mets to the liver undergoing palliative chemo/radiation therapy who transition into hospice care for palliative pain control is here after an unwitnessed fall at home.  Son reports that it appears that the patient was trying to get up to go the bedside commode and fell.  She is complaining of lower back pain.  Reports that she has been having lower extremity edema and bilateral leg. For the past week, she has been sleeping more and less active, barely able to get out of bed on her own. She also has had less nutritional intake over that time as well.  Family reports that there has been no medication changes from palliative care.  They report that they have only arranged her medications.   On review of record: Previous work-up notable for pathologic fractures resulting in cord compression.  Patient was deemed not a good surgical candidate by primary team during oncology clinic visit on the 23rd.  At that time patient also noted to be anemic with a hemoglobin of 6.8 and platelet count of 15.  Due to this they are holding gemcitabine.   HPI  Past Medical History Past Medical History:  Diagnosis Date  . Anxiety   . Asthma   . Cancer ([place])    lung cancer  . COPD (chronic obstructive pulmonary disease) ([place])   . Depression   . GERD (gastroesophageal reflux disease)   . Hypertension   . Migraine    Patient Active Problem List   Diagnosis Date Noted  . Community acquired pneumonia   . Tachycardia   . Sinus tachycardia   . Closed wedge compression fracture of T8 vertebra ([place])   . Gastroesophageal reflux disease   .  Anemia   . Depression   . COPD with acute bronchitis ([place]) 11/11/2019  . Bronchitis 11/11/2019  . Hypoxemia 11/11/2019  . Pneumonia of both lungs due to Streptococcus pneumoniae ([place])   . Pressure injury of skin 07/06/2019  . Severe sepsis ([place])   . ADHD 01/27/2019  . History of pulmonary embolism 01/27/2019  . Focal seizure ([place]) 01/27/2019  . Hypomagnesemia 01/25/2019  . Symptomatic anemia   . Thrombocytopenia ([place])   . Leukopenia   . Perforated ear drum, bilateral 01/25/2016  . Multifocal pneumonia 01/23/2016  . Healthcare-associated pneumonia 01/23/2016  . Hypokalemia 01/23/2016  . Anxiety 01/23/2016  . Metastatic lung cancer (metastasis from lung to other site) ([place]) 01/23/2016  . Brain metastasis ([place]) 01/23/2016  . Essential hypertension 01/23/2016   Home Medication(s) Prior to Admission medications   Medication Sig Start Date End Date Taking? Authorizing Provider  albuterol (PROVENTIL HFA;VENTOLIN HFA) 108 (90 Base) MCG/ACT inhaler Inhale 1-2 puffs into the lungs every 6 (six) hours as needed for wheezing or shortness of breath.    [provider]  amphetamine-dextroamphetamine (ADDERALL XR) 30 MG 24 hr capsule Take 1 capsule by mouth daily. 06/15/18   [provider]  budesonide (PULMICORT) 0.5 MG/2ML nebulizer solution Take 2 mLs (0.5 mg total) by nebulization 2 (two) times daily for 5 days. 11/14/19 11/19/19  Eugenie Filler, MD  carisoprodol (SOMA) 350 MG tablet Take 350 mg by mouth 4 (four) times daily  as needed. 10/20/19   [provider]  dexamethasone (DECADRON) 2 MG tablet Take 1 tablet by mouth daily. 10/20/19   [provider]  docusate sodium (COLACE) 100 MG capsule Take 1 capsule (100 mg total) by mouth 2 (two) times daily. 07/11/19   Guilford Shi, MD  ELIQUIS 5 MG TABS tablet Take 5 mg by mouth 2 (two) times daily. 10/26/19   [provider]  esomeprazole (NEXIUM) 40 MG capsule Take 40 mg by mouth daily. 12/02/17    [provider]  feeding supplement, ENSURE ENLIVE, (ENSURE ENLIVE) LIQD Take 237 mLs by mouth 2 (two) times daily between meals. 07/12/19   Guilford Shi, MD  fentaNYL (DURAGESIC) 100 MCG/HR Place 1 patch onto the skin every 3 (three) days. 07/14/19   Guilford Shi, MD  fluticasone (FLONASE) 50 MCG/ACT nasal spray Place 2 sprays into both nostrils daily. 11/15/19   Eugenie Filler, MD  furosemide (LASIX) 20 MG tablet Take 20 mg by mouth daily as needed for fluid.     [provider]  ipratropium (ATROVENT) 0.02 % nebulizer solution Take 2.5 mLs (0.5 mg total) by nebulization 3 (three) times daily. Use 3 times daily x5 days, then every 6 hours as needed. 11/14/19   Eugenie Filler, MD  levalbuterol Penne Lash) 0.63 MG/3ML nebulizer solution Take 3 mLs (0.63 mg total) by nebulization 3 (three) times daily. Use 3 times daily x5 days, then every 6 hours as needed. 11/14/19   Eugenie Filler, MD  levETIRAcetam (KEPPRA) 500 MG tablet Take 1 tablet (500 mg total) by mouth 2 (two) times daily. 01/28/19 11/12/19  Dahal, Marlowe Aschoff, MD  lidocaine (LIDODERM) 5 % Place 1-2 patches onto the skin daily as needed (for back pain).     [provider]  lidocaine (XYLOCAINE) 2 % solution Use as directed 15 mLs in the mouth or throat every 6 (six) hours as needed for mouth pain.  11/24/18   [provider]  loratadine (CLARITIN) 10 MG tablet Take 1 tablet (10 mg total) by mouth daily. 11/15/19   Eugenie Filler, MD  Magnesium 200 MG TABS Take 1 tablet (200 mg total) by mouth daily. 01/26/19   Terrilee Croak, MD  metoprolol succinate (TOPROL-XL) 25 MG 24 hr tablet Take 1 tablet (25 mg total) by mouth daily. 11/15/19   Eugenie Filler, MD  Multiple Vitamin (MULTIVITAMIN WITH MINERALS) TABS tablet Take 1 tablet by mouth daily. 07/12/19   Guilford Shi, MD  nystatin (MYCOSTATIN) 100000 UNIT/ML suspension Take 5 mLs (500,000 Units total) by mouth 4 (four) times daily. 12/30/17    Tacy Learn, PA-C  OLANZapine (ZYPREXA) 5 MG tablet Take 5 mg by mouth 2 (two) times daily.    [provider]  ondansetron (ZOFRAN) 8 MG tablet Take 8 mg by mouth every 8 (eight) hours as needed for nausea or vomiting.  11/01/18   [provider]  polyethylene glycol (MIRALAX / GLYCOLAX) 17 g packet Take 17 g by mouth daily. 07/12/19   Guilford Shi, MD  SUMAtriptan (IMITREX) 6 MG/0.5ML SOLN injection Inject 6 mg into the skin every 2 (two) hours as needed for migraine.  11/30/18   [provider]  valACYclovir (VALTREX) 500 MG tablet Take 500 mg by mouth daily. 09/28/19   [provider]  Past Surgical History Past Surgical History:  Procedure Laterality Date  . ABDOMINAL HYSTERECTOMY    . CESAREAN SECTION    . ORIF right hip fracture     Family History Family History  Problem Relation Age of Onset  . Breast cancer Mother     Social History Social History   Tobacco Use  . Smoking status: Former Smoker    Packs/day: 0.25    Years: 35.00    Pack years: 8.75    Types: Cigarettes  . Smokeless tobacco: Never Used  Substance Use Topics  . Alcohol use: No  . Drug use: No   Allergies Codeine and Shellfish allergy  Review of Systems Review of Systems All other systems are reviewed and are negative for acute change except as noted in the HPI  Physical Exam Vital Signs  I have reviewed the triage vital signs BP 91/62   Pulse 84   Temp 98.6 F (37 C) (Oral)   Resp 20   SpO2 100%   Physical Exam Vitals reviewed.  Constitutional:      General: She is not in acute distress.    Appearance: She is well-developed and well-nourished. She is cachectic. She is ill-appearing. She is not diaphoretic.     Interventions: Cervical collar and face mask in place.  HENT:     Head: Normocephalic and atraumatic.     Nose:  Nose normal.  Eyes:     General: No scleral icterus.       Right eye: No discharge.        Left eye: No discharge.     Extraocular Movements: EOM normal.     Conjunctiva/sclera: Conjunctivae normal.     Pupils: Pupils are equal, round, and reactive to light.  Cardiovascular:     Rate and Rhythm: Normal rate and regular rhythm.     Heart sounds: No murmur heard. No friction rub. No gallop.   Pulmonary:     Effort: Pulmonary effort is normal. No respiratory distress.     Breath sounds: Normal breath sounds. No stridor. No rales.  Abdominal:     General: There is no distension.     Palpations: Abdomen is soft.     Tenderness: There is no abdominal tenderness.  Musculoskeletal:        General: No edema.     Cervical back: Normal range of motion and neck supple.     Lumbar back: Bony tenderness present. No tenderness.  Skin:    General: Skin is warm and dry.     Findings: No erythema or rash.  Neurological:     Mental Status: She is oriented to person, place, and time. She is lethargic.  Psychiatric:        Mood and Affect: Mood and affect normal.     ED Results and Treatments Labs (all labs ordered are listed, but only abnormal results are displayed) Labs Reviewed  CBC WITH DIFFERENTIAL/PLATELET - Abnormal; Notable for the following components:      Result Value   WBC 13.9 (*)    RBC 2.03 (*)    Hemoglobin 6.8 (*)    HCT 23.2 (*)    MCV 114.3 (*)    MCHC 29.3 (*)    RDW 32.2 (*)    Platelets 7 (*)    nRBC 13.2 (*)    Neutro Abs 12.8 (*)    Lymphs Abs 0.2 (*)    Abs Immature Granulocytes 0.23 (*)    All other components within normal limits  COMPREHENSIVE METABOLIC PANEL - Abnormal; Notable for the following components:   Sodium 130 (*)    Chloride 88 (*)    BUN 70 (*)    Creatinine, Ser 1.46 (*)    Calcium 7.8 (*)    Total Protein 5.4 (*)    Albumin 2.4 (*)    AST 254 (*)    ALT 153 (*)    Alkaline Phosphatase 1,040 (*)    Total Bilirubin 4.0 (*)    GFR,  Estimated 42 (*)    Anion gap 17 (*)    All other components within normal limits  RESP PANEL BY RT-PCR (FLU A&B, COVID) ARPGX2  URINALYSIS, ROUTINE W REFLEX MICROSCOPIC  POC OCCULT BLOOD, ED  PREPARE RBC (CROSSMATCH)  PREPARE PLATELET PHERESIS                                                                                                                         EKG  EKG Interpretation  Date/Time:    Ventricular Rate:    PR Interval:    QRS Duration:   QT Interval:    QTC Calculation:   R Axis:     Text Interpretation:        Radiology DG Lumbar Spine 2-3 Views  Result Date: 2020/03/03 CLINICAL DATA:  Initial evaluation for acute trauma, fall, low back pain. EXAM: LUMBAR SPINE - 2-3 VIEW COMPARISON:  None. FINDINGS: Five non rib-bearing lumbar type vertebral bodies. Vertebral bodies normally aligned with preservation of the normal lumbar lordosis. No significant listhesis. Mild chronic appearing height loss noted at the superior endplate of L5. Vertebral body height otherwise maintained without visible acute fracture. Sacrum and pelvis intact. SI joints approximated. Underlying osteopenia noted. Moderate degenerative intervertebral disc space narrowing with facet arthrosis present at L5-S1. Intervertebral disc space height otherwise maintained. IM fixation nail partially visualize within the right femoral head. No visible soft tissue injury. IMPRESSION: 1. No radiographic evidence for acute abnormality within the lumbar spine. 2. Mild chronic height loss at the superior endplate of L5. 3. 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 Electronically Signed   By: Jeannine Boga M.D.   On: 2020/03/03 01:16   CT Head Wo Contrast  Addendum Date: March 03, 2020   ADDENDUM REPORT: 2020-03-03 02:13 ADDENDUM: These results were called by telephone at the time of interpretation on 03/03/2020 at 2:10 am to provider [facility] , who verbally acknowledged these results. Electronically Signed   By:  Fidela Salisbury MD   On: 03/03/2020 02:13   Result Date: 2020-03-03 CLINICAL DATA:  Metastatic lung cancer, fall, cervical spine fracture EXAM: CT HEAD WITHOUT CONTRAST CT CERVICAL SPINE WITHOUT CONTRAST TECHNIQUE: Multidetector CT imaging of the head and cervical spine was performed following the standard protocol without intravenous contrast. Multiplanar CT image reconstructions of the cervical spine were also generated. COMPARISON:  None. Findings are correlated with prior MRI of the thoracic spine of 11/14/2019 FINDINGS: CT HEAD FINDINGS Brain: Right occipital craniotomy has been performed with a small  amount of focal encephalomalacia subjacent to the craniotomy flap. No evidence of acute intracranial hemorrhage or infarct. No abnormal mass effect or midline shift. No abnormal intra or extra-axial mass lesion or fluid collection. Ventricular size is normal. Cerebellum is unremarkable. Vascular: No asymmetric hyperdense vasculature at the skull base. Skull: Lytic lesions are seen involving the a skull base adjacent to the jugular foramen and intimately of all vein the left stylomastoid foramen and right occipital condyle compatible with metastatic osseous implants in this patient with known metastatic lung cancer. Additional metastatic implants noted within the right clivus subjacent to the foramen lacerum. No acute fracture. Sinuses/Orbits: The paranasal sinuses are clear. The orbits are unremarkable. Other: Mastoid air cells and middle ear cavities are clear. CT CERVICAL SPINE FINDINGS Alignment: Normal cervical lordosis.  No listhesis. Skull base and vertebrae: Multiple osseous lytic lesions are identified: There is a lytic lesion involving the right occipital condyle without associated pathologic fracture. Lytic lesion is seen within the body of C2 extending into the right lateral body of C2 and right pedicle of C2. There is associated pathologic fracture of the right body of C2 with compression and  impingement of the a transverse foramen. Lytic lesion within the body of C3 without associated compression. Lytic lesion within the body of C4 with associated right paracentral compression fracture without associated retropulsion. The posterior elements appear intact. Lytic lesion within the body of C5 with left paracentral compression fracture with approximately 80% loss of height. The posterior elements are not involved. Mixed lytic and sclerotic metastasis within the body of C7 without associated pathologic fracture. Sclerotic metastases are again seen within the T3 and T4 vertebral bodies with associated compression fractures which appear progressive since prior MRI examination. Minimal retropulsion at T4, similar to prior examination. The spinal canal appears widely patent. Soft tissues and spinal canal: No prevertebral fluid or swelling. No visible canal hematoma. Disc levels: Intervertebral disc spaces are preserved. The paraspinal soft tissues are unremarkable. Upper chest: Multiple osseous metastases are seen within the a visualized thoracic cage and thoracic spine. Masslike consolidation noted within the right apex. Other: Left internal jugular chest port partially visualized. IMPRESSION: No acute intracranial abnormality.  No calvarial fracture. Widespread osseous metastatic disease within the skull base and cervical spine. Pathologic fractures of the a right lateral body of C2, C4, and C5 which appear new since prior MRI examination. Fracture of C2 impinges the transverse foramen though the adjacent vertebral artery is not well assessed on this noncontrast examination. Lack of surrounding soft tissue swelling at this level suggests that this may be subacute to remote in etiology. No associated listhesis. Attempts are being made at this time to contact the managing clinician for direct communication of the critical findings. Electronically Signed: By: Fidela Salisbury MD On: March 11, 2020 02:04   CT Cervical  Spine Wo Contrast  Addendum Date: 11-Mar-2020   ADDENDUM REPORT: 11-Mar-2020 02:13 ADDENDUM: These results were called by telephone at the time of interpretation on 03-11-20 at 2:10 am to provider [facility] , who verbally acknowledged these results. Electronically Signed   By: Fidela Salisbury MD   On: 11-Mar-2020 02:13   Result Date: 03/11/2020 CLINICAL DATA:  Metastatic lung cancer, fall, cervical spine fracture EXAM: CT HEAD WITHOUT CONTRAST CT CERVICAL SPINE WITHOUT CONTRAST TECHNIQUE: Multidetector CT imaging of the head and cervical spine was performed following the standard protocol without intravenous contrast. Multiplanar CT image reconstructions of the cervical spine were also generated. COMPARISON:  None. Findings are correlated  with prior MRI of the thoracic spine of 11/14/2019 FINDINGS: CT HEAD FINDINGS Brain: Right occipital craniotomy has been performed with a small amount of focal encephalomalacia subjacent to the craniotomy flap. No evidence of acute intracranial hemorrhage or infarct. No abnormal mass effect or midline shift. No abnormal intra or extra-axial mass lesion or fluid collection. Ventricular size is normal. Cerebellum is unremarkable. Vascular: No asymmetric hyperdense vasculature at the skull base. Skull: Lytic lesions are seen involving the a skull base adjacent to the jugular foramen and intimately of all vein the left stylomastoid foramen and right occipital condyle compatible with metastatic osseous implants in this patient with known metastatic lung cancer. Additional metastatic implants noted within the right clivus subjacent to the foramen lacerum. No acute fracture. Sinuses/Orbits: The paranasal sinuses are clear. The orbits are unremarkable. Other: Mastoid air cells and middle ear cavities are clear. CT CERVICAL SPINE FINDINGS Alignment: Normal cervical lordosis.  No listhesis. Skull base and vertebrae: Multiple osseous lytic lesions are identified: There is a lytic lesion  involving the right occipital condyle without associated pathologic fracture. Lytic lesion is seen within the body of C2 extending into the right lateral body of C2 and right pedicle of C2. There is associated pathologic fracture of the right body of C2 with compression and impingement of the a transverse foramen. Lytic lesion within the body of C3 without associated compression. Lytic lesion within the body of C4 with associated right paracentral compression fracture without associated retropulsion. The posterior elements appear intact. Lytic lesion within the body of C5 with left paracentral compression fracture with approximately 80% loss of height. The posterior elements are not involved. Mixed lytic and sclerotic metastasis within the body of C7 without associated pathologic fracture. Sclerotic metastases are again seen within the T3 and T4 vertebral bodies with associated compression fractures which appear progressive since prior MRI examination. Minimal retropulsion at T4, similar to prior examination. The spinal canal appears widely patent. Soft tissues and spinal canal: No prevertebral fluid or swelling. No visible canal hematoma. Disc levels: Intervertebral disc spaces are preserved. The paraspinal soft tissues are unremarkable. Upper chest: Multiple osseous metastases are seen within the a visualized thoracic cage and thoracic spine. Masslike consolidation noted within the right apex. Other: Left internal jugular chest port partially visualized. IMPRESSION: No acute intracranial abnormality.  No calvarial fracture. Widespread osseous metastatic disease within the skull base and cervical spine. Pathologic fractures of the a right lateral body of C2, C4, and C5 which appear new since prior MRI examination. Fracture of C2 impinges the transverse foramen though the adjacent vertebral artery is not well assessed on this noncontrast examination. Lack of surrounding soft tissue swelling at this level suggests  that this may be subacute to remote in etiology. No associated listhesis. Attempts are being made at this time to contact the managing clinician for direct communication of the critical findings. Electronically Signed: By: Fidela Salisbury MD On: Mar 07, 2020 02:04   DG Chest Port 1 View  Result Date: 2020/03/07 CLINICAL DATA:  Initial evaluation for acute cough, history of lung cancer. EXAM: PORTABLE CHEST 1 VIEW COMPARISON:  Prior CT from 11/11/2019 FINDINGS: Left-sided Port-A-Cath in place with tip overlying the distal SVC. Heart size within normal limits. Mediastinal silhouette normal. Lungs hypoinflated. Approximate 4.5 cm nodular density overlying the mid left lung consistent with known metastatic disease. Few additional pleural based and/or osseous metastases noted, also seen on prior CT. Irregular pleuroparenchymal scarring and/or architectural distortion present at the right perihilar region. Patchy  and hazy opacity at the right lung base could reflect atelectasis or infiltrate. No pulmonary edema or pleural effusion. No pneumothorax. Probable healing pathologic fracture noted at the right lower ribs. IMPRESSION: 1. Patchy and hazy opacity at the right lung base, which could reflect atelectasis or infiltrate. 2. 4.5 cm nodular density overlying the mid left lung, consistent with known metastatic disease. Few additional pleural based and/or osseous metastases noted as well, also seen on prior CT. 3. Probable healing pathologic fracture at the right lower ribs. Electronically Signed   By: Jeannine Boga M.D.   On: March 25, 2020 01:45    Pertinent labs & imaging results that were available during my care of the patient were reviewed by me and considered in my medical decision making (see chart for details).  Medications Ordered in ED Medications  sodium chloride 0.9 % bolus 1,000 mL (1,000 mLs Intravenous New Bag/Given 03-25-2020 0133)    Followed by  0.9 %  sodium chloride infusion (1,000 mLs  Intravenous New Bag/Given 03/25/20 0304)  fentaNYL (SUBLIMAZE) injection 25 mcg (has no administration in time range)  0.9 %  sodium chloride infusion (has no administration in time range)  0.9 %  sodium chloride infusion (has no administration in time range)  sodium chloride 0.9 % bolus 1,000 mL (1,000 mLs Intravenous New Bag/Given 03-25-20 0304)                                                                                                                                    Procedures .Critical Care Performed by: Fatima Blank, MD Authorized by: Fatima Blank, MD   Critical care provider statement:    Critical care time (minutes):  60   Critical care was necessary to treat or prevent imminent or life-threatening deterioration of the following conditions:  Circulatory failure   Critical care was time spent personally by me on the following activities:  Discussions with consultants, evaluation of patient's response to treatment, examination of patient, ordering and performing treatments and interventions, ordering and review of laboratory studies, ordering and review of radiographic studies, pulse oximetry, re-evaluation of patient's condition, obtaining history from patient or surrogate and review of old charts    (including critical care time)  Medical Decision Making / ED Course I have reviewed the nursing notes for this encounter and the patient's prior records (if available in EHR or on provided paperwork).   Yuriko Portales was evaluated in Emergency Department on 03-25-20 for the symptoms described in the history of present illness. She was evaluated in the context of the global COVID-19 pandemic, which necessitated consideration that the patient might be at risk for infection with the SARS-CoV-2 virus that causes COVID-19. Institutional protocols and algorithms that pertain to the evaluation of patients at risk for COVID-19 are in a state of rapid change based on  information released by regulatory bodies including the CDC and federal and state organizations. These policies and  algorithms were followed during the patient's care in the ED.   Failure to thrive in the setting of metastatic lung cancer complicated with pathologic fractures to the cervical and thoracic spine. Here after a fall at home. Patient is no longer on anticoagulation. Has tenderness to palpation in the lower back spine.  Given her known cervical fractures, will obtain CT head and cervical spine to assess stability. CT head and cervical spine appear to be unchanged from recent CTs performed at [facility] in early December.  Reads of those imaging were discussed with radiology and they agreed that it appears to be similar.  Plain film of the lumbar spine did not reveal evidence of acute fracture. Work-up did reveal anemia and thrombocytopenia. Patient's blood pressure now with systolics in the 44C. Provided with IV fluids. PRBC and platelet transfusions ordered. Patient admitted to medicine for further management.  Patient is still FULL CODE, confirmed with patient tonight.       Final Clinical Impression(s) / ED Diagnoses Final diagnoses:  Cough  Adult failure to thrive syndrome  Anemia due to antineoplastic chemotherapy  Other secondary thrombocytopenia      This chart was dictated using voice recognition software.  Despite best efforts to proofread,  errors can occur which can change the documentation meaning.   Fatima Blank, MD 02-Mar-2020 [phone]

## 2020-03-26 NOTE — ED Notes (Signed)
Transported to XR  

## 2020-03-26 NOTE — Hospital Course (Addendum)
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 Despite treatments her cancer has persisted and her functional status has progressively declined especially over the past 1 month.  Her 2 sons report that her appetite has significantly decreased over the past 1 week prior to hospitalization and her mentation has started to become less lucid. Due to her severe lethargy and failure to thrive, she was brought to the hospital for further evaluation.  She had fallen out of the bed at home upon attempting to get up. She was placed on a nonrebreather in the ER and admitted for further monitoring.  Goals of care were discussed on admission and her sons wished to pursue comfort measures however trial of blood and platelet transfusions were attempted.  She became congested, more short of breath, and uncomfortable after transfusions.  This was discussed further bedside with her sons and transition to full comfort measures were then recommended and pursued.  She continued to decline and passed naturally on 03-25-20.

## 2020-03-26 NOTE — Assessment & Plan Note (Signed)
-   s/p 1 unit PRBC on March 23, 2020; patient became more short of breath and uncomfortable after transfusion -Lasix 40 mg IV x1 now

## 2020-03-26 NOTE — Discharge Summary (Signed)
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 Despite treatments her cancer has persisted and her functional status has progressively declined especially over the past 1 month.  Her 2 sons report that her appetite has significantly decreased over the past 1 week prior to hospitalization and her mentation has started to become less lucid. Due to her severe lethargy and failure to thrive, she was brought to the hospital for further evaluation.  She had fallen out of the bed at home upon attempting to get up. She was placed on a nonrebreather in the ER and admitted for further monitoring.  Goals of care were discussed on admission and her sons wished to pursue comfort measures however trial of blood and platelet transfusions were attempted.  She became congested, more short of breath, and uncomfortable after transfusions.  This was discussed further bedside with her sons and transition to full comfort measures were then recommended and pursued.  She continued to decline and passed naturally on March 10, 2020.     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    Significant Diagnostic Studies: DG Lumbar Spine 2-3 Views  Result Date: 03/10/20 CLINICAL DATA:  Initial evaluation for acute trauma, fall, low back pain. EXAM: LUMBAR SPINE - 2-3 VIEW COMPARISON:  None. FINDINGS: Five non rib-bearing lumbar type vertebral bodies. Vertebral bodies normally  aligned with preservation of the normal lumbar lordosis. No significant listhesis. Mild chronic appearing height loss noted at the superior endplate of L5. Vertebral body height otherwise maintained without visible acute fracture. Sacrum and pelvis intact. SI joints approximated. Underlying osteopenia noted. Moderate degenerative intervertebral disc space narrowing with facet arthrosis present at L5-S1. Intervertebral disc space height otherwise maintained. IM fixation nail partially visualize within the right femoral head. No visible soft tissue injury. IMPRESSION: 1. No radiographic evidence for acute abnormality within the lumbar spine. 2. Mild chronic height loss at the superior endplate of L5. 3. 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 Electronically Signed   By: Jeannine Boga M.D.   On: 03/10/20 01:16   CT Head Wo Contrast  Addendum Date: 10-Mar-2020   ADDENDUM REPORT: 2020/03/10 02:13 ADDENDUM: These results were called by telephone at the time of interpretation on 2020-03-10 at 2:10 am to provider [facility] , who verbally acknowledged these results. Electronically Signed   By: Fidela Salisbury MD   On: 03/10/20 02:13   Result Date: Mar 10, 2020 CLINICAL DATA:  Metastatic lung cancer, fall, cervical spine fracture EXAM: CT HEAD WITHOUT CONTRAST CT CERVICAL SPINE WITHOUT CONTRAST TECHNIQUE: Multidetector CT imaging of the head and cervical spine was performed following the standard protocol without intravenous contrast. Multiplanar CT image reconstructions of the cervical spine were also generated. COMPARISON:  None. Findings are correlated with prior MRI of the thoracic spine of 11/14/2019 FINDINGS: CT HEAD FINDINGS Brain: Right occipital craniotomy has been performed with a small amount of focal encephalomalacia subjacent to the craniotomy flap. No evidence of acute intracranial hemorrhage or infarct. No abnormal mass effect or midline shift. No  abnormal intra or extra-axial mass lesion or  fluid collection. Ventricular size is normal. Cerebellum is unremarkable. Vascular: No asymmetric hyperdense vasculature at the skull base. Skull: Lytic lesions are seen involving the a skull base adjacent to the jugular foramen and intimately of all vein the left stylomastoid foramen and right occipital condyle compatible with metastatic osseous implants in this patient with known metastatic lung cancer. Additional metastatic implants noted within the right clivus subjacent to the foramen lacerum. No acute fracture. Sinuses/Orbits: The paranasal sinuses are clear. The orbits are unremarkable. Other: Mastoid air cells and middle ear cavities are clear. CT CERVICAL SPINE FINDINGS Alignment: Normal cervical lordosis.  No listhesis. Skull base and vertebrae: Multiple osseous lytic lesions are identified: There is a lytic lesion involving the right occipital condyle without associated pathologic fracture. Lytic lesion is seen within the body of C2 extending into the right lateral body of C2 and right pedicle of C2. There is associated pathologic fracture of the right body of C2 with compression and impingement of the a transverse foramen. Lytic lesion within the body of C3 without associated compression. Lytic lesion within the body of C4 with associated right paracentral compression fracture without associated retropulsion. The posterior elements appear intact. Lytic lesion within the body of C5 with left paracentral compression fracture with approximately 80% loss of height. The posterior elements are not involved. Mixed lytic and sclerotic metastasis within the body of C7 without associated pathologic fracture. Sclerotic metastases are again seen within the T3 and T4 vertebral bodies with associated compression fractures which appear progressive since prior MRI examination. Minimal retropulsion at T4, similar to prior examination. The spinal canal appears widely patent. Soft tissues and spinal canal: No prevertebral  fluid or swelling. No visible canal hematoma. Disc levels: Intervertebral disc spaces are preserved. The paraspinal soft tissues are unremarkable. Upper chest: Multiple osseous metastases are seen within the a visualized thoracic cage and thoracic spine. Masslike consolidation noted within the right apex. Other: Left internal jugular chest port partially visualized. IMPRESSION: No acute intracranial abnormality.  No calvarial fracture. Widespread osseous metastatic disease within the skull base and cervical spine. Pathologic fractures of the a right lateral body of C2, C4, and C5 which appear new since prior MRI examination. Fracture of C2 impinges the transverse foramen though the adjacent vertebral artery is not well assessed on this noncontrast examination. Lack of surrounding soft tissue swelling at this level suggests that this may be subacute to remote in etiology. No associated listhesis. Attempts are being made at this time to contact the managing clinician for direct communication of the critical findings. Electronically Signed: By: Fidela Salisbury MD On: 03-19-20 02:04   CT Cervical Spine Wo Contrast  Addendum Date: 2020-03-19   ADDENDUM REPORT: 2020/03/19 02:13 ADDENDUM: These results were called by telephone at the time of interpretation on 2020-03-19 at 2:10 am to provider [facility] , who verbally acknowledged these results. Electronically Signed   By: Fidela Salisbury MD   On: Mar 19, 2020 02:13   Result Date: March 19, 2020 CLINICAL DATA:  Metastatic lung cancer, fall, cervical spine fracture EXAM: CT HEAD WITHOUT CONTRAST CT CERVICAL SPINE WITHOUT CONTRAST TECHNIQUE: Multidetector CT imaging of the head and cervical spine was performed following the standard protocol without intravenous contrast. Multiplanar CT image reconstructions of the cervical spine were also generated. COMPARISON:  None. Findings are correlated with prior MRI of the thoracic spine of 11/14/2019 FINDINGS: CT HEAD FINDINGS Brain:  Right occipital craniotomy has been performed with a small amount  of focal encephalomalacia subjacent to the craniotomy flap. No evidence of acute intracranial hemorrhage or infarct. No abnormal mass effect or midline shift. No abnormal intra or extra-axial mass lesion or fluid collection. Ventricular size is normal. Cerebellum is unremarkable. Vascular: No asymmetric hyperdense vasculature at the skull base. Skull: Lytic lesions are seen involving the a skull base adjacent to the jugular foramen and intimately of all vein the left stylomastoid foramen and right occipital condyle compatible with metastatic osseous implants in this patient with known metastatic lung cancer. Additional metastatic implants noted within the right clivus subjacent to the foramen lacerum. No acute fracture. Sinuses/Orbits: The paranasal sinuses are clear. The orbits are unremarkable. Other: Mastoid air cells and middle ear cavities are clear. CT CERVICAL SPINE FINDINGS Alignment: Normal cervical lordosis.  No listhesis. Skull base and vertebrae: Multiple osseous lytic lesions are identified: There is a lytic lesion involving the right occipital condyle without associated pathologic fracture. Lytic lesion is seen within the body of C2 extending into the right lateral body of C2 and right pedicle of C2. There is associated pathologic fracture of the right body of C2 with compression and impingement of the a transverse foramen. Lytic lesion within the body of C3 without associated compression. Lytic lesion within the body of C4 with associated right paracentral compression fracture without associated retropulsion. The posterior elements appear intact. Lytic lesion within the body of C5 with left paracentral compression fracture with approximately 80% loss of height. The posterior elements are not involved. Mixed lytic and sclerotic metastasis within the body of C7 without associated pathologic fracture. Sclerotic metastases are again seen  within the T3 and T4 vertebral bodies with associated compression fractures which appear progressive since prior MRI examination. Minimal retropulsion at T4, similar to prior examination. The spinal canal appears widely patent. Soft tissues and spinal canal: No prevertebral fluid or swelling. No visible canal hematoma. Disc levels: Intervertebral disc spaces are preserved. The paraspinal soft tissues are unremarkable. Upper chest: Multiple osseous metastases are seen within the a visualized thoracic cage and thoracic spine. Masslike consolidation noted within the right apex. Other: Left internal jugular chest port partially visualized. IMPRESSION: No acute intracranial abnormality.  No calvarial fracture. Widespread osseous metastatic disease within the skull base and cervical spine. Pathologic fractures of the a right lateral body of C2, C4, and C5 which appear new since prior MRI examination. Fracture of C2 impinges the transverse foramen though the adjacent vertebral artery is not well assessed on this noncontrast examination. Lack of surrounding soft tissue swelling at this level suggests that this may be subacute to remote in etiology. No associated listhesis. Attempts are being made at this time to contact the managing clinician for direct communication of the critical findings. Electronically Signed: By: Fidela Salisbury MD On: 03/20/20 02:04   DG Chest Port 1 View  Result Date: 03-20-2020 CLINICAL DATA:  Initial evaluation for acute cough, history of lung cancer. EXAM: PORTABLE CHEST 1 VIEW COMPARISON:  Prior CT from 11/11/2019 FINDINGS: Left-sided Port-A-Cath in place with tip overlying the distal SVC. Heart size within normal limits. Mediastinal silhouette normal. Lungs hypoinflated. Approximate 4.5 cm nodular density overlying the mid left lung consistent with known metastatic disease. Few additional pleural based and/or osseous metastases noted, also seen on prior CT. Irregular pleuroparenchymal  scarring and/or architectural distortion present at the right perihilar region. Patchy and hazy opacity at the right lung base could reflect atelectasis or infiltrate. No pulmonary edema or pleural effusion. No pneumothorax. Probable healing pathologic  fracture noted at the right lower ribs. IMPRESSION: 1. Patchy and hazy opacity at the right lung base, which could reflect atelectasis or infiltrate. 2. 4.5 cm nodular density overlying the mid left lung, consistent with known metastatic disease. Few additional pleural based and/or osseous metastases noted as well, also seen on prior CT. 3. Probable healing pathologic fracture at the right lower ribs. Electronically Signed   By: Jeannine Boga M.D.   On: February 29, 2020 01:45    Microbiology: Recent Results (from the past 240 hour(s))  Resp Panel by RT-PCR (Flu A&B, Covid) Nasopharyngeal Swab     Status: None   Collection Time: 02/29/2020  1:02 AM   Specimen: Nasopharyngeal Swab; Nasopharyngeal(NP) swabs in vial transport medium  Result Value Ref Range Status   SARS Coronavirus 2 by RT PCR NEGATIVE NEGATIVE Final    Comment: (NOTE) SARS-CoV-2 target nucleic acids are NOT DETECTED.  The SARS-CoV-2 RNA is generally detectable in upper respiratory specimens during the acute phase of infection. The lowest concentration of SARS-CoV-2 viral copies this assay can detect is 138 copies/mL. A negative result does not preclude SARS-Cov-2 infection and should not be used as the sole basis for treatment or other patient management decisions. A negative result may occur with  improper specimen collection/handling, submission of specimen other than nasopharyngeal swab, presence of viral mutation(s) within the areas targeted by this assay, and inadequate number of viral copies(<138 copies/mL). A negative result must be combined with clinical observations, patient history, and epidemiological information. The expected result is Negative.  Fact Sheet for  Patients:  EntrepreneurPulse.com.au  Fact Sheet for Healthcare Providers:  IncredibleEmployment.be  This test is no t yet approved or cleared by the [place] FDA and  has been authorized for detection and/or diagnosis of SARS-CoV-2 by FDA under an Emergency Use Authorization (EUA). This EUA will remain  in effect (meaning this test can be used) for the duration of the COVID-19 declaration under Section 564(b)(1) of the Act, 21 U.S.C.section 360bbb-3(b)(1), unless the authorization is terminated  or revoked sooner.       Influenza A by PCR NEGATIVE NEGATIVE Final   Influenza B by PCR NEGATIVE NEGATIVE Final    Comment: (NOTE) The Xpert Xpress SARS-CoV-2/FLU/RSV plus assay is intended as an aid in the diagnosis of influenza from Nasopharyngeal swab specimens and should not be used as a sole basis for treatment. Nasal washings and aspirates are unacceptable for Xpert Xpress SARS-CoV-2/FLU/RSV testing.  Fact Sheet for Patients: EntrepreneurPulse.com.au  Fact Sheet for Healthcare Providers: IncredibleEmployment.be  This test is not yet approved or cleared by the [place] FDA and has been authorized for detection and/or diagnosis of SARS-CoV-2 by FDA under an Emergency Use Authorization (EUA). This EUA will remain in effect (meaning this test can be used) for the duration of the COVID-19 declaration under Section 564(b)(1) of the Act, 21 U.S.C. section 360bbb-3(b)(1), unless the authorization is terminated or revoked.  Performed at [facility], [place] [address], [place], [place] [address]      Labs: Basic Metabolic Panel: Recent Labs  Lab 29-Feb-2020 0038  NA 130*  K 4.6  CL 88*  CO2 25  GLUCOSE 94  BUN 70*  CREATININE 1.46*  CALCIUM 7.8*   Liver Function Tests: Recent Labs  Lab 29-Feb-2020 0038  AST 254*  ALT 153*  ALKPHOS 1,040*  BILITOT 4.0*  PROT 5.4*  ALBUMIN  2.4*   No results for input(s): LIPASE, AMYLASE in the last 168 hours. No results for input(s):  AMMONIA in the last 168 hours. CBC: Recent Labs  Lab 2020/03/06 0038  WBC 13.9*  NEUTROABS 12.8*  HGB 6.8*  HCT 23.2*  MCV 114.3*  PLT 7*   Cardiac Enzymes: No results for input(s): CKTOTAL, CKMB, CKMBINDEX, TROPONINI in the last 168 hours. D-Dimer No results for input(s): DDIMER in the last 72 hours. BNP: Invalid input(s): POCBNP CBG: No results for input(s): GLUCAP in the last 168 hours. Anemia work up No results for input(s): VITAMINB12, FOLATE, FERRITIN, TIBC, IRON, RETICCTPCT in the last 72 hours. Urinalysis    Component Value Date/Time   COLORURINE YELLOW 07/06/2019 1354   APPEARANCEUR CLEAR 07/06/2019 1354   LABSPEC 1.017 07/06/2019 1354   PHURINE 6.0 07/06/2019 1354   GLUCOSEU NEGATIVE 07/06/2019 1354   HGBUR MODERATE (A) 07/06/2019 1354   BILIRUBINUR NEGATIVE 07/06/2019 1354   KETONESUR 80 (A) 07/06/2019 1354   PROTEINUR 100 (A) 07/06/2019 1354   NITRITE NEGATIVE 07/06/2019 1354   LEUKOCYTESUR NEGATIVE 07/06/2019 1354   Sepsis Labs Invalid input(s): PROCALCITONIN,  WBC,  LACTICIDVEN     SIGNED:  Dwyane Dee, MD  [facility] 02/29/2020, 4:09 PM

## 2020-03-26 NOTE — Progress Notes (Signed)
Nutrition Brief Note  Patient screened for MST. Chart reviewed. She is noted to be a/o to self only.  Patient now transitioning to comfort care and son is interested in [facility] for EOL care.   Will change diet from Heart Healthy to Regular. No further nutrition interventions warranted at this time.  Please consult as needed.     Jarome Matin, MS, RD, LDN, CNSC Inpatient Clinical Dietitian RD pager # available in [place]  After hours/weekend pager # available in [facility]

## 2020-03-26 NOTE — TOC Progression Note (Signed)
Transition of Care [facility]) - Progression Note    Patient Details  Name: Ashley Mayo MRN: [id number] Date of Birth: December 31, 1965  [facility] CM/SW Contact  Rodney Wigger, Juliann Pulse, RN Phone Number: March 04, 2020, 1:31 PM  Clinical Narrative: Referral for residential hospice-spoke to sons-Tim/Nicholas-agree to residential hospice-Mary Ann rep w/[facility] will review-await outcome.      Expected Discharge Plan: [place] Barriers to Discharge: Continued Medical Work up  Expected Discharge Plan and Services Expected Discharge Plan: [place]   Discharge Planning Services: CM Consult Post Acute Care Choice: Resumption of Svcs/PTA Provider Living arrangements for the past 2 months: Single Family Home                                       Social Determinants of Health (SDOH) Interventions    Readmission Risk Interventions No flowsheet data found.

## 2020-03-26 NOTE — Assessment & Plan Note (Addendum)
-  Received 1 unit platelets on Mar 21, 2020.  No further transfusions after further goals of care discussed bedside with her sons

## 2020-03-26 NOTE — Progress Notes (Signed)
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 Despite treatments her cancer has persisted and her functional status has progressively declined especially over the past 1 month.  Her 2 sons report that her appetite has significantly decreased over the past 1 week prior to hospitalization and her mentation has started to become less lucid. Due to her severe lethargy and failure to thrive, she was brought to the hospital for further evaluation.  She had fallen out of the bed at home upon attempting to get up. She was placed on a nonrebreather in the ER and admitted for further monitoring.  Goals of care were discussed on admission and her sons wished to pursue comfort measures however trial of blood and platelet transfusions were attempted.  She became congested, more short of breath, and uncomfortable after transfusions.  This was discussed further bedside with her sons and transition to full comfort measures were then recommended and pursued.   Interval History:  Patient seen this morning with her 2 sons bedside.  She was noted to be mildly uncomfortable with congestion breathing sounds and had just received blood transfusion.  After chart review, we had further goals of care discussions and decision was made to pursue pure comfort care at this time given her global decline and poor chance of any meaningful recovery.  Patient's initial wish was to be able to pass at home however given the amount of oxygen support and how unstable she appears, we discussed that she may best be suited for discharging to [facility] if a bed is available.  Her sons were okay with this.  Old records reviewed in assessment of this patient  ROS: Review of systems not  obtained due to patient factors. Unresponsive/nonverbal   Assessment & Plan: * Metastatic lung cancer (metastasis from lung to other site) [facility]) -Originally diagnosed 2017.  Patient has undergone aggressive treatment since diagnosis however cancer has persisted and her functional status has continued to decline -At this time, given severe poor oral intake, cachectic state, frailty, failure to thrive, she appears to me to be approaching end-of-life with less than 2 weeks; this has been discussed in detail with her sons bedside who are in agreement with pursuing comfort care at this time and discontinuing further transfusions and unnecessary medications with focus now being comfort -Fentanyl and Ativan ordered for comfort use - given NRB in place do not think she is safe for d/c home with hospice; will see if we can place her in [facility] for end of life care  -Indwelling Foley for comfort appropriate  Failure to thrive in adult - see cancer   Thrombocytopenia ([place]) -Received 1 unit platelets on 03-25-2020.  No further transfusions after further goals of care discussed bedside with her sons   Symptomatic anemia - s/p 1 unit PRBC on 2020-03-25; patient became more short of breath and uncomfortable after transfusion -Lasix 40 mg IV x1 now  Essential hypertension - d/c meds   Antimicrobials: n/a  DVT prophylaxis: SCD Code Status: DNR Family Communication: sons bedside Disposition Plan: Status is: Inpatient  Remains inpatient appropriate because:Altered mental status, Inpatient level of care appropriate due to severity of illness and end of life care awaiting [facility]  Dispo: The patient is from: Home              Anticipated d/c is to: [facility]              Anticipated d/c date is: when bed avail               Objective: Blood pressure 106/70, pulse 99, temperature 97.6 F (36.4 C), resp. rate (!) 22, height 5\' 4"  (1.626 m), weight 39.5 kg, SpO2 100 %.   Examination: General appearance: minimally responsive; follows some commands; nonverbal Head: Normocephalic, without obvious abnormality, atraumatic Eyes: PERRL Lungs: Diffuse coarse breath sounds bilaterally, no wheezing Heart: regular rate and rhythm and S1, S2 normal Abdomen: Soft, nondistended, bowel sounds present Extremities: No edema Skin: mobility and turgor normal Neurologic: Squeezed fingers with her hands on command and wiggled toes but was nonverbal  Consultants:     Procedures:     Data Reviewed: I have personally reviewed following labs and imaging studies Results for orders placed or performed during the hospital encounter of 03/10/2020 (from the past 24 hour(s))  CBC with Differential/Platelet     Status: Abnormal   Collection Time: 03/19/2020 12:38 AM  Result Value Ref Range   WBC 13.9 (H) 4.0 - 10.5 K/uL   RBC 2.03 (L) 3.87 - 5.11 MIL/uL   Hemoglobin 6.8 (LL) 12.0 - 15.0 g/dL   HCT 23.2 (L) 36.0 - 46.0 %   MCV 114.3 (H) 80.0 - 100.0 fL   MCH 33.5 26.0 - 34.0 pg   MCHC 29.3 (L) 30.0 - 36.0 g/dL   RDW 32.2 (H) 11.5 - 15.5 %   Platelets 7 (LL) 150 - 400 K/uL   nRBC 13.2 (H) 0.0 - 0.2 %   Neutrophils Relative % 92 %   Neutro Abs 12.8 (H) 1.7 - 7.7 K/uL   Lymphocytes Relative 1 %   Lymphs Abs 0.2 (L) 0.7 - 4.0 K/uL   Monocytes Relative 5 %   Monocytes Absolute 0.7 0.1 - 1.0 K/uL   Eosinophils Relative 0 %   Eosinophils Absolute 0.0 0.0 - 0.5 K/uL   Basophils Relative 0 %   Basophils Absolute 0.0 0.0 - 0.1 K/uL   Immature Granulocytes 2 %   Abs Immature Granulocytes 0.23 (H) 0.00 - 0.07 K/uL   Schistocytes PRESENT    Polychromasia PRESENT   Comprehensive metabolic panel     Status: Abnormal   Collection Time: 03/19/2020 12:38 AM  Result Value Ref Range   Sodium 130 (L) 135 - 145 mmol/L   Potassium 4.6 3.5 - 5.1 mmol/L   Chloride 88 (L) 98 - 111 mmol/L   CO2 25 22 - 32 mmol/L   Glucose, Bld 94 70 - 99 mg/dL   BUN 70 (H) 6 - 20 mg/dL   Creatinine, Ser  1.46 (H) 0.44 - 1.00 mg/dL   Calcium 7.8 (L) 8.9 - 10.3 mg/dL   Total Protein 5.4 (L) 6.5 - 8.1 g/dL   Albumin 2.4 (L) 3.5 - 5.0 g/dL   AST 254 (H) 15 - 41 U/L   ALT 153 (H) 0 - 44 U/L   Alkaline Phosphatase 1,040 (H) 38 - 126 U/L   Total Bilirubin 4.0 (H) 0.3 - 1.2 mg/dL   GFR, Estimated 42 (L) >60 mL/min   Anion gap 17 (H) 5 - 15  Resp Panel by RT-PCR (Flu A&B, Covid) Nasopharyngeal Swab     Status: None   Collection Time: March 19, 2020  1:02 AM   Specimen: Nasopharyngeal  Swab; Nasopharyngeal(NP) swabs in vial transport medium  Result Value Ref Range   SARS Coronavirus 2 by RT PCR NEGATIVE NEGATIVE   Influenza A by PCR NEGATIVE NEGATIVE   Influenza B by PCR NEGATIVE NEGATIVE  POC occult blood, ED     Status: None   Collection Time: March 12, 2020  2:49 AM  Result Value Ref Range   Fecal Occult Bld NEGATIVE NEGATIVE  Prepare RBC (crossmatch)     Status: None   Collection Time: 2020-03-12  3:45 AM  Result Value Ref Range   Order Confirmation      ORDER PROCESSED BY BLOOD BANK Performed at [facility], [place] [address], [place], [place] [address]   Prepare platelet pheresis     Status: None (Preliminary result)   Collection Time: 03/12/2020  3:45 AM  Result Value Ref Range   Unit Number [id number]    Blood Component Type PLTP2 PSORALEN TREATED    Unit division 00    Status of Unit ISSUED    Transfusion Status      OK TO TRANSFUSE Performed at [place] [address], [place], [place] [address]   Type and screen [place]     Status: None (Preliminary result)   Collection Time: 03/12/20  3:45 AM  Result Value Ref Range   ABO/RH(D) O POS    Antibody Screen NEG    Sample Expiration 03/01/2020,2359    Unit Number [id number]    Blood Component Type RBC, LR IRR    Unit division 00    Status of Unit ISSUED    Transfusion Status OK TO TRANSFUSE    Crossmatch Result      Compatible Performed at [facility], [place] [address] [place], [place] [address]     Recent Results (from the past 240 hour(s))  Resp Panel by RT-PCR (Flu A&B, Covid) Nasopharyngeal Swab     Status: None   Collection Time: 2020/03/12  1:02 AM   Specimen: Nasopharyngeal Swab; Nasopharyngeal(NP) swabs in vial transport medium  Result Value Ref Range Status   SARS Coronavirus 2 by RT PCR NEGATIVE NEGATIVE Final    Comment: (NOTE) SARS-CoV-2 target nucleic acids are NOT DETECTED.  The SARS-CoV-2 RNA is generally detectable in upper respiratory specimens during the acute phase of infection. The lowest concentration of SARS-CoV-2 viral copies this assay can detect is 138 copies/mL. A negative result does not preclude SARS-Cov-2 infection and should not be used as the sole basis for treatment or other patient management decisions. A negative result may occur with  improper specimen collection/handling, submission of specimen other than nasopharyngeal swab, presence of viral mutation(s) within the areas targeted by this assay, and inadequate number of viral copies(<138 copies/mL). A negative result must be combined with clinical observations, patient history, and epidemiological information. The expected result is Negative.  Fact Sheet for Patients:  EntrepreneurPulse.com.au  Fact Sheet for Healthcare Providers:  IncredibleEmployment.be  This test is no t yet approved or cleared by the [place] FDA and  has been authorized for detection and/or diagnosis of SARS-CoV-2 by FDA under an Emergency Use Authorization (EUA). This EUA will remain  in effect (meaning this test can be used) for the duration of the COVID-19 declaration under Section 564(b)(1) of the Act, 21 U.S.C.section 360bbb-3(b)(1), unless the authorization is terminated  or revoked sooner.       Influenza A by PCR NEGATIVE NEGATIVE Final   Influenza B by PCR NEGATIVE NEGATIVE Final  Comment: (NOTE) The Xpert  Xpress SARS-CoV-2/FLU/RSV plus assay is intended as an aid in the diagnosis of influenza from Nasopharyngeal swab specimens and should not be used as a sole basis for treatment. Nasal washings and aspirates are unacceptable for Xpert Xpress SARS-CoV-2/FLU/RSV testing.  Fact Sheet for Patients: EntrepreneurPulse.com.au  Fact Sheet for Healthcare Providers: IncredibleEmployment.be  This test is not yet approved or cleared by the [place] FDA and has been authorized for detection and/or diagnosis of SARS-CoV-2 by FDA under an Emergency Use Authorization (EUA). This EUA will remain in effect (meaning this test can be used) for the duration of the COVID-19 declaration under Section 564(b)(1) of the Act, 21 U.S.C. section 360bbb-3(b)(1), unless the authorization is terminated or revoked.  Performed at [facility], [place] [address], [place], [place] [address]      Radiology Studies: DG Lumbar Spine 2-3 Views  Result Date: 17-Mar-2020 CLINICAL DATA:  Initial evaluation for acute trauma, fall, low back pain. EXAM: LUMBAR SPINE - 2-3 VIEW COMPARISON:  None. FINDINGS: Five non rib-bearing lumbar type vertebral bodies. Vertebral bodies normally aligned with preservation of the normal lumbar lordosis. No significant listhesis. Mild chronic appearing height loss noted at the superior endplate of L5. Vertebral body height otherwise maintained without visible acute fracture. Sacrum and pelvis intact. SI joints approximated. Underlying osteopenia noted. Moderate degenerative intervertebral disc space narrowing with facet arthrosis present at L5-S1. Intervertebral disc space height otherwise maintained. IM fixation nail partially visualize within the right femoral head. No visible soft tissue injury. IMPRESSION: 1. No radiographic evidence for acute abnormality within the lumbar spine. 2. Mild chronic height loss at the superior endplate of L5. 3.  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 Electronically Signed   By: Jeannine Boga M.D.   On: 03/17/2020 01:16   CT Head Wo Contrast  Addendum Date: Mar 17, 2020   ADDENDUM REPORT: 03/17/20 02:13 ADDENDUM: These results were called by telephone at the time of interpretation on March 17, 2020 at 2:10 am to provider [facility] , who verbally acknowledged these results. Electronically Signed   By: Fidela Salisbury MD   On: 03-17-2020 02:13   Result Date: 03/17/2020 CLINICAL DATA:  Metastatic lung cancer, fall, cervical spine fracture EXAM: CT HEAD WITHOUT CONTRAST CT CERVICAL SPINE WITHOUT CONTRAST TECHNIQUE: Multidetector CT imaging of the head and cervical spine was performed following the standard protocol without intravenous contrast. Multiplanar CT image reconstructions of the cervical spine were also generated. COMPARISON:  None. Findings are correlated with prior MRI of the thoracic spine of 11/14/2019 FINDINGS: CT HEAD FINDINGS Brain: Right occipital craniotomy has been performed with a small amount of focal encephalomalacia subjacent to the craniotomy flap. No evidence of acute intracranial hemorrhage or infarct. No abnormal mass effect or midline shift. No abnormal intra or extra-axial mass lesion or fluid collection. Ventricular size is normal. Cerebellum is unremarkable. Vascular: No asymmetric hyperdense vasculature at the skull base. Skull: Lytic lesions are seen involving the a skull base adjacent to the jugular foramen and intimately of all vein the left stylomastoid foramen and right occipital condyle compatible with metastatic osseous implants in this patient with known metastatic lung cancer. Additional metastatic implants noted within the right clivus subjacent to the foramen lacerum. No acute fracture. Sinuses/Orbits: The paranasal sinuses are clear. The orbits are unremarkable. Other: Mastoid air cells and middle ear cavities are clear. CT CERVICAL SPINE FINDINGS Alignment: Normal  cervical lordosis.  No listhesis. Skull base and vertebrae: Multiple osseous lytic lesions are identified: There is a  lytic lesion involving the right occipital condyle without associated pathologic fracture. Lytic lesion is seen within the body of C2 extending into the right lateral body of C2 and right pedicle of C2. There is associated pathologic fracture of the right body of C2 with compression and impingement of the a transverse foramen. Lytic lesion within the body of C3 without associated compression. Lytic lesion within the body of C4 with associated right paracentral compression fracture without associated retropulsion. The posterior elements appear intact. Lytic lesion within the body of C5 with left paracentral compression fracture with approximately 80% loss of height. The posterior elements are not involved. Mixed lytic and sclerotic metastasis within the body of C7 without associated pathologic fracture. Sclerotic metastases are again seen within the T3 and T4 vertebral bodies with associated compression fractures which appear progressive since prior MRI examination. Minimal retropulsion at T4, similar to prior examination. The spinal canal appears widely patent. Soft tissues and spinal canal: No prevertebral fluid or swelling. No visible canal hematoma. Disc levels: Intervertebral disc spaces are preserved. The paraspinal soft tissues are unremarkable. Upper chest: Multiple osseous metastases are seen within the a visualized thoracic cage and thoracic spine. Masslike consolidation noted within the right apex. Other: Left internal jugular chest port partially visualized. IMPRESSION: No acute intracranial abnormality.  No calvarial fracture. Widespread osseous metastatic disease within the skull base and cervical spine. Pathologic fractures of the a right lateral body of C2, C4, and C5 which appear new since prior MRI examination. Fracture of C2 impinges the transverse foramen though the adjacent  vertebral artery is not well assessed on this noncontrast examination. Lack of surrounding soft tissue swelling at this level suggests that this may be subacute to remote in etiology. No associated listhesis. Attempts are being made at this time to contact the managing clinician for direct communication of the critical findings. Electronically Signed: By: Fidela Salisbury MD On: 2020/03/12 02:04   CT Cervical Spine Wo Contrast  Addendum Date: 03/12/20   ADDENDUM REPORT: 2020-03-12 02:13 ADDENDUM: These results were called by telephone at the time of interpretation on 03/12/2020 at 2:10 am to provider [facility] , who verbally acknowledged these results. Electronically Signed   By: Fidela Salisbury MD   On: 03-12-2020 02:13   Result Date: 12-Mar-2020 CLINICAL DATA:  Metastatic lung cancer, fall, cervical spine fracture EXAM: CT HEAD WITHOUT CONTRAST CT CERVICAL SPINE WITHOUT CONTRAST TECHNIQUE: Multidetector CT imaging of the head and cervical spine was performed following the standard protocol without intravenous contrast. Multiplanar CT image reconstructions of the cervical spine were also generated. COMPARISON:  None. Findings are correlated with prior MRI of the thoracic spine of 11/14/2019 FINDINGS: CT HEAD FINDINGS Brain: Right occipital craniotomy has been performed with a small amount of focal encephalomalacia subjacent to the craniotomy flap. No evidence of acute intracranial hemorrhage or infarct. No abnormal mass effect or midline shift. No abnormal intra or extra-axial mass lesion or fluid collection. Ventricular size is normal. Cerebellum is unremarkable. Vascular: No asymmetric hyperdense vasculature at the skull base. Skull: Lytic lesions are seen involving the a skull base adjacent to the jugular foramen and intimately of all vein the left stylomastoid foramen and right occipital condyle compatible with metastatic osseous implants in this patient with known metastatic lung cancer. Additional  metastatic implants noted within the right clivus subjacent to the foramen lacerum. No acute fracture. Sinuses/Orbits: The paranasal sinuses are clear. The orbits are unremarkable. Other: Mastoid air cells and middle ear cavities are clear. CT  CERVICAL SPINE FINDINGS Alignment: Normal cervical lordosis.  No listhesis. Skull base and vertebrae: Multiple osseous lytic lesions are identified: There is a lytic lesion involving the right occipital condyle without associated pathologic fracture. Lytic lesion is seen within the body of C2 extending into the right lateral body of C2 and right pedicle of C2. There is associated pathologic fracture of the right body of C2 with compression and impingement of the a transverse foramen. Lytic lesion within the body of C3 without associated compression. Lytic lesion within the body of C4 with associated right paracentral compression fracture without associated retropulsion. The posterior elements appear intact. Lytic lesion within the body of C5 with left paracentral compression fracture with approximately 80% loss of height. The posterior elements are not involved. Mixed lytic and sclerotic metastasis within the body of C7 without associated pathologic fracture. Sclerotic metastases are again seen within the T3 and T4 vertebral bodies with associated compression fractures which appear progressive since prior MRI examination. Minimal retropulsion at T4, similar to prior examination. The spinal canal appears widely patent. Soft tissues and spinal canal: No prevertebral fluid or swelling. No visible canal hematoma. Disc levels: Intervertebral disc spaces are preserved. The paraspinal soft tissues are unremarkable. Upper chest: Multiple osseous metastases are seen within the a visualized thoracic cage and thoracic spine. Masslike consolidation noted within the right apex. Other: Left internal jugular chest port partially visualized. IMPRESSION: No acute intracranial abnormality.  No  calvarial fracture. Widespread osseous metastatic disease within the skull base and cervical spine. Pathologic fractures of the a right lateral body of C2, C4, and C5 which appear new since prior MRI examination. Fracture of C2 impinges the transverse foramen though the adjacent vertebral artery is not well assessed on this noncontrast examination. Lack of surrounding soft tissue swelling at this level suggests that this may be subacute to remote in etiology. No associated listhesis. Attempts are being made at this time to contact the managing clinician for direct communication of the critical findings. Electronically Signed: By: Fidela Salisbury MD On: 03/19/2020 02:04   DG Chest Port 1 View  Result Date: 2020/03/19 CLINICAL DATA:  Initial evaluation for acute cough, history of lung cancer. EXAM: PORTABLE CHEST 1 VIEW COMPARISON:  Prior CT from 11/11/2019 FINDINGS: Left-sided Port-A-Cath in place with tip overlying the distal SVC. Heart size within normal limits. Mediastinal silhouette normal. Lungs hypoinflated. Approximate 4.5 cm nodular density overlying the mid left lung consistent with known metastatic disease. Few additional pleural based and/or osseous metastases noted, also seen on prior CT. Irregular pleuroparenchymal scarring and/or architectural distortion present at the right perihilar region. Patchy and hazy opacity at the right lung base could reflect atelectasis or infiltrate. No pulmonary edema or pleural effusion. No pneumothorax. Probable healing pathologic fracture noted at the right lower ribs. IMPRESSION: 1. Patchy and hazy opacity at the right lung base, which could reflect atelectasis or infiltrate. 2. 4.5 cm nodular density overlying the mid left lung, consistent with known metastatic disease. Few additional pleural based and/or osseous metastases noted as well, also seen on prior CT. 3. Probable healing pathologic fracture at the right lower ribs. Electronically Signed   By: Jeannine Boga M.D.   On: 03-19-20 01:45   CT Head Wo Contrast  Final Result  Addendum 1 of 1  ADDENDUM REPORT: March 19, 2020 02:13    ADDENDUM:  These results were called by telephone at the time of interpretation  on Mar 19, 2020 at 2:10 am to provider [facility] , who verbally  acknowledged these results.      Electronically Signed    By: Fidela Salisbury MD    On: March 04, 2020 02:13      Final    CT Cervical Spine Wo Contrast  Final Result  Addendum 1 of 1  ADDENDUM REPORT: 03-04-20 02:13    ADDENDUM:  These results were called by telephone at the time of interpretation  on 2020/03/04 at 2:10 am to provider [facility] , who verbally  acknowledged these results.      Electronically Signed    By: Fidela Salisbury MD    On: March 04, 2020 02:13      Final    DG Chest Port 1 View  Final Result    DG Lumbar Spine 2-3 Views  Final Result      Scheduled Meds: . fentaNYL  1 patch Transdermal Q72H   PRN Meds: acetaminophen **OR** acetaminophen, albuterol, fentaNYL (SUBLIMAZE) injection, LORazepam Continuous Infusions: . sodium chloride Stopped (2020/03/04 0328)     LOS: 0 days  Time spent: Greater than 50% of the 35 minute visit was spent in counseling/coordination of care for the patient as laid out in the A&P.   Dwyane Dee, MD [facility] 04-Mar-2020, 3:40 PM

## 2020-03-26 NOTE — Assessment & Plan Note (Signed)
-   d/c meds

## 2020-03-26 NOTE — Progress Notes (Signed)
Chaplain engaged in initial visit with Ashley Mayo and her three children.  Ashley Mayo also has another son who was not able to be present. During visit, chaplain learned that Ashley Mayo is a devoted "meme" (grandmother) and mom.  Her children shared how resilient and strong she has been concerning them.  She was able to work and fight through her battle with addiction and incarceration to be be a mother to them.  They stated that they were able to live with her about eight years. Ashley Mayo's children noted how she has taught them how to be great parents to their own children.    Ashley Mayo only daughter is a Theme park manager in [place].  Chaplain affirmed Ashley Mayo being covered by love and care from her children.  Chaplain also affirmed the ways in which they have shown up for their mother as she has conveyed being scared of being or dying alone. Chaplain offered ministries of listening, presence and prayer with them.    Chaplain is available to follow-up as needed.    03/17/2020 1000  Clinical Encounter Type  Visited With Patient and family together  Visit Type Initial;Spiritual support

## 2020-03-26 NOTE — Progress Notes (Signed)
Pt restless and no urine noted at this time. Bladder scan results 400. End of Life Foley Catheter inserted with immediate results of 800 drained of amber urine. Family educated and pt tolerated procedure well. Maintain current plan of care

## 2020-03-26 NOTE — TOC Progression Note (Signed)
Transition of Care [facility]) - Progression Note    Patient Details  Name: Dylin Ihnen MRN: [id number] Date of Birth: [birth year]  Transition of Care [facility]) CM/SW Contact  Collier Monica, Juliann Pulse, RN Phone Number: 03-11-2020, 12:16 PM  Clinical Narrative: If residential hospice is appropriate-can arrange with referral.      Expected Discharge Plan: Home w Hospice Care Barriers to Discharge: Continued Medical Work up  Expected Discharge Plan and Services Expected Discharge Plan: [place]   Discharge Planning Services: CM Consult Post Acute Care Choice: Resumption of Svcs/PTA Provider Living arrangements for the past 2 months: Single Family Home                                       Social Determinants of Health (SDOH) Interventions    Readmission Risk Interventions No flowsheet data found.

## 2020-03-26 NOTE — ED Notes (Signed)
ED TO INPATIENT HANDOFF REPORT  Name/Age/Gender Ashley Mayo 55 y.o. female  Code Status Code Status History    Date Active Date Inactive Code Status Order ID Comments User Context   11/12/2019 0004 11/15/2019 0022 Full Code [id number]  Neena Rhymes, MD ED   07/06/2019 1339 07/11/2019 2225 Full Code [id number]  Erick Colace, NP ED   01/28/2019 0127 01/28/2019 1726 Full Code [id number]  Gwynne Edinger, MD Inpatient   01/25/2019 1530 01/26/2019 1705 Full Code [id number]  Georgette Shell, MD ED   01/23/2016 1853 01/27/2016 1616 Full Code [id number]  Mariel Aloe, MD ED   Advance Care Planning Activity    Questions for Most Recent Historical Code Status (Order [id number])       Home/SNF/Other Home  Chief Complaint Symptomatic anemia [D64.9]  Level of Care/Admitting Diagnosis ED Disposition    ED Disposition Condition [place]: [place] [[id number]]  Level of Care: Telemetry [5]  Admit to tele based on following criteria: Monitor for Ischemic changes  Covid Evaluation: Asymptomatic Screening Protocol (No Symptoms)  Diagnosis: Symptomatic anemia [9622297]  Admitting Physician: Rise Patience [phone]  Attending Physician: Rise Patience [phone]       Medical History Past Medical History:  Diagnosis Date  . Anxiety   . Asthma   . Cancer ([place])    lung cancer  . COPD (chronic obstructive pulmonary disease) ([place])   . Depression   . GERD (gastroesophageal reflux disease)   . Hypertension   . Migraine     Allergies Allergies  Allergen Reactions  . Codeine Hives  . Shellfish Allergy Hives    IV Location/Drains/Wounds Patient Lines/Drains/Airways Status    Active Line/Drains/Airways    Name Placement date Placement time Site Days   Implanted Port Left Chest -  -  Chest  -   Peripheral IV 11/11/19 Right Antecubital 11/11/19  1938  Antecubital  108   Pressure Injury 07/06/19 Shoulder Lateral;Right;Upper Stage  1 -  Intact skin with non-blanchable redness of a localized area usually over a bony prominence. 07/06/19  1600  - 236   Pressure Injury 07/09/19 Face Upper;Mid Stage 1 -  Intact skin with non-blanchable redness of a localized area usually over a bony prominence. Dark pink/purple oval from under pulse ox sticker. 07/09/19  1400  - 233          Labs/Imaging Results for orders placed or performed during the hospital encounter of 03/24/2020 (from the past 48 hour(s))  CBC with Differential/Platelet     Status: Abnormal   Collection Time: 2020-03-11 12:38 AM  Result Value Ref Range   WBC 13.9 (H) 4.0 - 10.5 K/uL   RBC 2.03 (L) 3.87 - 5.11 MIL/uL   Hemoglobin 6.8 (LL) 12.0 - 15.0 g/dL    Comment: REPEATED TO VERIFY THIS CRITICAL RESULT HAS VERIFIED AND BEEN CALLED TO RN J TALKINGTON BY ALEXIS CRUICKSHANK ON 01 04 2022 AT 0141, AND HAS BEEN READ BACK.     HCT 23.2 (L) 36.0 - 46.0 %   MCV 114.3 (H) 80.0 - 100.0 fL   MCH 33.5 26.0 - 34.0 pg   MCHC 29.3 (L) 30.0 - 36.0 g/dL   RDW 32.2 (H) 11.5 - 15.5 %   Platelets 7 (LL) 150 - 400 K/uL    Comment: REPEATED TO VERIFY PLATELET COUNT CONFIRMED BY SMEAR SPECIMEN CHECKED FOR CLOTS THIS CRITICAL RESULT HAS VERIFIED AND BEEN CALLED TO RN J TALKINGTON BY ALEXIS  [place] ON March 05, 2020 AT 0141, AND HAS BEEN READ BACK.     nRBC 13.2 (H) 0.0 - 0.2 %   Neutrophils Relative % 92 %   Neutro Abs 12.8 (H) 1.7 - 7.7 K/uL   Lymphocytes Relative 1 %   Lymphs Abs 0.2 (L) 0.7 - 4.0 K/uL   Monocytes Relative 5 %   Monocytes Absolute 0.7 0.1 - 1.0 K/uL   Eosinophils Relative 0 %   Eosinophils Absolute 0.0 0.0 - 0.5 K/uL   Basophils Relative 0 %   Basophils Absolute 0.0 0.0 - 0.1 K/uL   Immature Granulocytes 2 %   Abs Immature Granulocytes 0.23 (H) 0.00 - 0.07 K/uL   Schistocytes PRESENT    Polychromasia PRESENT     Comment: Performed at [facility], [place] [address], [place], [place] [address]  Comprehensive metabolic panel     Status:  Abnormal   Collection Time: 03/05/20 12:38 AM  Result Value Ref Range   Sodium 130 (L) 135 - 145 mmol/L   Potassium 4.6 3.5 - 5.1 mmol/L   Chloride 88 (L) 98 - 111 mmol/L   CO2 25 22 - 32 mmol/L   Glucose, Bld 94 70 - 99 mg/dL    Comment: Glucose reference range applies only to samples taken after fasting for at least 8 hours.   BUN 70 (H) 6 - 20 mg/dL   Creatinine, Ser 1.46 (H) 0.44 - 1.00 mg/dL   Calcium 7.8 (L) 8.9 - 10.3 mg/dL   Total Protein 5.4 (L) 6.5 - 8.1 g/dL   Albumin 2.4 (L) 3.5 - 5.0 g/dL   AST 254 (H) 15 - 41 U/L   ALT 153 (H) 0 - 44 U/L   Alkaline Phosphatase 1,040 (H) 38 - 126 U/L   Total Bilirubin 4.0 (H) 0.3 - 1.2 mg/dL   GFR, Estimated 42 (L) >60 mL/min    Comment: (NOTE) Calculated using the CKD-EPI Creatinine Equation (2021)    Anion gap 17 (H) 5 - 15    Comment: Performed at [facility], [place] [address], [place], [place] [address]  Resp Panel by RT-PCR (Flu A&B, Covid) Nasopharyngeal Swab     Status: None   Collection Time: 2020-03-05  1:02 AM   Specimen: Nasopharyngeal Swab; Nasopharyngeal(NP) swabs in vial transport medium  Result Value Ref Range   SARS Coronavirus 2 by RT PCR NEGATIVE NEGATIVE    Comment: (NOTE) SARS-CoV-2 target nucleic acids are NOT DETECTED.  The SARS-CoV-2 RNA is generally detectable in upper respiratory specimens during the acute phase of infection. The lowest concentration of SARS-CoV-2 viral copies this assay can detect is 138 copies/mL. A negative result does not preclude SARS-Cov-2 infection and should not be used as the sole basis for treatment or other patient management decisions. A negative result may occur with  improper specimen collection/handling, submission of specimen other than nasopharyngeal swab, presence of viral mutation(s) within the areas targeted by this assay, and inadequate number of viral copies(<138 copies/mL). A negative result must be combined with clinical observations, patient  history, and epidemiological information. The expected result is Negative.  Fact Sheet for Patients:  EntrepreneurPulse.com.au  Fact Sheet for Healthcare Providers:  IncredibleEmployment.be  This test is no t yet approved or cleared by the [place] FDA and  has been authorized for detection and/or diagnosis of SARS-CoV-2 by FDA under an Emergency Use Authorization (EUA). This EUA will remain  in effect (meaning this test can be used) for the duration of  the COVID-19 declaration under Section 564(b)(1) of the Act, 21 U.S.C.section 360bbb-3(b)(1), unless the authorization is terminated  or revoked sooner.       Influenza A by PCR NEGATIVE NEGATIVE   Influenza B by PCR NEGATIVE NEGATIVE    Comment: (NOTE) The Xpert Xpress SARS-CoV-2/FLU/RSV plus assay is intended as an aid in the diagnosis of influenza from Nasopharyngeal swab specimens and should not be used as a sole basis for treatment. Nasal washings and aspirates are unacceptable for Xpert Xpress SARS-CoV-2/FLU/RSV testing.  Fact Sheet for Patients: EntrepreneurPulse.com.au  Fact Sheet for Healthcare Providers: IncredibleEmployment.be  This test is not yet approved or cleared by the [place] FDA and has been authorized for detection and/or diagnosis of SARS-CoV-2 by FDA under an Emergency Use Authorization (EUA). This EUA will remain in effect (meaning this test can be used) for the duration of the COVID-19 declaration under Section 564(b)(1) of the Act, 21 U.S.C. section 360bbb-3(b)(1), unless the authorization is terminated or revoked.  Performed at [facility], [place] [address] [place], [place] [address]   POC occult blood, ED     Status: None   Collection Time: 03-26-2020  2:49 AM  Result Value Ref Range   Fecal Occult Bld NEGATIVE NEGATIVE   DG Lumbar Spine 2-3 Views  Result Date: 26-Mar-2020 CLINICAL DATA:   Initial evaluation for acute trauma, fall, low back pain. EXAM: LUMBAR SPINE - 2-3 VIEW COMPARISON:  None. FINDINGS: Five non rib-bearing lumbar type vertebral bodies. Vertebral bodies normally aligned with preservation of the normal lumbar lordosis. No significant listhesis. Mild chronic appearing height loss noted at the superior endplate of L5. Vertebral body height otherwise maintained without visible acute fracture. Sacrum and pelvis intact. SI joints approximated. Underlying osteopenia noted. Moderate degenerative intervertebral disc space narrowing with facet arthrosis present at L5-S1. Intervertebral disc space height otherwise maintained. IM fixation nail partially visualize within the right femoral head. No visible soft tissue injury. IMPRESSION: 1. No radiographic evidence for acute abnormality within the lumbar spine. 2. Mild chronic height loss at the superior endplate of L5. 3. 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 Electronically Signed   By: Jeannine Boga M.D.   On: 03-26-20 01:16   CT Head Wo Contrast  Addendum Date: 26-Mar-2020   ADDENDUM REPORT: Mar 26, 2020 02:13 ADDENDUM: These results were called by telephone at the time of interpretation on 03-26-20 at 2:10 am to provider [facility] , who verbally acknowledged these results. Electronically Signed   By: Fidela Salisbury MD   On: 26-Mar-2020 02:13   Result Date: 2020/03/26 CLINICAL DATA:  Metastatic lung cancer, fall, cervical spine fracture EXAM: CT HEAD WITHOUT CONTRAST CT CERVICAL SPINE WITHOUT CONTRAST TECHNIQUE: Multidetector CT imaging of the head and cervical spine was performed following the standard protocol without intravenous contrast. Multiplanar CT image reconstructions of the cervical spine were also generated. COMPARISON:  None. Findings are correlated with prior MRI of the thoracic spine of 11/14/2019 FINDINGS: CT HEAD FINDINGS Brain: Right occipital craniotomy has been performed with a small amount of focal  encephalomalacia subjacent to the craniotomy flap. No evidence of acute intracranial hemorrhage or infarct. No abnormal mass effect or midline shift. No abnormal intra or extra-axial mass lesion or fluid collection. Ventricular size is normal. Cerebellum is unremarkable. Vascular: No asymmetric hyperdense vasculature at the skull base. Skull: Lytic lesions are seen involving the a skull base adjacent to the jugular foramen and intimately of all vein the left stylomastoid foramen and right occipital condyle compatible with metastatic  osseous implants in this patient with known metastatic lung cancer. Additional metastatic implants noted within the right clivus subjacent to the foramen lacerum. No acute fracture. Sinuses/Orbits: The paranasal sinuses are clear. The orbits are unremarkable. Other: Mastoid air cells and middle ear cavities are clear. CT CERVICAL SPINE FINDINGS Alignment: Normal cervical lordosis.  No listhesis. Skull base and vertebrae: Multiple osseous lytic lesions are identified: There is a lytic lesion involving the right occipital condyle without associated pathologic fracture. Lytic lesion is seen within the body of C2 extending into the right lateral body of C2 and right pedicle of C2. There is associated pathologic fracture of the right body of C2 with compression and impingement of the a transverse foramen. Lytic lesion within the body of C3 without associated compression. Lytic lesion within the body of C4 with associated right paracentral compression fracture without associated retropulsion. The posterior elements appear intact. Lytic lesion within the body of C5 with left paracentral compression fracture with approximately 80% loss of height. The posterior elements are not involved. Mixed lytic and sclerotic metastasis within the body of C7 without associated pathologic fracture. Sclerotic metastases are again seen within the T3 and T4 vertebral bodies with associated compression fractures  which appear progressive since prior MRI examination. Minimal retropulsion at T4, similar to prior examination. The spinal canal appears widely patent. Soft tissues and spinal canal: No prevertebral fluid or swelling. No visible canal hematoma. Disc levels: Intervertebral disc spaces are preserved. The paraspinal soft tissues are unremarkable. Upper chest: Multiple osseous metastases are seen within the a visualized thoracic cage and thoracic spine. Masslike consolidation noted within the right apex. Other: Left internal jugular chest port partially visualized. IMPRESSION: No acute intracranial abnormality.  No calvarial fracture. Widespread osseous metastatic disease within the skull base and cervical spine. Pathologic fractures of the a right lateral body of C2, C4, and C5 which appear new since prior MRI examination. Fracture of C2 impinges the transverse foramen though the adjacent vertebral artery is not well assessed on this noncontrast examination. Lack of surrounding soft tissue swelling at this level suggests that this may be subacute to remote in etiology. No associated listhesis. Attempts are being made at this time to contact the managing clinician for direct communication of the critical findings. Electronically Signed: By: Fidela Salisbury MD On: March 20, 2020 02:04   CT Cervical Spine Wo Contrast  Addendum Date: 03-20-20   ADDENDUM REPORT: 03-20-2020 02:13 ADDENDUM: These results were called by telephone at the time of interpretation on 2020-03-20 at 2:10 am to provider [facility] , who verbally acknowledged these results. Electronically Signed   By: Fidela Salisbury MD   On: 2020/03/20 02:13   Result Date: 2020/03/20 CLINICAL DATA:  Metastatic lung cancer, fall, cervical spine fracture EXAM: CT HEAD WITHOUT CONTRAST CT CERVICAL SPINE WITHOUT CONTRAST TECHNIQUE: Multidetector CT imaging of the head and cervical spine was performed following the standard protocol without intravenous contrast.  Multiplanar CT image reconstructions of the cervical spine were also generated. COMPARISON:  None. Findings are correlated with prior MRI of the thoracic spine of 11/14/2019 FINDINGS: CT HEAD FINDINGS Brain: Right occipital craniotomy has been performed with a small amount of focal encephalomalacia subjacent to the craniotomy flap. No evidence of acute intracranial hemorrhage or infarct. No abnormal mass effect or midline shift. No abnormal intra or extra-axial mass lesion or fluid collection. Ventricular size is normal. Cerebellum is unremarkable. Vascular: No asymmetric hyperdense vasculature at the skull base. Skull: Lytic lesions are seen involving the a  skull base adjacent to the jugular foramen and intimately of all vein the left stylomastoid foramen and right occipital condyle compatible with metastatic osseous implants in this patient with known metastatic lung cancer. Additional metastatic implants noted within the right clivus subjacent to the foramen lacerum. No acute fracture. Sinuses/Orbits: The paranasal sinuses are clear. The orbits are unremarkable. Other: Mastoid air cells and middle ear cavities are clear. CT CERVICAL SPINE FINDINGS Alignment: Normal cervical lordosis.  No listhesis. Skull base and vertebrae: Multiple osseous lytic lesions are identified: There is a lytic lesion involving the right occipital condyle without associated pathologic fracture. Lytic lesion is seen within the body of C2 extending into the right lateral body of C2 and right pedicle of C2. There is associated pathologic fracture of the right body of C2 with compression and impingement of the a transverse foramen. Lytic lesion within the body of C3 without associated compression. Lytic lesion within the body of C4 with associated right paracentral compression fracture without associated retropulsion. The posterior elements appear intact. Lytic lesion within the body of C5 with left paracentral compression fracture with  approximately 80% loss of height. The posterior elements are not involved. Mixed lytic and sclerotic metastasis within the body of C7 without associated pathologic fracture. Sclerotic metastases are again seen within the T3 and T4 vertebral bodies with associated compression fractures which appear progressive since prior MRI examination. Minimal retropulsion at T4, similar to prior examination. The spinal canal appears widely patent. Soft tissues and spinal canal: No prevertebral fluid or swelling. No visible canal hematoma. Disc levels: Intervertebral disc spaces are preserved. The paraspinal soft tissues are unremarkable. Upper chest: Multiple osseous metastases are seen within the a visualized thoracic cage and thoracic spine. Masslike consolidation noted within the right apex. Other: Left internal jugular chest port partially visualized. IMPRESSION: No acute intracranial abnormality.  No calvarial fracture. Widespread osseous metastatic disease within the skull base and cervical spine. Pathologic fractures of the a right lateral body of C2, C4, and C5 which appear new since prior MRI examination. Fracture of C2 impinges the transverse foramen though the adjacent vertebral artery is not well assessed on this noncontrast examination. Lack of surrounding soft tissue swelling at this level suggests that this may be subacute to remote in etiology. No associated listhesis. Attempts are being made at this time to contact the managing clinician for direct communication of the critical findings. Electronically Signed: By: Fidela Salisbury MD On: 03-09-20 02:04   DG Chest Port 1 View  Result Date: 09-Mar-2020 CLINICAL DATA:  Initial evaluation for acute cough, history of lung cancer. EXAM: PORTABLE CHEST 1 VIEW COMPARISON:  Prior CT from 11/11/2019 FINDINGS: Left-sided Port-A-Cath in place with tip overlying the distal SVC. Heart size within normal limits. Mediastinal silhouette normal. Lungs hypoinflated. Approximate  4.5 cm nodular density overlying the mid left lung consistent with known metastatic disease. Few additional pleural based and/or osseous metastases noted, also seen on prior CT. Irregular pleuroparenchymal scarring and/or architectural distortion present at the right perihilar region. Patchy and hazy opacity at the right lung base could reflect atelectasis or infiltrate. No pulmonary edema or pleural effusion. No pneumothorax. Probable healing pathologic fracture noted at the right lower ribs. IMPRESSION: 1. Patchy and hazy opacity at the right lung base, which could reflect atelectasis or infiltrate. 2. 4.5 cm nodular density overlying the mid left lung, consistent with known metastatic disease. Few additional pleural based and/or osseous metastases noted as well, also seen on prior CT. 3. Probable healing  pathologic fracture at the right lower ribs. Electronically Signed   By: Jeannine Boga M.D.   On: 2020-02-29 01:45    Pending Labs Unresulted Labs (From admission, onward)          Start     Ordered   02-29-20 0329  Type and screen [place]  Once,   STAT       Comments: [place]    2020-02-29 0328   02-29-20 0318  Prepare RBC (crossmatch)  (Adult Blood Administration - PRBC)  Once,   R       Question Answer Comment  # of Units 1 unit   Transfusion Indications Symptomatic Anemia   Special Requirements Irradiated   Number of Units to Keep Ahead NO units ahead   Instructions: Transfuse   If emergent release call blood bank [facility] [phone]      2020-02-29 0318   02-29-20 0318  Prepare platelet pheresis  (Adult Blood Adminstration - Platelets (Pheresed))  Once,   R       Question Answer Comment  Number of Apheresis Units 1 unit   Transfusion Indications PLT Count </=10,000/mm   Date/Time blood product needed For transfusion   If emergent release call blood bank Not emergent release      02-29-20 0318   Feb 29, 2020 0039  Urinalysis,  Routine w reflex microscopic  ONCE - STAT,   STAT        02/29/20 0042          Vitals/Pain Today's Vitals   Feb 29, 2020 0330 02-29-2020 0345 Feb 29, 2020 0400 02-29-2020 0415  BP: 108/73 113/78 95/76 112/81  Pulse: 87 88 88 88  Resp:  (!) 30 (!) 25 (!) 24  Temp:      TempSrc:      SpO2: 100% 100% 100% 100%  PainSc:        Isolation Precautions No active isolations  Medications Medications  sodium chloride 0.9 % bolus 1,000 mL (1,000 mLs Intravenous New Bag/Given 2020-02-29 0133)    Followed by  0.9 %  sodium chloride infusion (1,000 mLs Intravenous New Bag/Given 29-Feb-2020 0304)  fentaNYL (SUBLIMAZE) injection 25 mcg (has no administration in time range)  0.9 %  sodium chloride infusion (0 mL/hr Intravenous Hold 02/29/20 0328)  0.9 %  sodium chloride infusion (0 mL/hr Intravenous Hold 29-Feb-2020 0328)  sodium chloride 0.9 % bolus 1,000 mL (1,000 mLs Intravenous New Bag/Given 02-29-2020 0304)    Mobility non-ambulatory

## 2020-03-26 NOTE — Assessment & Plan Note (Signed)
-   see cancer

## 2020-03-26 NOTE — Progress Notes (Signed)
Engineer, maintenance [facility]) Hospital Liaison note.   Received request from [place] for family interest in [facility]. [place] is unable to offer a room today. Spoke to son Octavia Bruckner who is aware of his mothers status and agrees with [employer] once bed available.   Hospital Liaison will follow up tomorrow or sooner if a room becomes available.   Please do not hesitate to call with questions.   Thank you,  Clementeen Hoof, BSN, RN [place] (listed on AMION under [facility]osp[facility] and [place] [facility] [place])   [phone]

## 2020-03-26 NOTE — TOC Progression Note (Signed)
Transition of Care [facility]) - Progression Note    Patient Details  Name: Alyze Lauf MRN: [id number] Date of Birth: [birth year]  Transition of Care [facility]) CM/SW Contact  Sahirah Rudell, Juliann Pulse, RN Phone Number: 03-04-20, 3:25 PM  Clinical Narrative:  [place] accept once bed is available-they will follow up tomorrow.     Expected Discharge Plan: [place] Barriers to Discharge: Continued Medical Work up  Expected Discharge Plan and Services Expected Discharge Plan: [place]   Discharge Planning Services: CM Consult Post Acute Care Choice: Resumption of Svcs/PTA Provider Living arrangements for the past 2 months: Single Family Home                                       Social Determinants of Health (SDOH) Interventions    Readmission Risk Interventions No flowsheet data found.

## 2020-03-26 NOTE — TOC Initial Note (Signed)
Transition of Care [facility]) - Initial/Assessment Note    Patient Details  Name: Ashley Mayo MRN: [id number] Date of Birth: 01-24-1966  [facility]) CM/SW Contact:    Dessa Phi, RN Phone Number: 03-13-2020, 10:01 AM  Clinical Narrative:  Spoke to son Tim-d/c plans return back home. Active w/Community care & hospice services-has home 02. Family plan to transport home if appropriate.                 Expected Discharge Plan: Home w Hospice Care Barriers to Discharge: Continued Medical Work up   Patient Goals and CMS Choice Patient states their goals for this hospitalization and ongoing recovery are:: return back home w/hospice services CMS Medicare.gov Compare Post Acute Care list provided to:: Patient Represenative (must comment) (Tim son 16 [phone]) Choice offered to / list presented to : Adult Children  Expected Discharge Plan and Services Expected Discharge Plan: Home w Hospice Care   Discharge Planning Services: CM Consult Post Acute Care Choice: Resumption of Svcs/PTA Provider Living arrangements for the past 2 months: Single Family Home                                      Prior Living Arrangements/Services Living arrangements for the past 2 months: Single Family Home Lives with:: Adult Children Patient language and need for interpreter reviewed:: Yes Do you feel safe going back to the place where you live?: Yes      Need for Family Participation in Patient Care: No (Comment) Care giver support system in place?: Yes (comment) Current home services: DME,Other (comment) (cane,rw,home oxygen-Community Care & hospice services.) Criminal Activity/Legal Involvement Pertinent to Current Situation/Hospitalization: No - Comment as needed  Activities of Daily Living Home Assistive Devices/Equipment: Walker (specify type) ADL Screening (condition at time of admission) Patient's cognitive ability adequate to safely complete daily activities?: No Is the  patient deaf or have difficulty hearing?: No Does the patient have difficulty seeing, even when wearing glasses/contacts?: Yes Does the patient have difficulty concentrating, remembering, or making decisions?: Yes Patient able to express need for assistance with ADLs?: Yes Does the patient have difficulty dressing or bathing?: Yes Independently performs ADLs?: No Communication: Dependent Is this a change from baseline?: Pre-admission baseline Dressing (OT): Dependent Is this a change from baseline?: Pre-admission baseline Grooming: Dependent Is this a change from baseline?: Pre-admission baseline Feeding: Dependent Is this a change from baseline?: Pre-admission baseline Bathing: Dependent Is this a change from baseline?: Pre-admission baseline Toileting: Dependent Is this a change from baseline?: Pre-admission baseline In/Out Bed: Dependent Is this a change from baseline?: Pre-admission baseline Walks in Home: Dependent Is this a change from baseline?: Pre-admission baseline Does the patient have difficulty walking or climbing stairs?: No Weakness of Legs: Both Weakness of Arms/Hands: Both  Permission Sought/Granted Permission sought to share information with : Case Manager Permission granted to share information with : Yes, Verbal Permission Granted  Share Information with NAME: Case Manager     Permission granted to share info w Relationship: Tim son [phone]     Emotional Assessment Appearance:: Appears stated age Attitude/Demeanor/Rapport: Gracious Affect (typically observed): Accepting Orientation: : Oriented to Self,Oriented to  Time,Oriented to Place,Oriented to Situation Alcohol / Substance Use: Not Applicable Psych Involvement: No (comment)  Admission diagnosis:  Cough [R05.9] Adult failure to thrive syndrome [R62.7] Anemia due to antineoplastic chemotherapy [D64.81, T45.1X5A] Failure to thrive in adult [R62.7]  Other secondary thrombocytopenia  [D69.59] Symptomatic anemia [D64.9] Patient Active Problem List   Diagnosis Date Noted  . Failure to thrive in adult 20-Mar-2020  . Community acquired pneumonia   . Tachycardia   . Sinus tachycardia   . Closed wedge compression fracture of T8 vertebra ([place])   . Gastroesophageal reflux disease   . Anemia   . Depression   . COPD with acute bronchitis ([place]) 11/11/2019  . Bronchitis 11/11/2019  . Hypoxemia 11/11/2019  . Pneumonia of both lungs due to Streptococcus pneumoniae ([place])   . Pressure injury of skin 07/06/2019  . Severe sepsis ([place])   . ADHD 01/27/2019  . History of pulmonary embolism 01/27/2019  . Focal seizure ([place]) 01/27/2019  . Hypomagnesemia 01/25/2019  . Symptomatic anemia   . Thrombocytopenia ([place])   . Leukopenia   . Perforated ear drum, bilateral 01/25/2016  . Multifocal pneumonia 01/23/2016  . Healthcare-associated pneumonia 01/23/2016  . Hypokalemia 01/23/2016  . Anxiety 01/23/2016  . Metastatic lung cancer (metastasis from lung to other site) ([place]) 01/23/2016  . Brain metastasis ([place]) 01/23/2016  . Essential hypertension 01/23/2016   PCP:  Pcp, No Pharmacy:   [facility] [place], [address] [place] [address] Phone: [phone] Fax: [id number] [address], [place] [address] AT [facility] [address] [place] [address] Phone: [phone] Fax: [phone]  [facility] [place], [address] [place] [address] Phone: [phone] Fax: [phone]     Social Determinants of Health (SDOH) Interventions    Readmission Risk Interventions No flowsheet data found.

## 2020-03-26 NOTE — H&P (Signed)
History and Physical    Joshlynn Alfonzo JQZ:[id number] DOB: May 31, 1965 DOA: 03/09/2020  PCP: Pcp, No  Patient coming from: Home.  History obtained from patient's son.  Patient is lethargic.  Chief Complaint: Fall.  HPI: Jermya Dowding is a 55 y.o. female with history of metastatic non-small cell lung cancer being followed at [facility] and recently has been referred to hospice and patient's palliative chemotherapy was stopped in the setting of worsening blood count was brought to the ER because of fall.  Patient fell onto the floor when patient was trying to walk out of the bed.  Per patient's son patient did not hit her head or lose consciousness.  But over the last 2 weeks patient has been increasingly lethargic and hypoxic and short of breath requiring oxygen in [place]im[place].  Patient has not been eating well and has been progressively declining as per the patient's son.  ED Course: In the ER patient is on a nonrebreather chest x-ray shows possible infiltrate CT head and C-spine was done which shows nothing acute intracranially but does show fractures in the C-spine patient does have known history of fracture of the C-spine for which patient is wearing a brace last couple of days was finding it difficult to wear because of the pain.  Labs are significant for WBC of 13.9 hemoglobin 6.8 platelets 7 sodium 130 creatinine 1.4 AST 254 ALT 153.  After discussing with patient's son patient's son stated that patient does not want to be resuscitated and patient's son wants patient to be on comfort measures but okay for blood transfusion.  Review of Systems: As per HPI, rest all negative.   Past Medical History:  Diagnosis Date  . Anxiety   . Asthma   . Cancer ([place])    lung cancer  . COPD (chronic obstructive pulmonary disease) ([place])   . Depression   . GERD (gastroesophageal reflux disease)   . Hypertension   . Migraine     Past Surgical History:  Procedure Laterality Date  . ABDOMINAL HYSTERECTOMY     . CESAREAN SECTION    . ORIF right hip fracture       reports that she has quit smoking. Her smoking use included cigarettes. She has a 8.75 pack-year smoking history. She has never used smokeless tobacco. She reports that she does not drink alcohol and does not use drugs.  Allergies  Allergen Reactions  . Codeine Hives  . Shellfish Allergy Hives    Family History  Problem Relation Age of Onset  . Breast cancer Mother     Prior to Admission medications   Medication Sig Start Date End Date Taking? Authorizing Provider  acetaminophen (TYLENOL) 500 MG tablet Take 500 mg by mouth every 6 (six) hours as needed for mild pain.   Yes [provider]  albuterol (PROVENTIL HFA;VENTOLIN HFA) 108 (90 Base) MCG/ACT inhaler Inhale 1-2 puffs into the lungs every 6 (six) hours as needed for wheezing or shortness of breath.   Yes [provider]  calcium-vitamin D (OSCAL WITH D) 500-200 MG-UNIT tablet Take 1 tablet by mouth 2 (two) times daily.   Yes [provider]  dexamethasone (DECADRON) 2 MG tablet Take 1 tablet by mouth 2 (two) times daily. 10/20/19  Yes [provider]  ELIQUIS 5 MG TABS tablet Take 5 mg by mouth 2 (two) times daily. 10/26/19  Yes [provider]  esomeprazole (NEXIUM) 40 MG capsule Take 40 mg by mouth daily. 12/02/17  Yes [provider]  fentaNYL (DURAGESIC) 100 MCG/HR Place 1 patch onto the skin every 3 (three) days. 07/14/19  Yes Guilford Shi, MD  fentaNYL (DURAGESIC) 25 MCG/HR Place 1 patch onto the skin every 3 (three) days. 02/16/20  Yes [provider]  furosemide (LASIX) 20 MG tablet Take 20 mg by mouth daily as needed for fluid.    Yes [provider]  levETIRAcetam (KEPPRA) 500 MG tablet Take 1 tablet (500 mg total) by mouth 2 (two) times daily. 01/28/19 11/12/19 Yes Dahal, Marlowe Aschoff, MD  lidocaine (LIDODERM) 5 % Place 1-2 patches onto the skin daily as needed (for back pain).    Yes [provider]  lidocaine (XYLOCAINE) 2 % solution Use as directed 15 mLs in the mouth or throat every 6 (six) hours as needed for mouth pain.  11/24/18  Yes [provider]  loratadine (CLARITIN) 10 MG tablet Take 1 tablet (10 mg total) by mouth daily. 11/15/19  Yes Eugenie Filler, MD  LORazepam (ATIVAN) 0.5 MG tablet Take 0.5 mg by mouth 3 (three) times daily as needed for anxiety. 12/11/19  Yes [provider]  metoprolol succinate (TOPROL-XL) 25 MG 24 hr tablet Take 1 tablet (25 mg total) by mouth daily. 11/15/19  Yes Eugenie Filler, MD  mirtazapine (REMERON) 15 MG tablet Take 15 mg by mouth at bedtime. 12/19/19  Yes [provider]  Multiple Vitamin (MULTIVITAMIN WITH MINERALS) TABS tablet Take 1 tablet by mouth daily. 07/12/19  Yes Guilford Shi, MD  nystatin (MYCOSTATIN) 100000 UNIT/ML suspension Take 5 mLs (500,000 Units total) by mouth 4 (four) times daily. 12/30/17  Yes Tacy Learn, PA-C  OLANZapine (ZYPREXA) 5 MG tablet Take 5 mg by mouth 2 (two) times daily.   Yes [provider]  ondansetron (ZOFRAN) 8 MG tablet Take 8 mg by mouth every 8 (eight) hours as needed for nausea or vomiting.  11/01/18  Yes [provider]  oxycodone (ROXICODONE) 30 MG immediate release tablet Take 60 mg by mouth every 3 (three) hours as needed for pain. 02/13/20  Yes [provider]  OXYCONTIN 20 MG 12 hr tablet Take 20 mg by mouth every 6 (six) hours. Along with 80MG  02/19/20  Yes [provider]  OXYCONTIN 80 MG 12 hr tablet Take 80 mg by mouth every 6 (six) hours. Along with 20MG  02/19/20  Yes [provider]  polyethylene glycol (MIRALAX / GLYCOLAX) 17 g packet Take 17 g by mouth daily. 07/12/19  Yes Guilford Shi, MD  senna-docusate (SENOKOT-S) 8.6-50 MG tablet Take 1 tablet by mouth at bedtime. 02/01/20  Yes [provider]  SUMAtriptan (IMITREX) 6 MG/0.5ML SOLN injection Inject 6 mg into the skin every 2 (two)  hours as needed for migraine.  11/30/18  Yes [provider]  albuterol (PROVENTIL) (2.5 MG/3ML) 0.083% nebulizer solution Inhale 2.5 mg into the lungs every 6 (six) hours as needed for shortness of breath or wheezing. 11/10/19   [provider]  chlorpheniramine-HYDROcodone (TUSSIONEX) 10-8 MG/5ML SUER Take 5 mLs by mouth every 12 (twelve) hours as needed for cough. 02/15/20   [provider]  docusate sodium (COLACE) 100 MG capsule Take 1 capsule (100 mg total) by mouth 2 (two) times daily. Patient not taking: No sig reported 07/11/19   Guilford Shi, MD  feeding supplement, ENSURE ENLIVE, (ENSURE ENLIVE) LIQD Take 237 mLs by mouth 2 (two) times daily between meals. Patient not taking: No sig reported 07/12/19   Guilford Shi, MD  fluticasone (FLONASE) 50 MCG/ACT  nasal spray Place 2 sprays into both nostrils daily. Patient not taking: No sig reported 11/15/19   Eugenie Filler, MD  ipratropium (ATROVENT) 0.02 % nebulizer solution Take 2.5 mLs (0.5 mg total) by nebulization 3 (three) times daily. Use 3 times daily x5 days, then every 6 hours as needed. Patient not taking: No sig reported 11/14/19   Eugenie Filler, MD  levalbuterol Penne Lash) 0.63 MG/3ML nebulizer solution Take 3 mLs (0.63 mg total) by nebulization 3 (three) times daily. Use 3 times daily x5 days, then every 6 hours as needed. Patient not taking: No sig reported 11/14/19   Eugenie Filler, MD    Physical Exam: Constitutional: Moderately built and nourished. Vitals:   2020/03/17 0345 03/17/2020 0400 03-17-2020 0415 2020-03-17 0500  BP: 113/78 95/76 112/81 (!) 91/57  Pulse: 88 88 88 91  Resp: (!) 30 (!) 25 (!) 24 (!) 24  Temp:      TempSrc:      SpO2: 100% 100% 100% 100%   Eyes: Anicteric no pallor. ENMT: No discharge from the ears eyes nose or mouth. Neck: No mass or.  No neck rigidity. Respiratory: No rhonchi or crepitations. Cardiovascular: S1-S2 heard. Abdomen: Soft nontender bowel sounds  present. Musculoskeletal: No edema. Skin: No rash. Neurologic: Patient is lethargic answering to her name.  Moving all extremities. Psychiatric: Lethargic.   Labs on Admission: I have personally reviewed following labs and imaging studies  CBC: Recent Labs  Lab 2020-03-17 0038  WBC 13.9*  NEUTROABS 12.8*  HGB 6.8*  HCT 23.2*  MCV 114.3*  PLT 7*   Basic Metabolic Panel: Recent Labs  Lab March 17, 2020 0038  NA 130*  K 4.6  CL 88*  CO2 25  GLUCOSE 94  BUN 70*  CREATININE 1.46*  CALCIUM 7.8*   GFR: CrCl cannot be calculated (Unknown ideal weight.). Liver Function Tests: Recent Labs  Lab 2020-03-17 0038  AST 254*  ALT 153*  ALKPHOS 1,040*  BILITOT 4.0*  PROT 5.4*  ALBUMIN 2.4*   No results for input(s): LIPASE, AMYLASE in the last 168 hours. No results for input(s): AMMONIA in the last 168 hours. Coagulation Profile: No results for input(s): INR, PROTIME in the last 168 hours. Cardiac Enzymes: No results for input(s): CKTOTAL, CKMB, CKMBINDEX, TROPONINI in the last 168 hours. BNP (last 3 results) No results for input(s): PROBNP in the last 8760 hours. HbA1C: No results for input(s): HGBA1C in the last 72 hours. CBG: No results for input(s): GLUCAP in the last 168 hours. Lipid Profile: No results for input(s): CHOL, HDL, LDLCALC, TRIG, CHOLHDL, LDLDIRECT in the last 72 hours. Thyroid Function Tests: No results for input(s): TSH, T4TOTAL, FREET4, T3FREE, THYROIDAB in the last 72 hours. Anemia Panel: No results for input(s): VITAMINB12, FOLATE, FERRITIN, TIBC, IRON, RETICCTPCT in the last 72 hours. Urine analysis:    Component Value Date/Time   COLORURINE YELLOW 07/06/2019 [facility] 07/06/2019 1354   LABSPEC 1.017 07/06/2019 1354   PHURINE 6.0 07/06/2019 1354   GLUCOSEU NEGATIVE 07/06/2019 1354   HGBUR MODERATE (A) 07/06/2019 1354   BILIRUBINUR NEGATIVE 07/06/2019 1354   KETONESUR 80 (A) 07/06/2019 1354   PROTEINUR 100 (A) 07/06/2019 1354    NITRITE NEGATIVE 07/06/2019 1354   LEUKOCYTESUR NEGATIVE 07/06/2019 1354   Sepsis Labs: @LABRCNTIP (procalcitonin:4,lacticidven:4) ) Recent Results (from the past 240 hour(s))  Resp Panel by RT-PCR (Flu A&B, Covid) Nasopharyngeal Swab     Status: None   Collection Time: 03-17-2020  1:02 AM   Specimen: Nasopharyngeal  Swab; Nasopharyngeal(NP) swabs in vial transport medium  Result Value Ref Range Status   SARS Coronavirus 2 by RT PCR NEGATIVE NEGATIVE Final    Comment: (NOTE) SARS-CoV-2 target nucleic acids are NOT DETECTED.  The SARS-CoV-2 RNA is generally detectable in upper respiratory specimens during the acute phase of infection. The lowest concentration of SARS-CoV-2 viral copies this assay can detect is 138 copies/mL. A negative result does not preclude SARS-Cov-2 infection and should not be used as the sole basis for treatment or other patient management decisions. A negative result may occur with  improper specimen collection/handling, submission of specimen other than nasopharyngeal swab, presence of viral mutation(s) within the areas targeted by this assay, and inadequate number of viral copies(<138 copies/mL). A negative result must be combined with clinical observations, patient history, and epidemiological information. The expected result is Negative.  Fact Sheet for Patients:  EntrepreneurPulse.com.au  Fact Sheet for Healthcare Providers:  IncredibleEmployment.be  This test is no t yet approved or cleared by the [place] FDA and  has been authorized for detection and/or diagnosis of SARS-CoV-2 by FDA under an Emergency Use Authorization (EUA). This EUA will remain  in effect (meaning this test can be used) for the duration of the COVID-19 declaration under Section 564(b)(1) of the Act, 21 U.S.C.section 360bbb-3(b)(1), unless the authorization is terminated  or revoked sooner.       Influenza A by PCR NEGATIVE NEGATIVE Final    Influenza B by PCR NEGATIVE NEGATIVE Final    Comment: (NOTE) The Xpert Xpress SARS-CoV-2/FLU/RSV plus assay is intended as an aid in the diagnosis of influenza from Nasopharyngeal swab specimens and should not be used as a sole basis for treatment. Nasal washings and aspirates are unacceptable for Xpert Xpress SARS-CoV-2/FLU/RSV testing.  Fact Sheet for Patients: EntrepreneurPulse.com.au  Fact Sheet for Healthcare Providers: IncredibleEmployment.be  This test is not yet approved or cleared by the [place] FDA and has been authorized for detection and/or diagnosis of SARS-CoV-2 by FDA under an Emergency Use Authorization (EUA). This EUA will remain in effect (meaning this test can be used) for the duration of the COVID-19 declaration under Section 564(b)(1) of the Act, 21 U.S.C. section 360bbb-3(b)(1), unless the authorization is terminated or revoked.  Performed at [facility], [place] [address] [place], [place] [address]      Radiological Exams on Admission: DG Lumbar Spine 2-3 Views  Result Date: 2020-03-03 CLINICAL DATA:  Initial evaluation for acute trauma, fall, low back pain. EXAM: LUMBAR SPINE - 2-3 VIEW COMPARISON:  None. FINDINGS: Five non rib-bearing lumbar type vertebral bodies. Vertebral bodies normally aligned with preservation of the normal lumbar lordosis. No significant listhesis. Mild chronic appearing height loss noted at the superior endplate of L5. Vertebral body height otherwise maintained without visible acute fracture. Sacrum and pelvis intact. SI joints approximated. Underlying osteopenia noted. Moderate degenerative intervertebral disc space narrowing with facet arthrosis present at L5-S1. Intervertebral disc space height otherwise maintained. IM fixation nail partially visualize within the right femoral head. No visible soft tissue injury. IMPRESSION: 1. No radiographic evidence for acute abnormality  within the lumbar spine. 2. Mild chronic height loss at the superior endplate of L5. 3. 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 Electronically Signed   By: Jeannine Boga M.D.   On: 03/03/2020 01:16   CT Head Wo Contrast  Addendum Date: 03-03-2020   ADDENDUM REPORT: 03-03-20 02:13 ADDENDUM: These results were called by telephone at the time of interpretation on Mar 03, 2020 at 2:10 am to  provider [facility] , who verbally acknowledged these results. Electronically Signed   By: Fidela Salisbury MD   On: 03/10/2020 02:13   Result Date: March 10, 2020 CLINICAL DATA:  Metastatic lung cancer, fall, cervical spine fracture EXAM: CT HEAD WITHOUT CONTRAST CT CERVICAL SPINE WITHOUT CONTRAST TECHNIQUE: Multidetector CT imaging of the head and cervical spine was performed following the standard protocol without intravenous contrast. Multiplanar CT image reconstructions of the cervical spine were also generated. COMPARISON:  None. Findings are correlated with prior MRI of the thoracic spine of 11/14/2019 FINDINGS: CT HEAD FINDINGS Brain: Right occipital craniotomy has been performed with a small amount of focal encephalomalacia subjacent to the craniotomy flap. No evidence of acute intracranial hemorrhage or infarct. No abnormal mass effect or midline shift. No abnormal intra or extra-axial mass lesion or fluid collection. Ventricular size is normal. Cerebellum is unremarkable. Vascular: No asymmetric hyperdense vasculature at the skull base. Skull: Lytic lesions are seen involving the a skull base adjacent to the jugular foramen and intimately of all vein the left stylomastoid foramen and right occipital condyle compatible with metastatic osseous implants in this patient with known metastatic lung cancer. Additional metastatic implants noted within the right clivus subjacent to the foramen lacerum. No acute fracture. Sinuses/Orbits: The paranasal sinuses are clear. The orbits are unremarkable. Other: Mastoid air  cells and middle ear cavities are clear. CT CERVICAL SPINE FINDINGS Alignment: Normal cervical lordosis.  No listhesis. Skull base and vertebrae: Multiple osseous lytic lesions are identified: There is a lytic lesion involving the right occipital condyle without associated pathologic fracture. Lytic lesion is seen within the body of C2 extending into the right lateral body of C2 and right pedicle of C2. There is associated pathologic fracture of the right body of C2 with compression and impingement of the a transverse foramen. Lytic lesion within the body of C3 without associated compression. Lytic lesion within the body of C4 with associated right paracentral compression fracture without associated retropulsion. The posterior elements appear intact. Lytic lesion within the body of C5 with left paracentral compression fracture with approximately 80% loss of height. The posterior elements are not involved. Mixed lytic and sclerotic metastasis within the body of C7 without associated pathologic fracture. Sclerotic metastases are again seen within the T3 and T4 vertebral bodies with associated compression fractures which appear progressive since prior MRI examination. Minimal retropulsion at T4, similar to prior examination. The spinal canal appears widely patent. Soft tissues and spinal canal: No prevertebral fluid or swelling. No visible canal hematoma. Disc levels: Intervertebral disc spaces are preserved. The paraspinal soft tissues are unremarkable. Upper chest: Multiple osseous metastases are seen within the a visualized thoracic cage and thoracic spine. Masslike consolidation noted within the right apex. Other: Left internal jugular chest port partially visualized. IMPRESSION: No acute intracranial abnormality.  No calvarial fracture. Widespread osseous metastatic disease within the skull base and cervical spine. Pathologic fractures of the a right lateral body of C2, C4, and C5 which appear new since prior MRI  examination. Fracture of C2 impinges the transverse foramen though the adjacent vertebral artery is not well assessed on this noncontrast examination. Lack of surrounding soft tissue swelling at this level suggests that this may be subacute to remote in etiology. No associated listhesis. Attempts are being made at this time to contact the managing clinician for direct communication of the critical findings. Electronically Signed: By: Fidela Salisbury MD On: 03-10-20 02:04   CT Cervical Spine Wo Contrast  Addendum Date: 03-10-20  ADDENDUM REPORT: March 13, 2020 02:13 ADDENDUM: These results were called by telephone at the time of interpretation on 03-13-20 at 2:10 am to provider [facility] , who verbally acknowledged these results. Electronically Signed   By: Fidela Salisbury MD   On: 03-13-20 02:13   Result Date: March 13, 2020 CLINICAL DATA:  Metastatic lung cancer, fall, cervical spine fracture EXAM: CT HEAD WITHOUT CONTRAST CT CERVICAL SPINE WITHOUT CONTRAST TECHNIQUE: Multidetector CT imaging of the head and cervical spine was performed following the standard protocol without intravenous contrast. Multiplanar CT image reconstructions of the cervical spine were also generated. COMPARISON:  None. Findings are correlated with prior MRI of the thoracic spine of 11/14/2019 FINDINGS: CT HEAD FINDINGS Brain: Right occipital craniotomy has been performed with a small amount of focal encephalomalacia subjacent to the craniotomy flap. No evidence of acute intracranial hemorrhage or infarct. No abnormal mass effect or midline shift. No abnormal intra or extra-axial mass lesion or fluid collection. Ventricular size is normal. Cerebellum is unremarkable. Vascular: No asymmetric hyperdense vasculature at the skull base. Skull: Lytic lesions are seen involving the a skull base adjacent to the jugular foramen and intimately of all vein the left stylomastoid foramen and right occipital condyle compatible with metastatic  osseous implants in this patient with known metastatic lung cancer. Additional metastatic implants noted within the right clivus subjacent to the foramen lacerum. No acute fracture. Sinuses/Orbits: The paranasal sinuses are clear. The orbits are unremarkable. Other: Mastoid air cells and middle ear cavities are clear. CT CERVICAL SPINE FINDINGS Alignment: Normal cervical lordosis.  No listhesis. Skull base and vertebrae: Multiple osseous lytic lesions are identified: There is a lytic lesion involving the right occipital condyle without associated pathologic fracture. Lytic lesion is seen within the body of C2 extending into the right lateral body of C2 and right pedicle of C2. There is associated pathologic fracture of the right body of C2 with compression and impingement of the a transverse foramen. Lytic lesion within the body of C3 without associated compression. Lytic lesion within the body of C4 with associated right paracentral compression fracture without associated retropulsion. The posterior elements appear intact. Lytic lesion within the body of C5 with left paracentral compression fracture with approximately 80% loss of height. The posterior elements are not involved. Mixed lytic and sclerotic metastasis within the body of C7 without associated pathologic fracture. Sclerotic metastases are again seen within the T3 and T4 vertebral bodies with associated compression fractures which appear progressive since prior MRI examination. Minimal retropulsion at T4, similar to prior examination. The spinal canal appears widely patent. Soft tissues and spinal canal: No prevertebral fluid or swelling. No visible canal hematoma. Disc levels: Intervertebral disc spaces are preserved. The paraspinal soft tissues are unremarkable. Upper chest: Multiple osseous metastases are seen within the a visualized thoracic cage and thoracic spine. Masslike consolidation noted within the right apex. Other: Left internal jugular chest  port partially visualized. IMPRESSION: No acute intracranial abnormality.  No calvarial fracture. Widespread osseous metastatic disease within the skull base and cervical spine. Pathologic fractures of the a right lateral body of C2, C4, and C5 which appear new since prior MRI examination. Fracture of C2 impinges the transverse foramen though the adjacent vertebral artery is not well assessed on this noncontrast examination. Lack of surrounding soft tissue swelling at this level suggests that this may be subacute to remote in etiology. No associated listhesis. Attempts are being made at this time to contact the managing clinician for direct communication of the critical findings.  Electronically Signed: By: Fidela Salisbury MD On: 03/01/20 02:04   DG Chest Port 1 View  Result Date: 2020-03-01 CLINICAL DATA:  Initial evaluation for acute cough, history of lung cancer. EXAM: PORTABLE CHEST 1 VIEW COMPARISON:  Prior CT from 11/11/2019 FINDINGS: Left-sided Port-A-Cath in place with tip overlying the distal SVC. Heart size within normal limits. Mediastinal silhouette normal. Lungs hypoinflated. Approximate 4.5 cm nodular density overlying the mid left lung consistent with known metastatic disease. Few additional pleural based and/or osseous metastases noted, also seen on prior CT. Irregular pleuroparenchymal scarring and/or architectural distortion present at the right perihilar region. Patchy and hazy opacity at the right lung base could reflect atelectasis or infiltrate. No pulmonary edema or pleural effusion. No pneumothorax. Probable healing pathologic fracture noted at the right lower ribs. IMPRESSION: 1. Patchy and hazy opacity at the right lung base, which could reflect atelectasis or infiltrate. 2. 4.5 cm nodular density overlying the mid left lung, consistent with known metastatic disease. Few additional pleural based and/or osseous metastases noted as well, also seen on prior CT. 3. Probable healing  pathologic fracture at the right lower ribs. Electronically Signed   By: Jeannine Boga M.D.   On: 03-01-2020 01:45     Assessment/Plan Principal Problem:   Failure to thrive in adult Active Problems:   Metastatic lung cancer (metastasis from lung to other site) [facility])   Essential hypertension   Symptomatic anemia   Thrombocytopenia ([place])    1. Failure to thrive with respiratory failure with hypoxia requiring nonrebreather with known history of metastatic non-small cell lung cancer presently patient's family requesting comfort measures.  Okay for transfusion.  Fentanyl as needed along with patient's long-acting pain medication has been ordered palliative care has been consulted. 2. Worsening anemia and thrombocytopenia likely related to metastatic cancer for which PRBC transfusion and platelet transfusion has been ordered. 3. Hypertension on metoprolol.  At this time after discussing with patient's son plan is to admit patient for comfort measures and consult palliative care.  Likely could be terminal at this time will need inpatient status.    DVT prophylaxis: SCDs.  Has severe thrombocytopenia. Code Status: DNR confirmed with patient's son. Family Communication: Patient's son. Disposition Plan: To be determined. Consults called: Palliative care. Admission status: Inpatient.   Rise Patience MD [facility] Pager [phone].  If 7PM-7AM, please contact night-coverage [email or web address] Password [facility]  2020-03-01, 5:36 AM

## 2020-03-26 NOTE — Assessment & Plan Note (Addendum)
-  Originally diagnosed 2017.  Patient has undergone aggressive treatment since diagnosis however cancer has persisted and her functional status has continued to decline -At this time, given severe poor oral intake, cachectic state, frailty, failure to thrive, she appears to me to be approaching end-of-life with less than 2 weeks; this has been discussed in detail with her sons bedside who are in agreement with pursuing comfort care at this time and discontinuing further transfusions and unnecessary medications with focus now being comfort -Fentanyl and Ativan ordered for comfort use - given NRB in place do not think she is safe for d/c home with hospice; will see if we can place her in [facility] for end of life care  -Indwelling Foley for comfort appropriate

## 2020-03-26 DEATH — deceased
